# Patient Record
Sex: Female | Born: 1945 | Race: White | Hispanic: No | Smoking: Never smoker
Health system: Southern US, Community
[De-identification: ages and names within clinical notes are randomized; demographics above are authoritative.]

## PROBLEM LIST (undated history)

## (undated) DIAGNOSIS — K219 Gastro-esophageal reflux disease without esophagitis: Secondary | ICD-10-CM

## (undated) DIAGNOSIS — N824 Other female intestinal-genital tract fistulae: Secondary | ICD-10-CM

## (undated) DIAGNOSIS — G43909 Migraine, unspecified, not intractable, without status migrainosus: Secondary | ICD-10-CM

## (undated) DIAGNOSIS — K5790 Diverticulosis of intestine, part unspecified, without perforation or abscess without bleeding: Secondary | ICD-10-CM

## (undated) DIAGNOSIS — G8929 Other chronic pain: Secondary | ICD-10-CM

## (undated) DIAGNOSIS — Z8719 Personal history of other diseases of the digestive system: Secondary | ICD-10-CM

## (undated) DIAGNOSIS — Z9289 Personal history of other medical treatment: Secondary | ICD-10-CM

## (undated) DIAGNOSIS — R112 Nausea with vomiting, unspecified: Secondary | ICD-10-CM

## (undated) DIAGNOSIS — Z8489 Family history of other specified conditions: Secondary | ICD-10-CM

## (undated) DIAGNOSIS — Z8711 Personal history of peptic ulcer disease: Secondary | ICD-10-CM

## (undated) DIAGNOSIS — B9562 Methicillin resistant Staphylococcus aureus infection as the cause of diseases classified elsewhere: Secondary | ICD-10-CM

## (undated) DIAGNOSIS — L0390 Cellulitis, unspecified: Secondary | ICD-10-CM

## (undated) DIAGNOSIS — Z8679 Personal history of other diseases of the circulatory system: Secondary | ICD-10-CM

## (undated) DIAGNOSIS — H25019 Cortical age-related cataract, unspecified eye: Secondary | ICD-10-CM

## (undated) DIAGNOSIS — Z9889 Other specified postprocedural states: Secondary | ICD-10-CM

## (undated) DIAGNOSIS — I773 Arterial fibromuscular dysplasia: Secondary | ICD-10-CM

## (undated) DIAGNOSIS — F419 Anxiety disorder, unspecified: Secondary | ICD-10-CM

## (undated) DIAGNOSIS — R011 Cardiac murmur, unspecified: Secondary | ICD-10-CM

## (undated) DIAGNOSIS — Z8614 Personal history of Methicillin resistant Staphylococcus aureus infection: Secondary | ICD-10-CM

## (undated) DIAGNOSIS — G3184 Mild cognitive impairment, so stated: Secondary | ICD-10-CM

## (undated) DIAGNOSIS — N6019 Diffuse cystic mastopathy of unspecified breast: Secondary | ICD-10-CM

## (undated) DIAGNOSIS — E041 Nontoxic single thyroid nodule: Secondary | ICD-10-CM

## (undated) DIAGNOSIS — M1990 Unspecified osteoarthritis, unspecified site: Secondary | ICD-10-CM

## (undated) DIAGNOSIS — N904 Leukoplakia of vulva: Secondary | ICD-10-CM

## (undated) DIAGNOSIS — I10 Essential (primary) hypertension: Secondary | ICD-10-CM

## (undated) HISTORY — PX: OTHER SURGICAL HISTORY: SHX169

## (undated) HISTORY — PX: EYE SURGERY: SHX253

## (undated) HISTORY — PX: COLON SURGERY: SHX602

## (undated) HISTORY — PX: BREAST EXCISIONAL BIOPSY: SUR124

## (undated) HISTORY — PX: WRIST GANGLION EXCISION: SUR520

---

## 1965-11-01 HISTORY — PX: OTHER SURGICAL HISTORY: SHX169

## 1996-11-01 DIAGNOSIS — Z9289 Personal history of other medical treatment: Secondary | ICD-10-CM

## 1996-11-01 HISTORY — DX: Personal history of other medical treatment: Z92.89

## 1996-11-01 HISTORY — PX: ABDOMINAL HYSTERECTOMY: SHX81

## 2001-11-29 ENCOUNTER — Other Ambulatory Visit: Admission: RE | Admit: 2001-11-29 | Discharge: 2001-11-29 | Payer: Self-pay | Admitting: Family Medicine

## 2005-03-30 ENCOUNTER — Ambulatory Visit: Payer: Self-pay | Admitting: Internal Medicine

## 2005-03-30 IMAGING — MG MM CAD SCREENING MAMMO
1 series · 4 of 4 positions shown · non-contrast
Comparison: none

REASON FOR EXAM: Screening
COMMENTS:

[R CC · right · 4 of 4 slices shown]
[im 1/4]
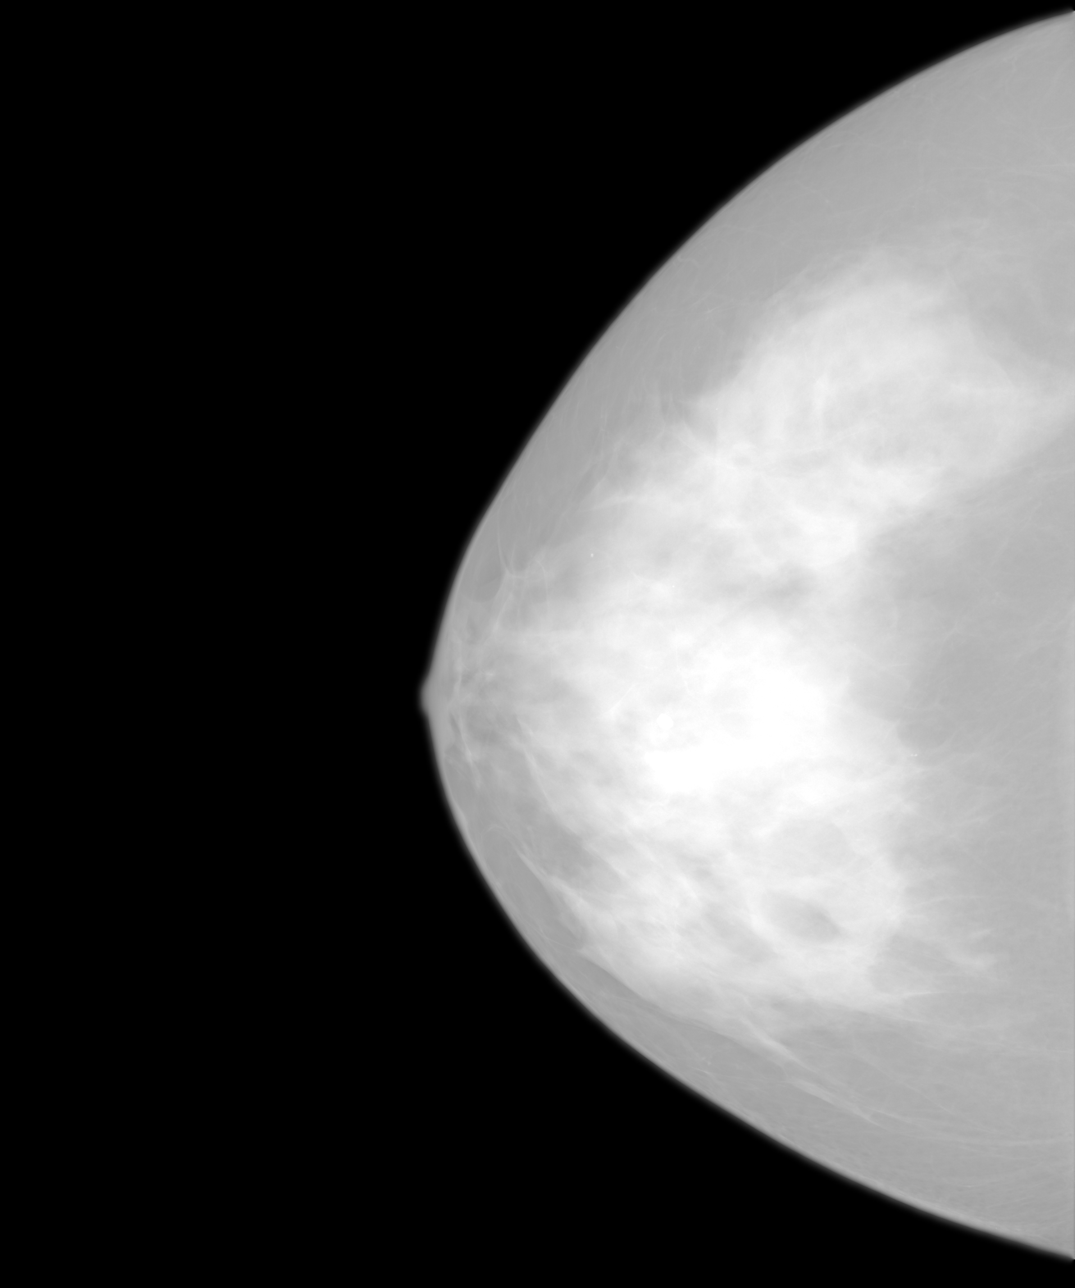
[im 2/4]
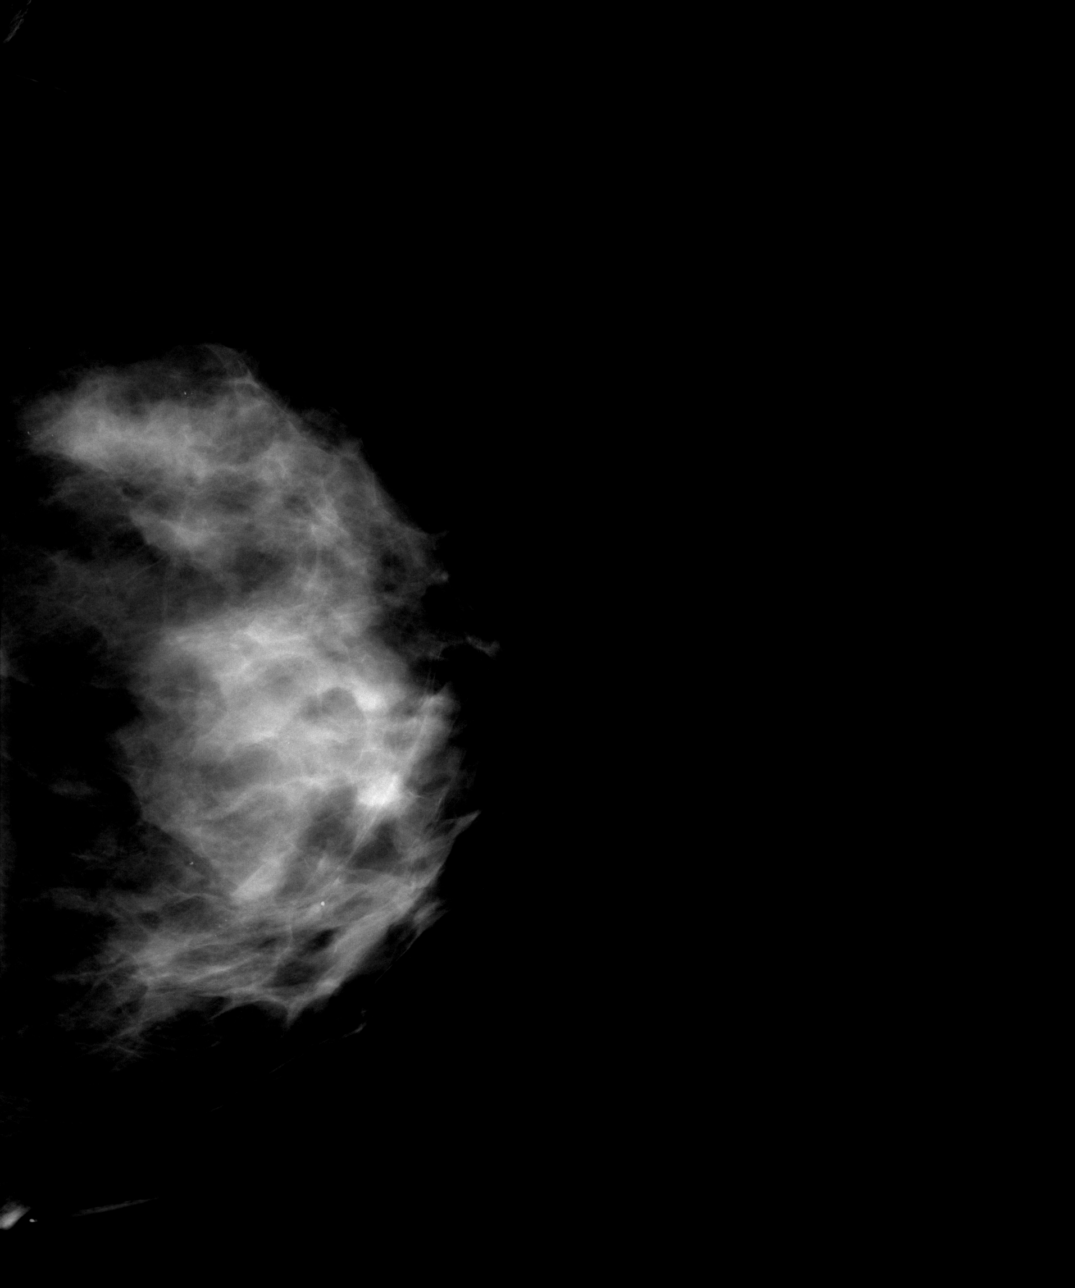
[im 3/4]
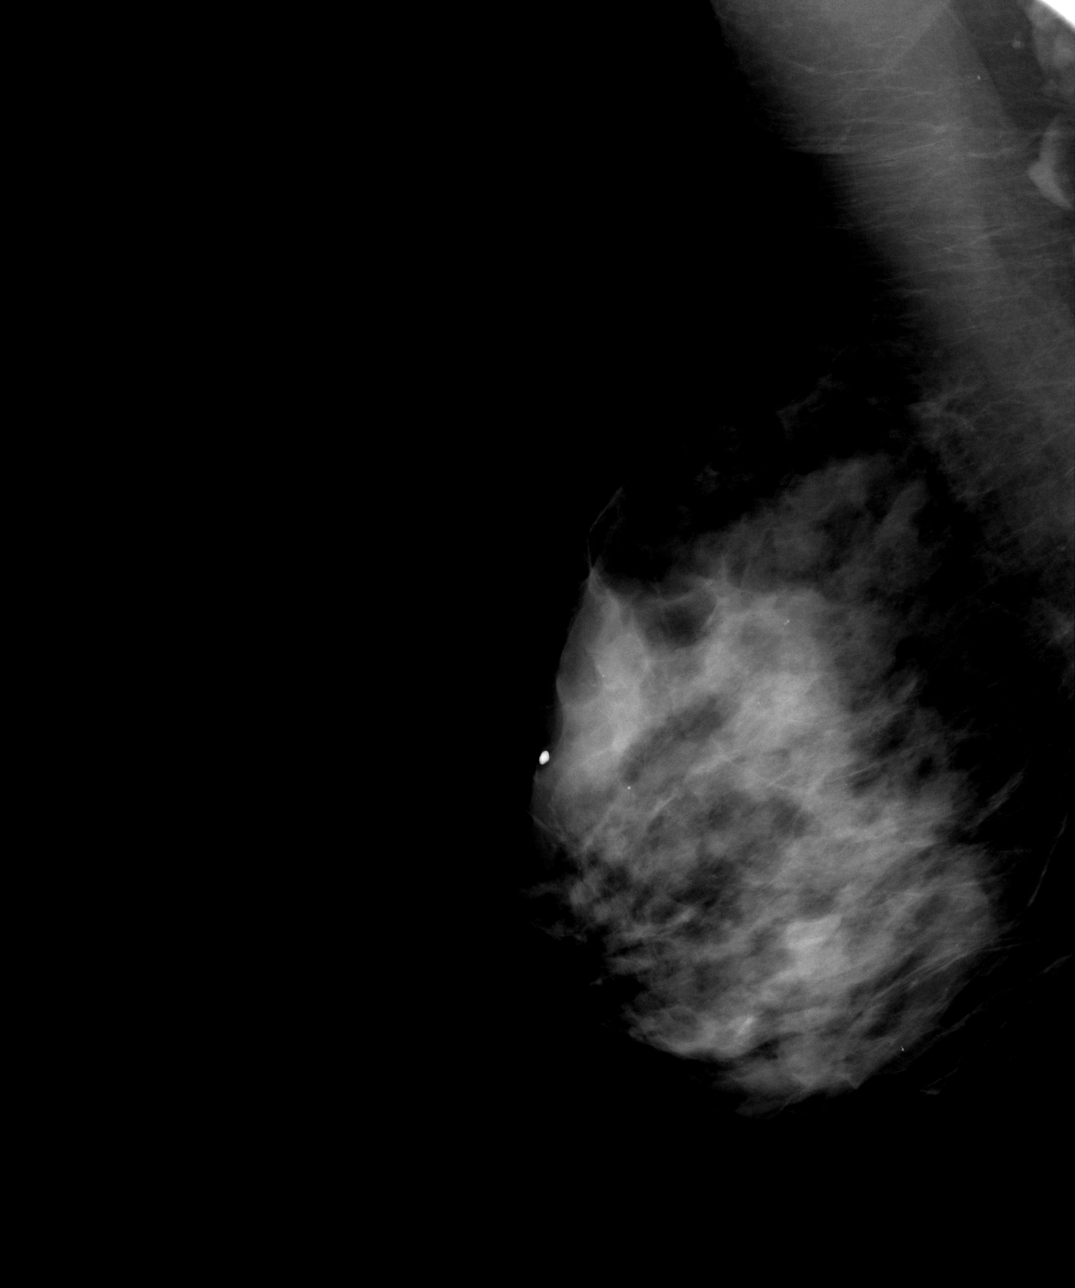
[im 4/4]
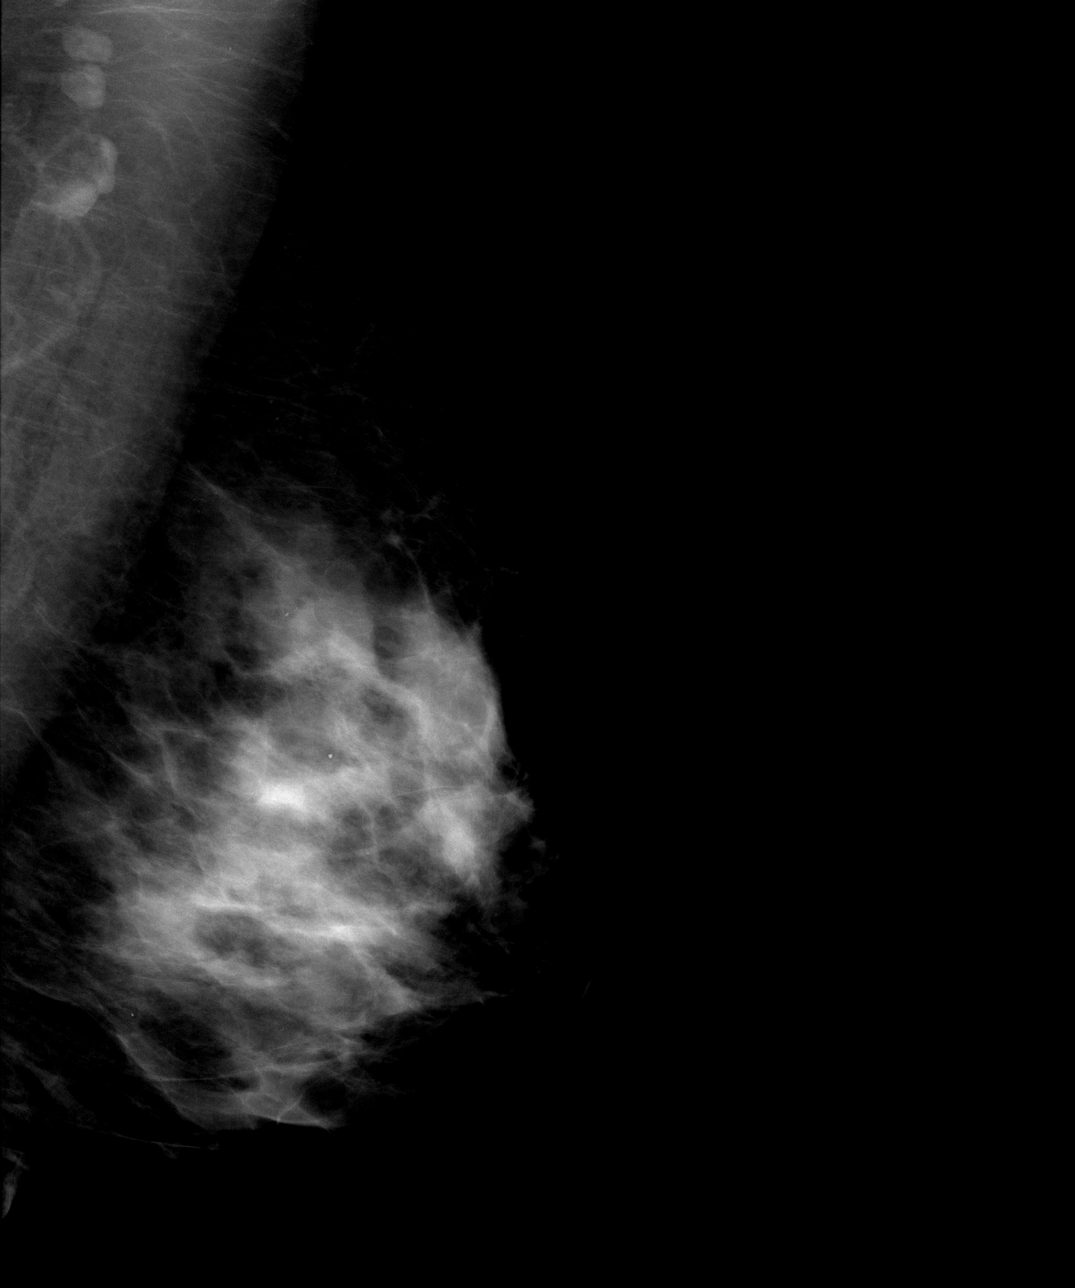

[4 of 4 positions shown; findings below may reference images not displayed]

PROCEDURE:     MAM - MAM DGTL SCREENING MAMMO W/CAD  - [DATE]  [DATE]

RESULT:     The patient is 59 years old. She has a weak family but no
personal history of breast cancer. She does not take hormones or have
complaints today.

The mammogram was performed and compared with prior studies from [DATE]
and [DATE].

The breasts show a mixture of dense fibroglandular tissue and fatty
replacement. Scattered, benign calcifications are again identified in both
breasts. They are unchanged from previous studies. There is no evidence of a
dominant stellate mass, architectural distortion or microcalcification to
suspect malignancy.
IMPRESSION: Stable mammogram. No change since prior studies.

BI-RADS:  Category 2 - Benign Imaging Findings

RECOMMENDATION:  The patient should return for bilateral screening mammogram
in 1 year.

A NEGATIVE MAMMOGRAM REPORT DOES NOT PRECLUDE BIOPSY OR OTHER EVALUATION OF
CLINICALLY PALPABLE OR OTHERWISE SUSPICIOUS MASS OR LESION.  BREAST CANCER
MAY NOT BE DETECTED BY MAMMOGRAPHY IN UP TO 10% OF CASES.

## 2005-04-01 ENCOUNTER — Ambulatory Visit: Payer: Self-pay | Admitting: Gastroenterology

## 2006-07-07 ENCOUNTER — Ambulatory Visit: Payer: Self-pay | Admitting: Internal Medicine

## 2006-07-07 IMAGING — MG MM CAD SCREENING MAMMO
1 series · 4 of 4 positions shown · non-contrast
Comparison: none

REASON FOR EXAM: screening mammo
COMMENTS:

[Series 7854: R CC · right · 4 of 4 slices shown]
[im 1/4]
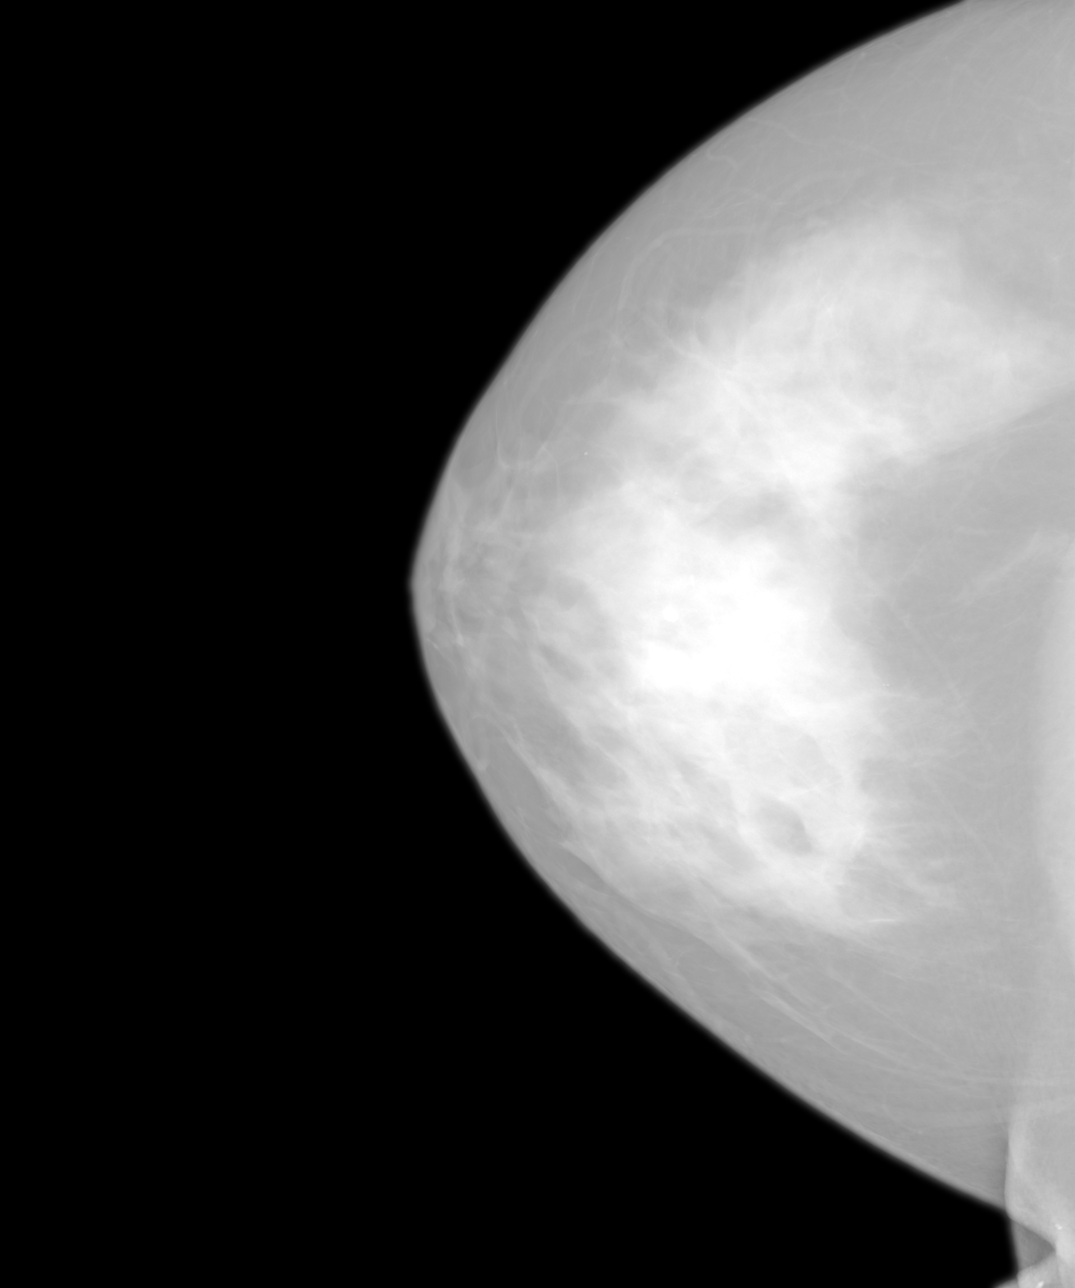
[im 2/4]
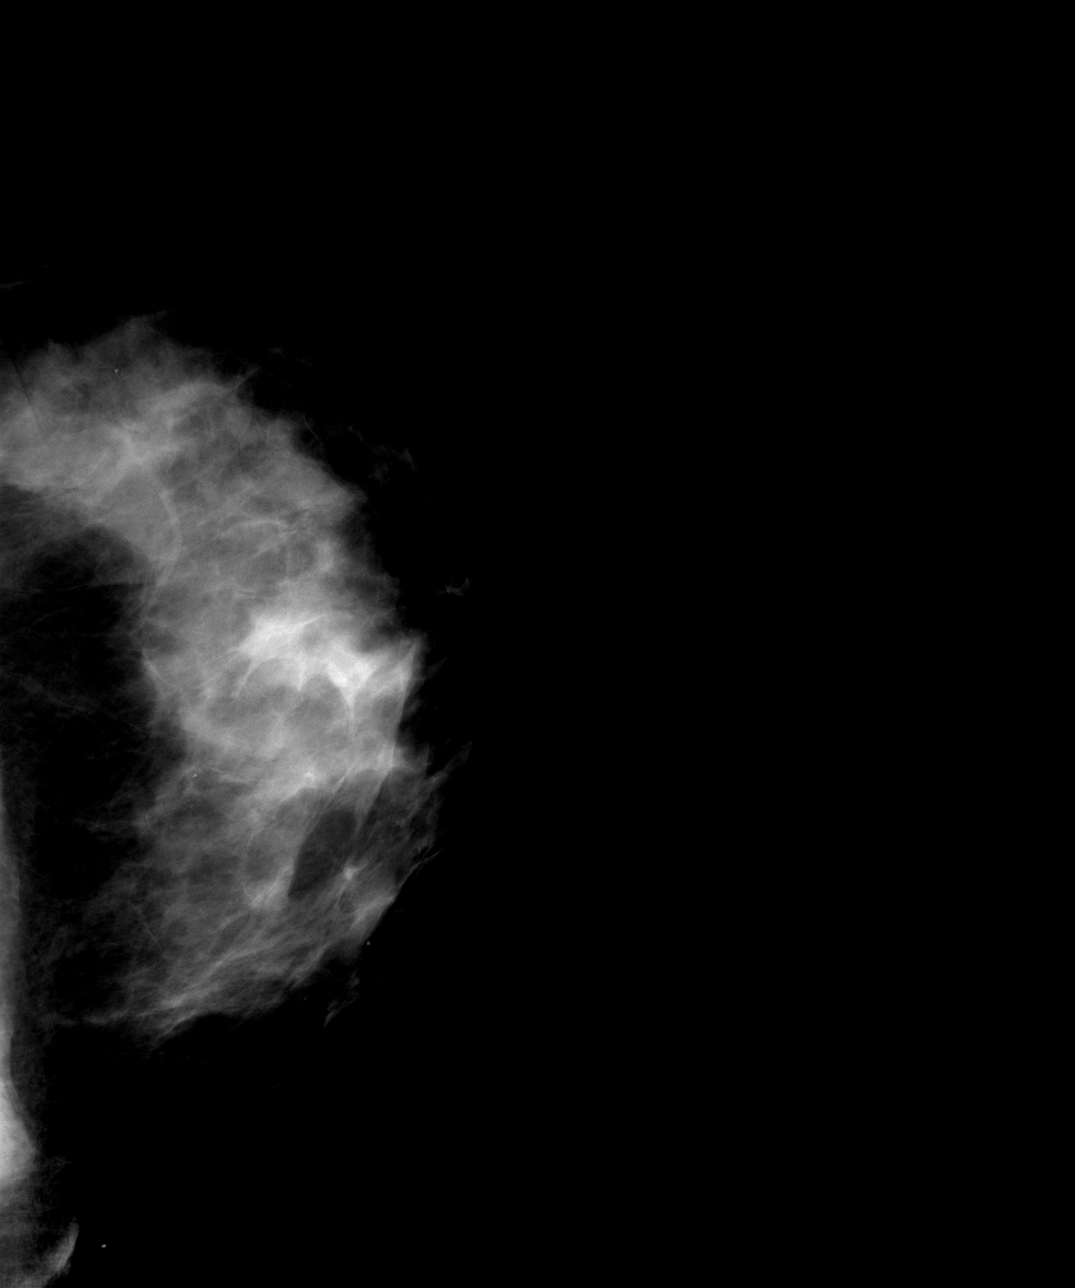
[im 3/4]
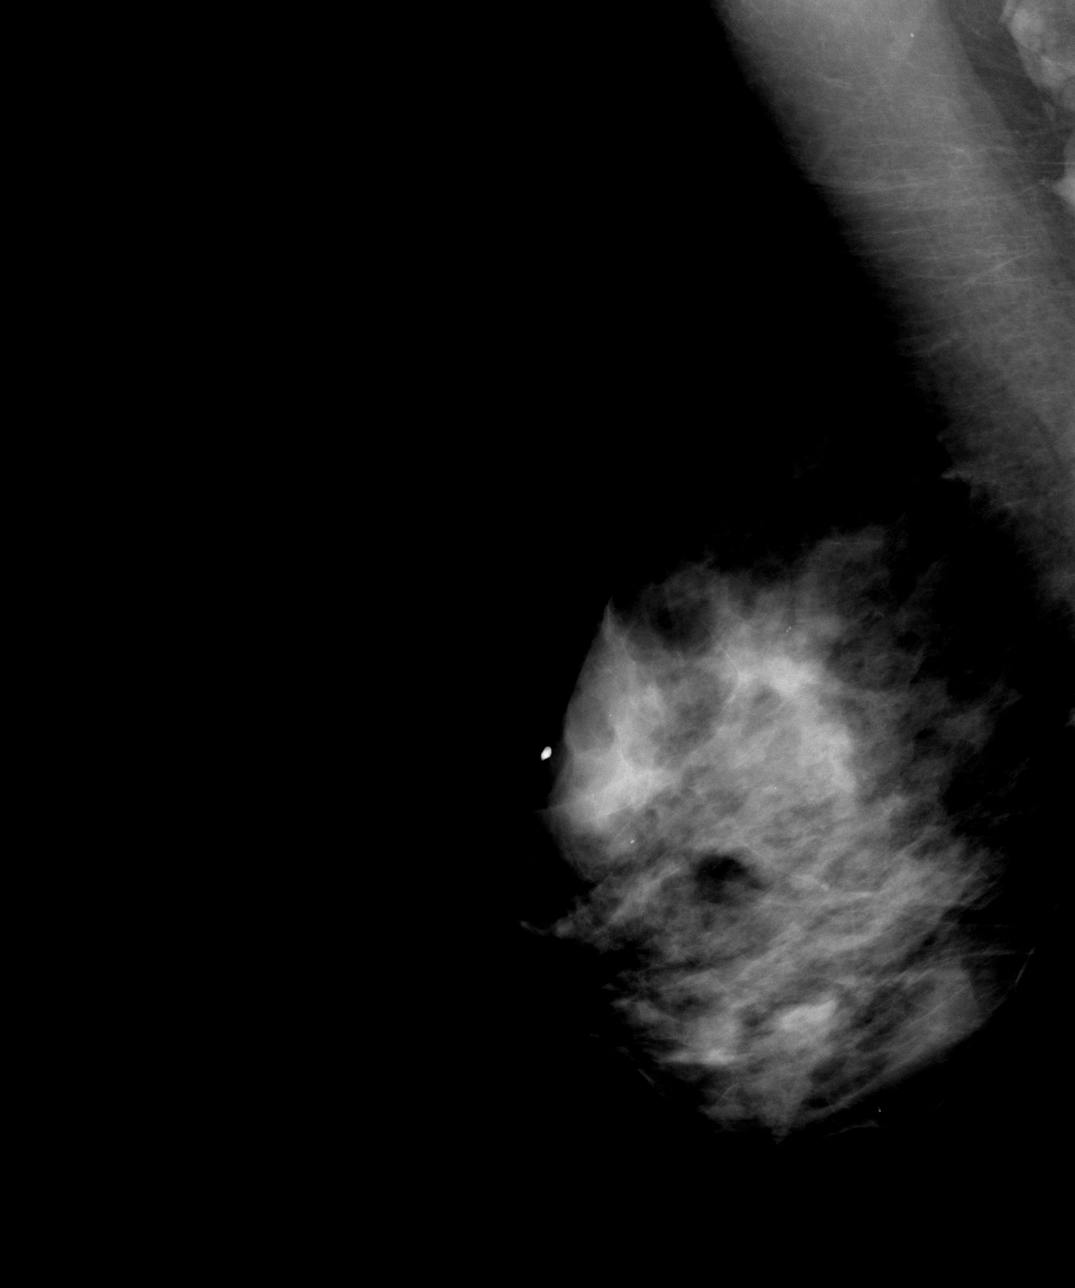
[im 4/4]
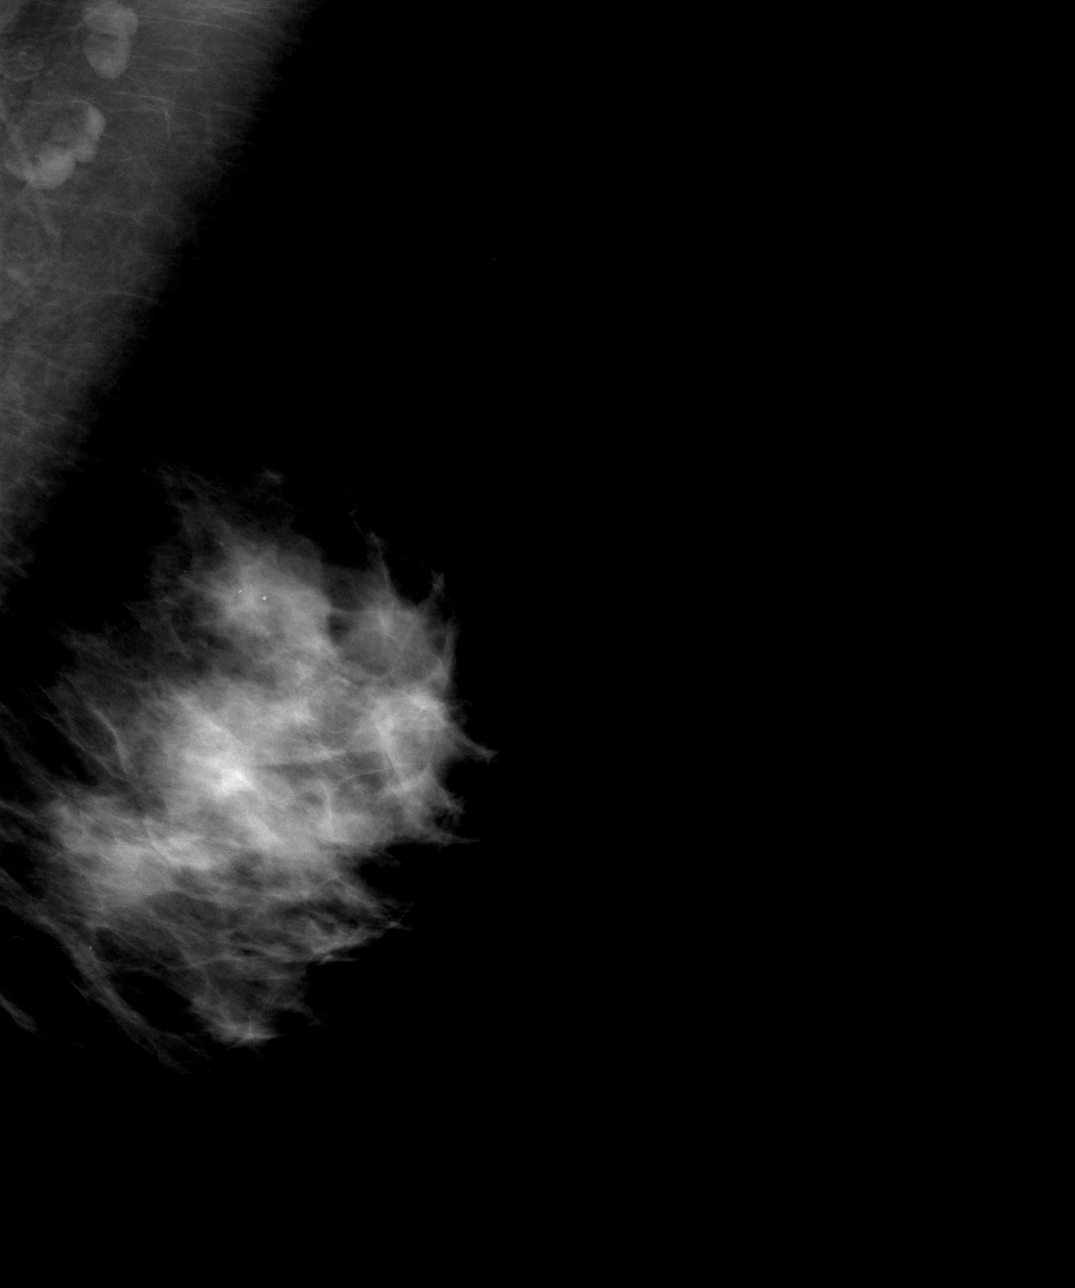

[4 of 4 positions shown; findings below may reference images not displayed]

PROCEDURE:     MAM - MAM DGTL SCREENING MAMMO W/CAD  - [DATE]  [DATE]

RESULT:     Comparison is made to the study of film screen study of [DATE]
and to digital images done previously on [DATE] and [DATE].  The breasts
exhibit a dense parenchymal pattern.  There is no evidence of an area of
developing density, dominant mass or malignant-appearing calcification.  The
appearance is stable.
IMPRESSION: Stable, benign-appearing bilateral mammogram.

BI-RADS Category 2  Benign Findings.  Please continue to encourage annual
mammographic followup..

A NEGATIVE MAMMOGRAM REPORT DOES NOT PRECLUDE BIOPSY OR OTHER EVALUATION OF
A CLNICALLY PALPABLE OR OTHERWISE SUSPICIOUS MASS OR LESION.  BREAST CANCER
MAY NOT BE DETECTED BY MAMMOGRAPHY IN UP TO 10% OF CASES.

## 2007-09-13 ENCOUNTER — Ambulatory Visit: Payer: Self-pay | Admitting: Internal Medicine

## 2007-09-13 IMAGING — MG MM CAD SCREENING MAMMO
1 series · 4 of 4 positions shown · non-contrast
Comparison: none

REASON FOR EXAM: scr mammo
COMMENTS:

[Series 2327: R CC · right · 4 of 4 slices shown]
[im 1/4]
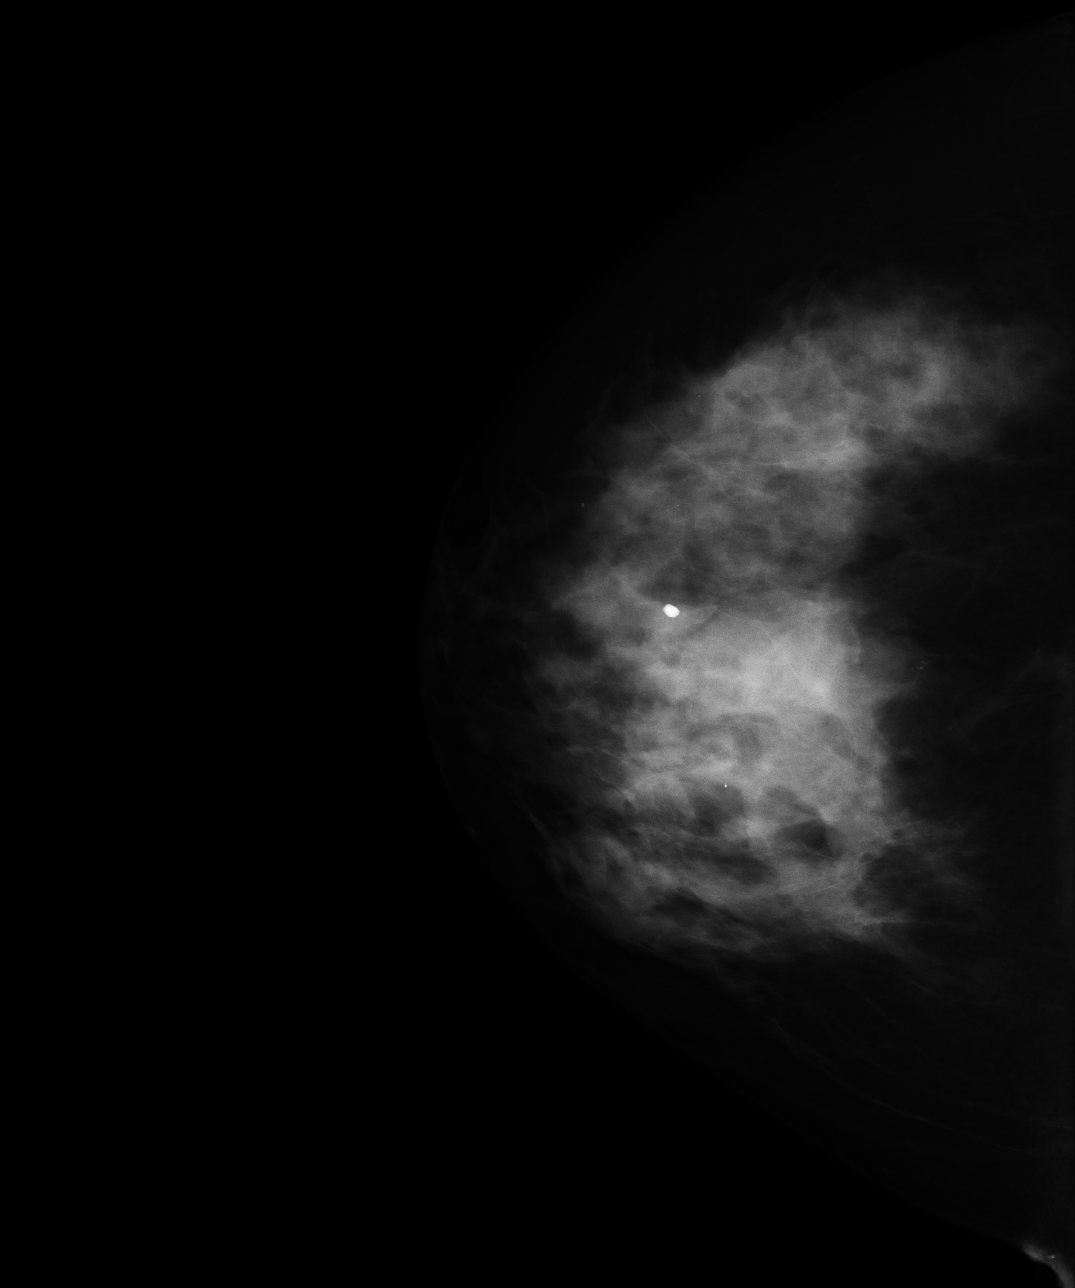
[im 2/4]
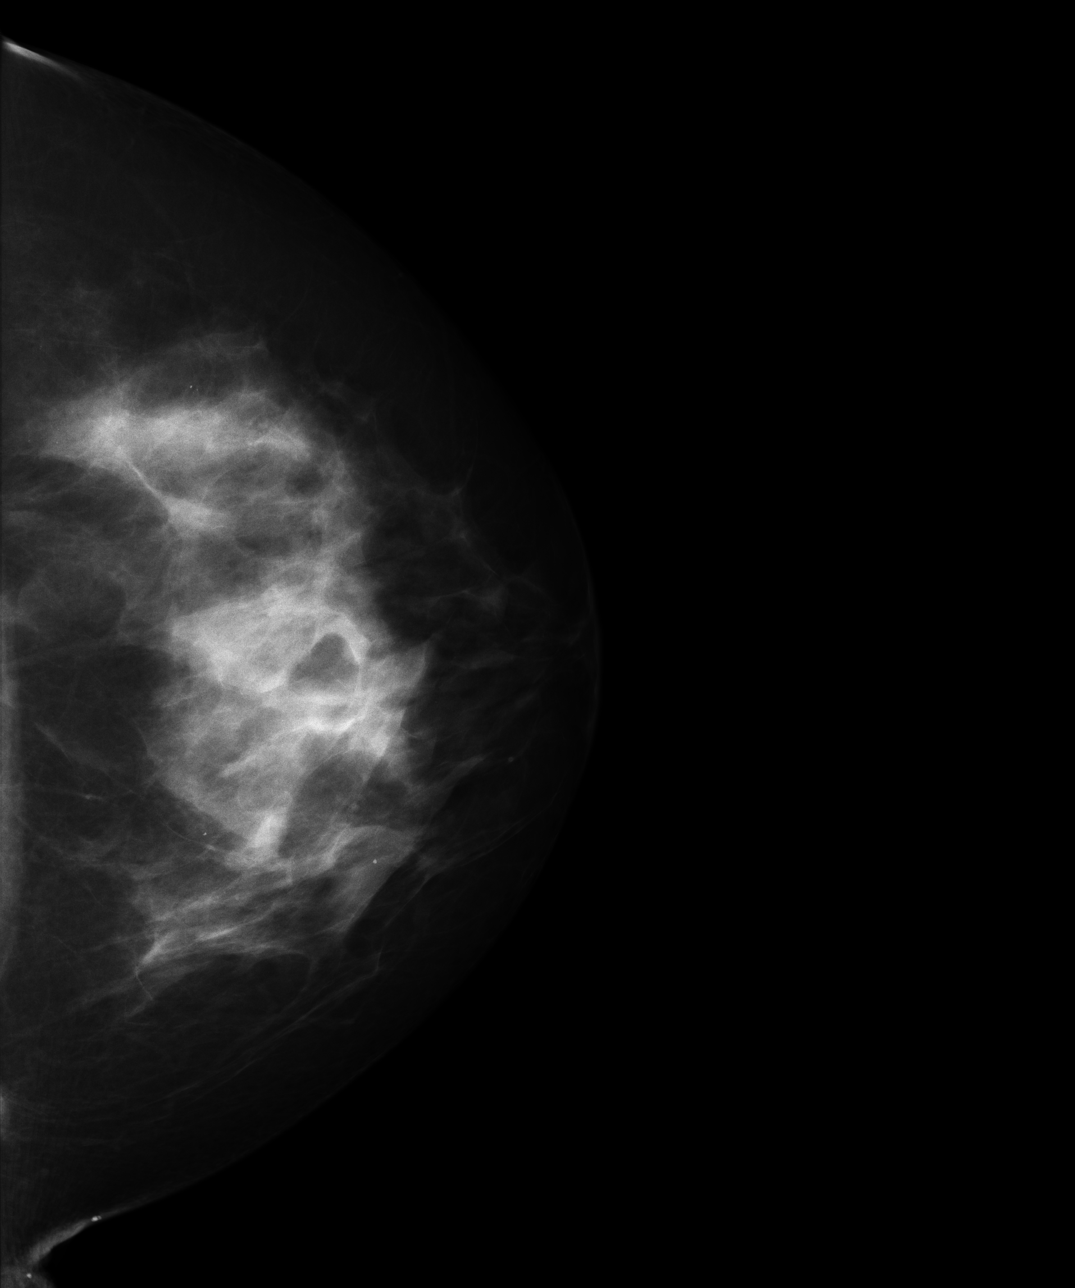
[im 3/4]
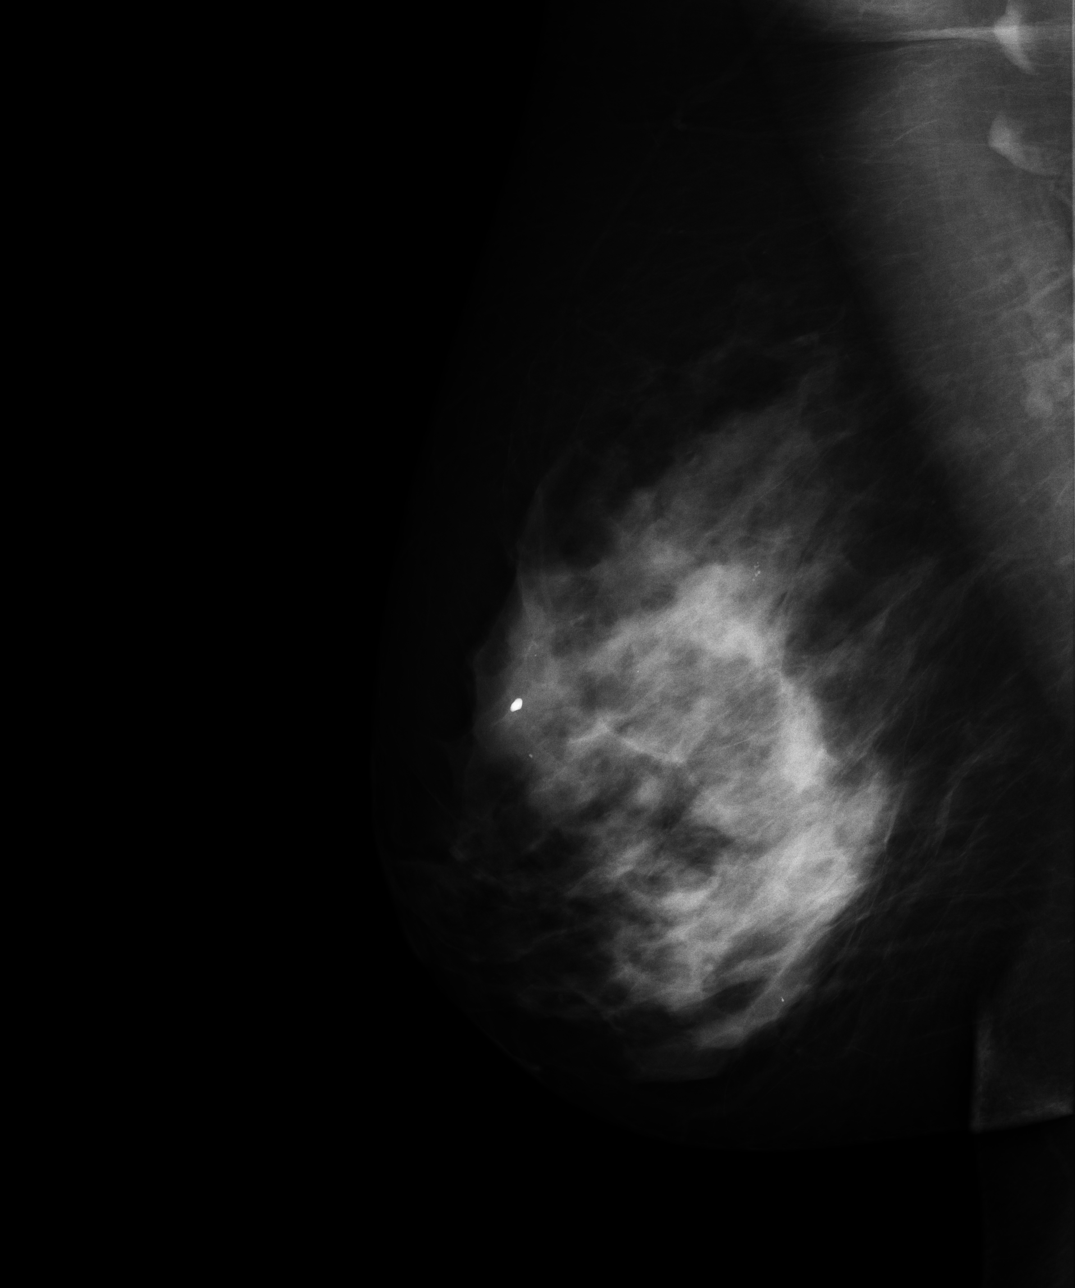
[im 4/4]
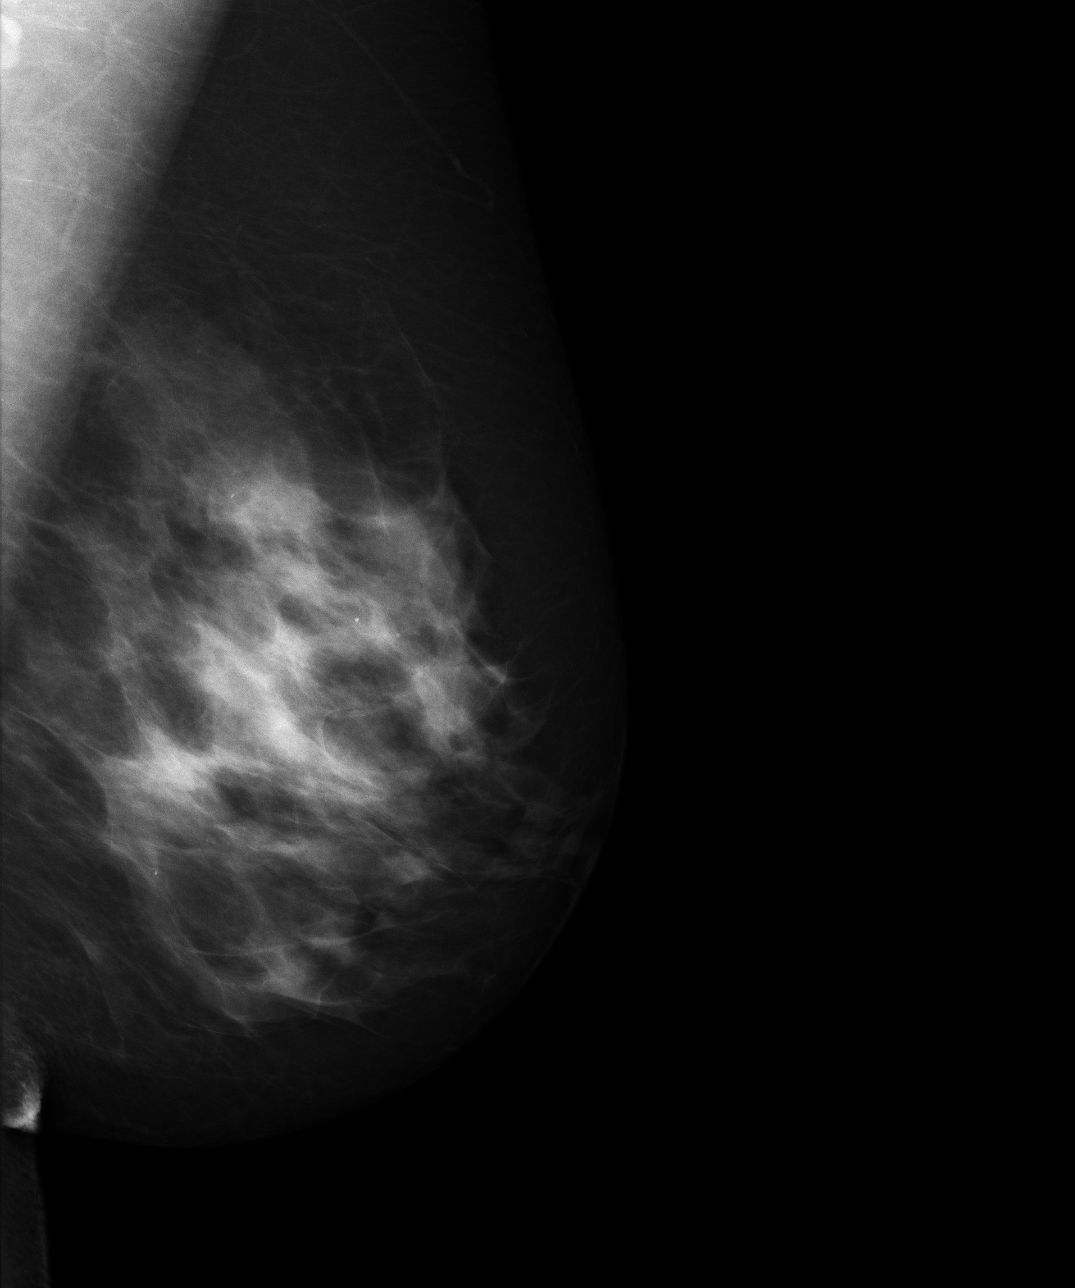

[4 of 4 positions shown; findings below may reference images not displayed]

PROCEDURE:     MAM - MAM DGTL SCREENING MAMMO W/CAD  - [DATE]  [DATE]

RESULT:     Comparison is made to film screening images of [DATE] and to
digital images of [DATE], as well as [DATE] and [DATE].  The breasts
exhibit a dense parenchymal pattern with no evidence of an area of
developing density, dominant mass or malignant appearing calcification. The
appearance is stable.
IMPRESSION: 1)Stable, benign appearing bilateral mammogram.

BI-RADS: Category 2-Benign Finding (s)

RECOMMENDATIONS

1)Please continue to encourage annual mammographic follow-up.

A NEGATIVE MAMMOGRAM REPORT DOES NOT PRECLUDE BIOPSY OR OTHER EVALUATION OF
A CLINICALLY PALPABLE OR OTHERWISE SUSPICIOUS MASS OR LESION. BREAST CANCER
MAY NOT BE DETECTED BY MAMMOGRAPHY IN UP TO 10% OF CASES.

## 2007-11-02 HISTORY — PX: OTHER SURGICAL HISTORY: SHX169

## 2008-09-17 ENCOUNTER — Ambulatory Visit: Payer: Self-pay | Admitting: Internal Medicine

## 2008-09-17 IMAGING — MG MM CAD SCREENING MAMMO
1 series · 4 of 4 positions shown · non-contrast
Comparison: none

REASON FOR EXAM: Screening mammogram
COMMENTS:

[R CC · right · 4 of 4 slices shown]
[im 1/4]
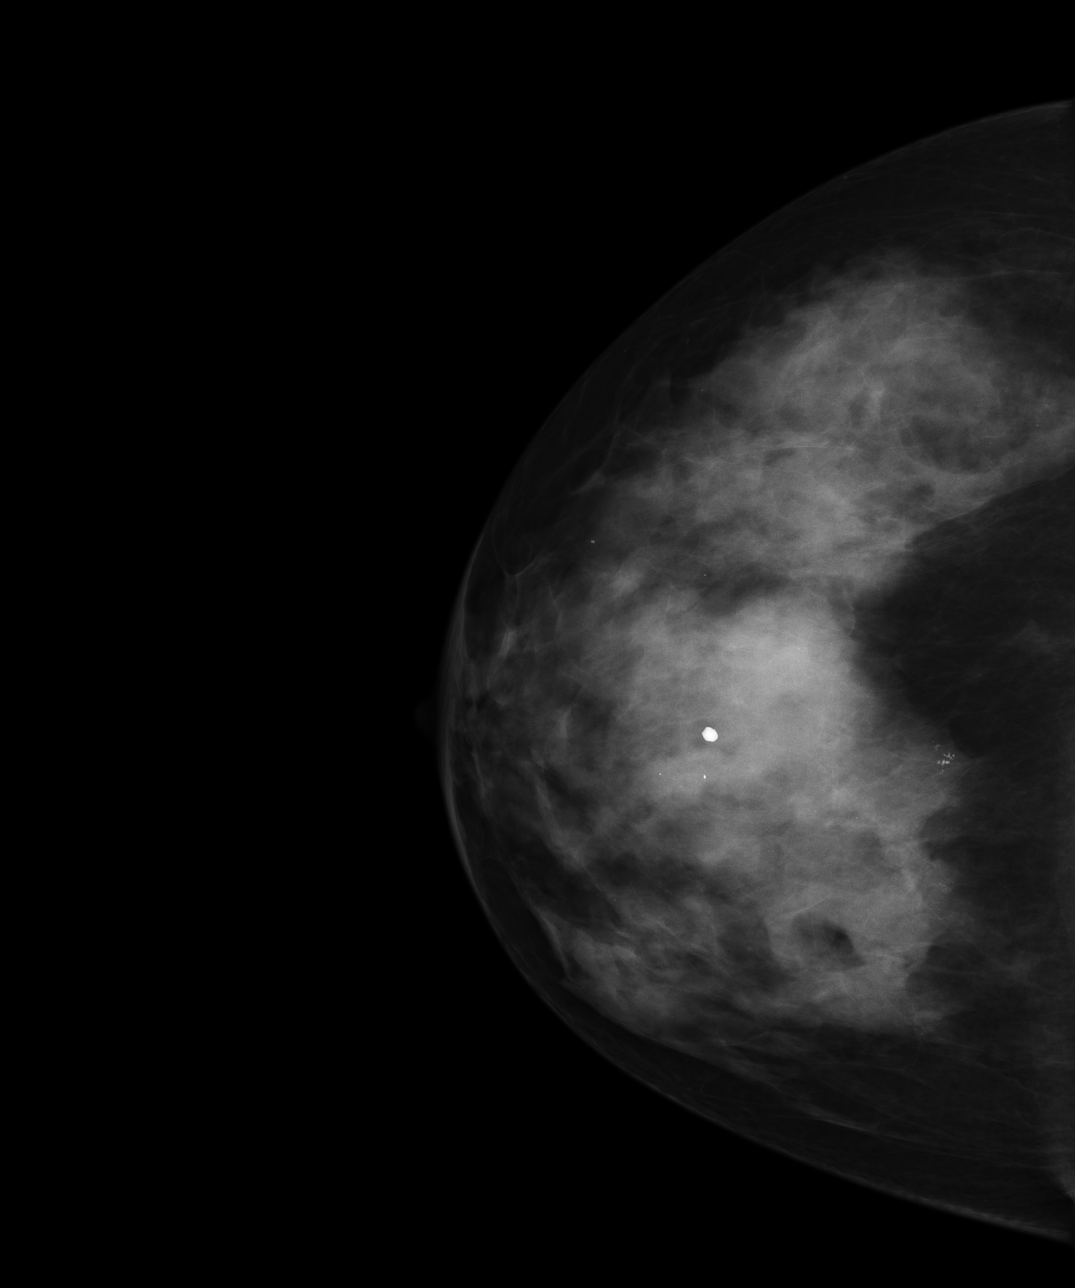
[im 2/4]
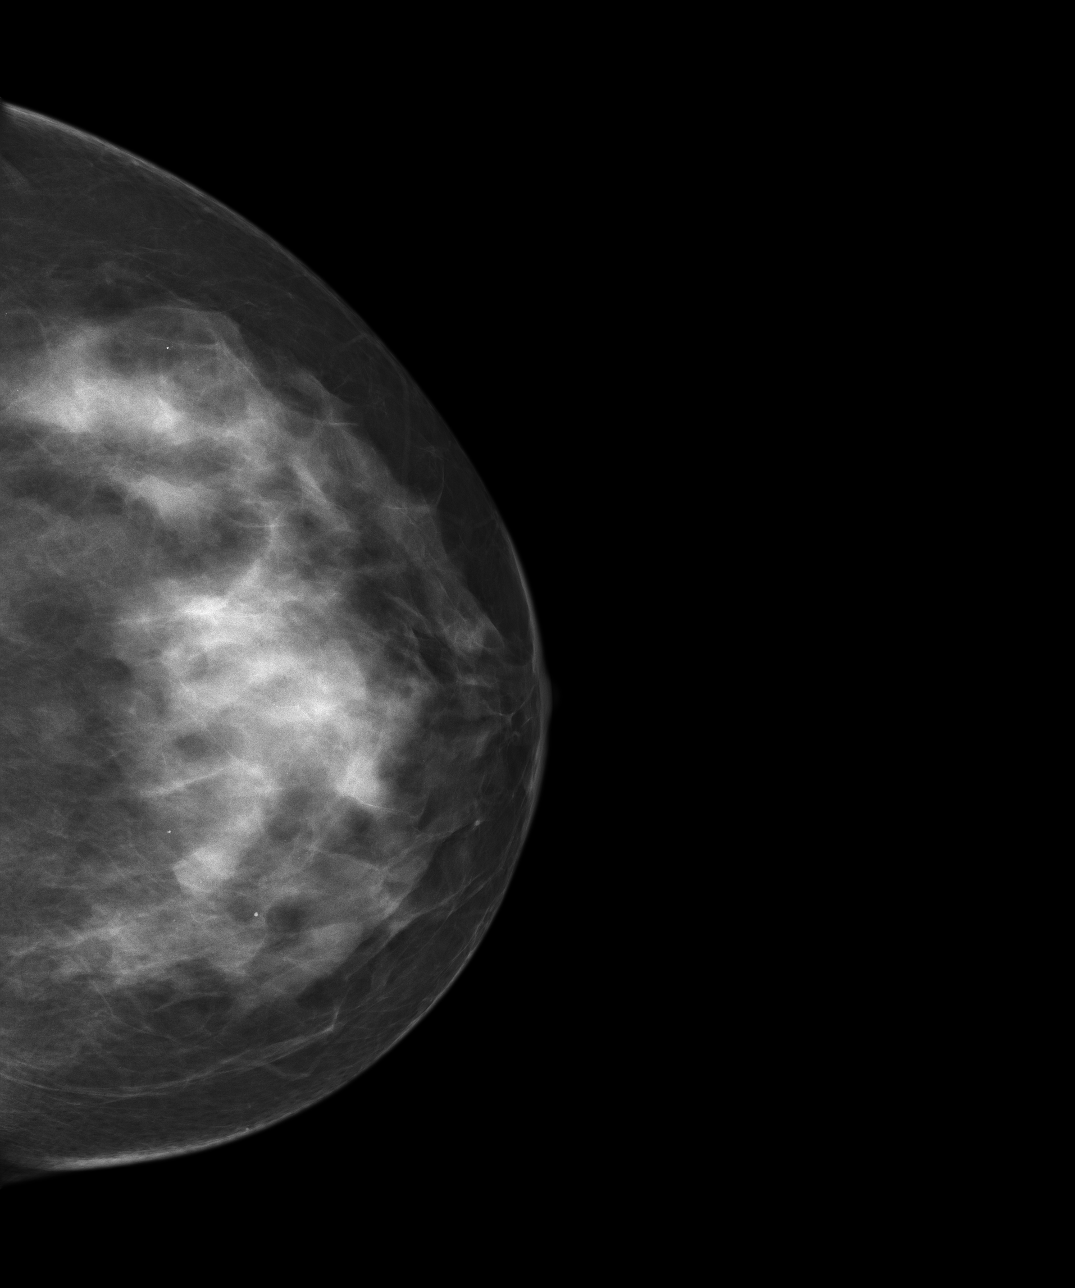
[im 3/4]
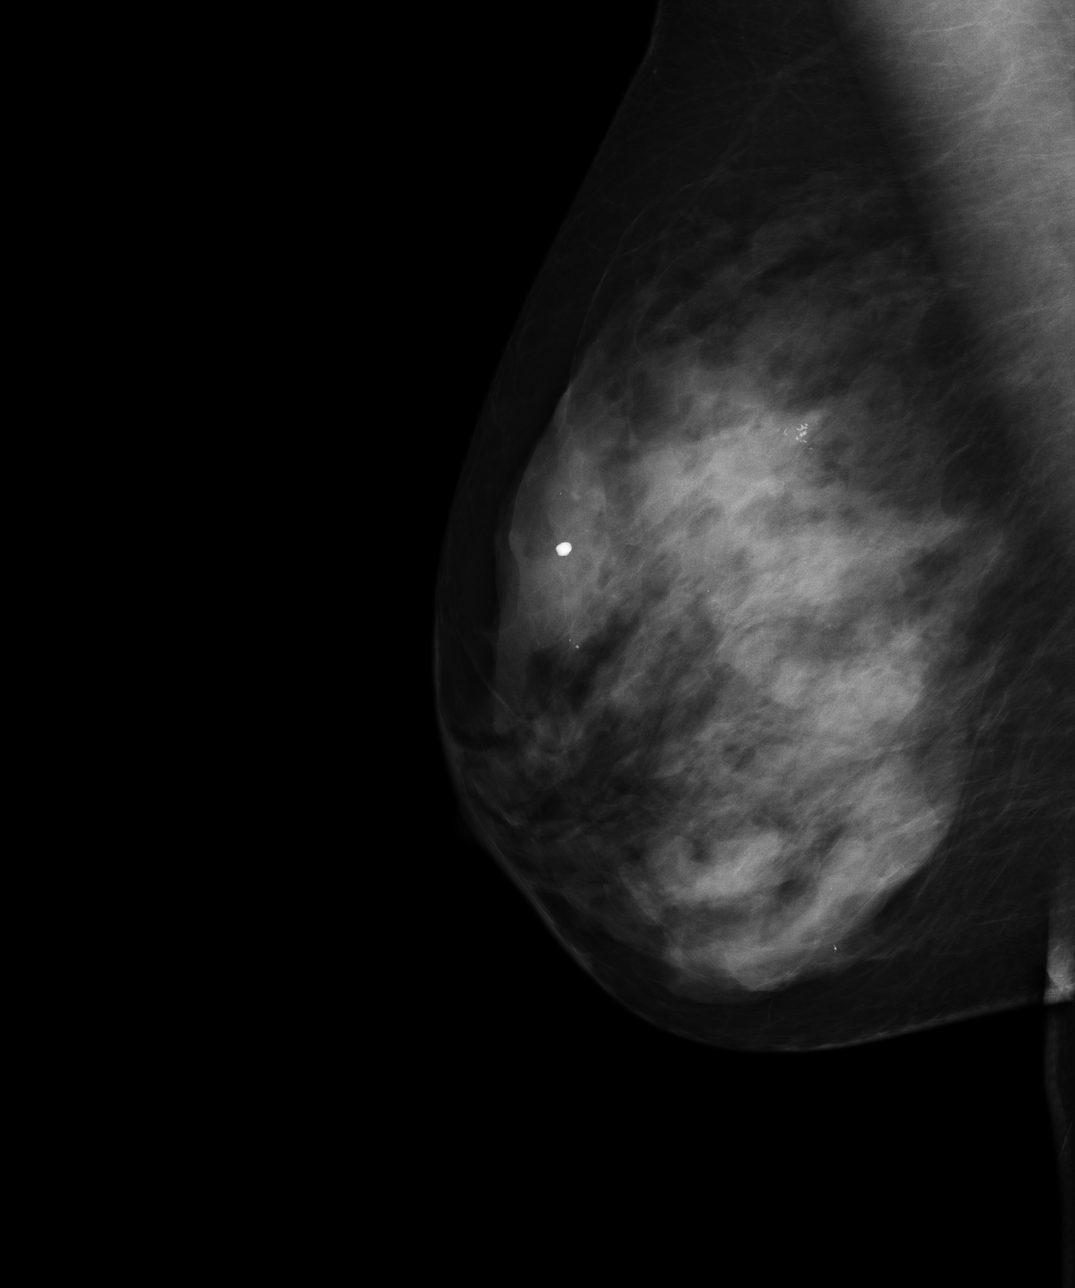
[im 4/4]
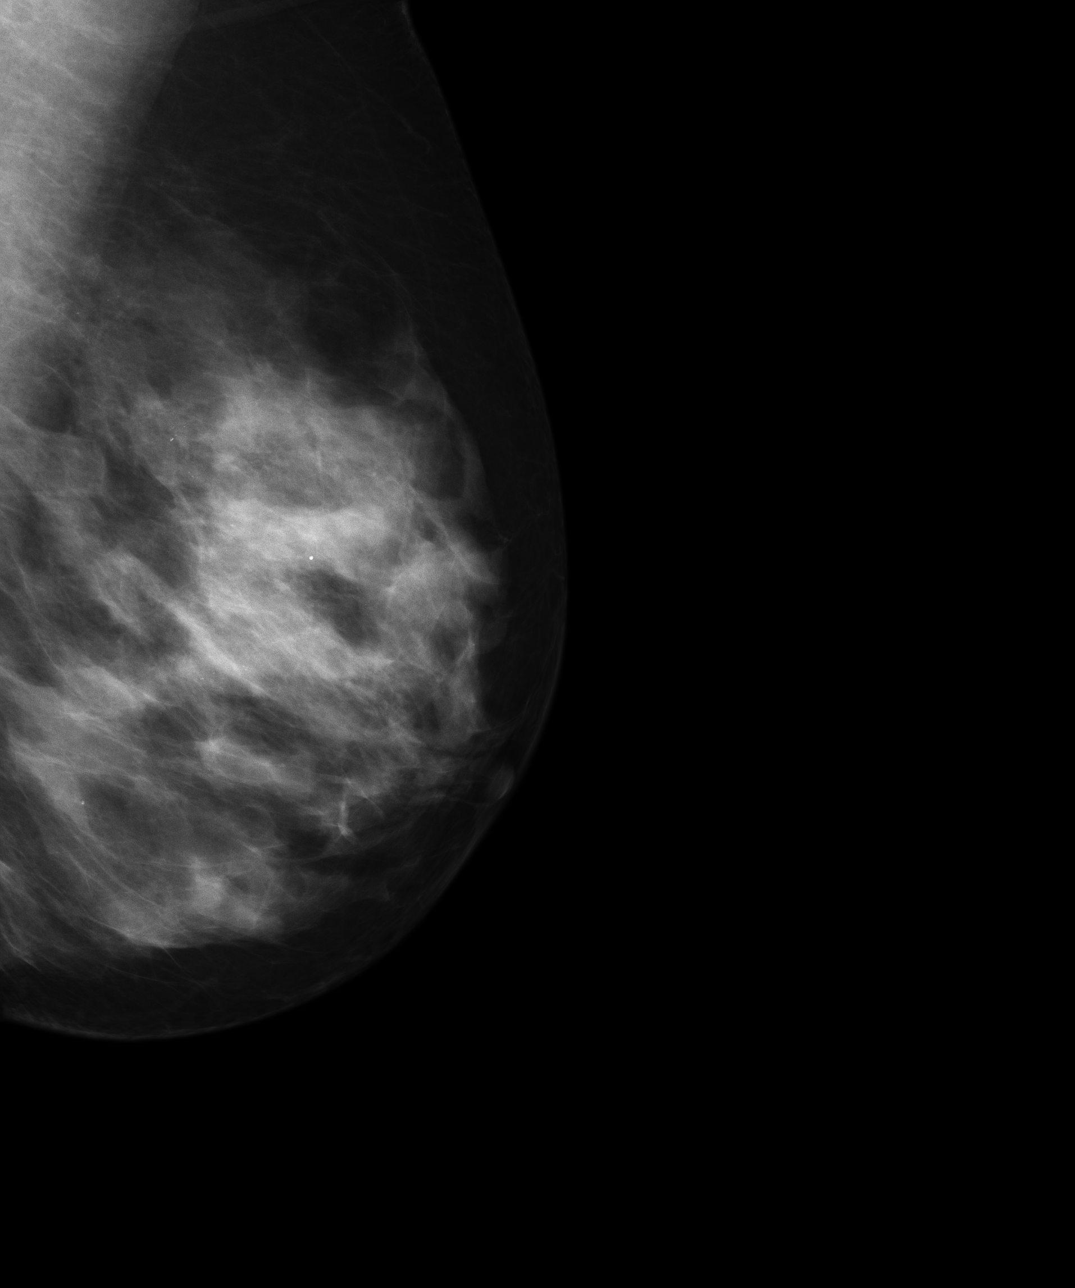

[4 of 4 positions shown; findings below may reference images not displayed]

PROCEDURE:     MAM - MAM DGTL SCREENING MAMMO W/CAD  - [DATE]  [DATE]

RESULT:     Comparison is made to the previous film screen images of
[DATE] and to previous digital images dated [DATE] as well as
[DATE] and [DATE].

There is a stable, benign appearing calcification in the RIGHT breast in the
anterior upper central region. There are prominent calcifications in the
posterior superior medial aspect of the RIGHT breast for which surgical
consultation is recommended. These are indeterminate. Consideration for
localization and biopsy or Stereotactic biopsy could be given. There is a
stable, subtle calcification along the medial aspect of the RIGHT breast in
the CC projection. This appears to be located superiorly but there is also
some calcification inferiorly at the same level on the MLO projection.

The LEFT breast has some scattered, benign appearing calcifications. Both
breasts exhibit a dense parenchymal pattern.
IMPRESSION: Nonspecific, calcifications in the upper medial aspect of
the RIGHT breast. Surgical consultation with consideration for localization
and lumpectomy or Stereotactic biopsy for further investigation is
suggested. These are indeterminate mammographically. Stable calcifications
otherwise.

BI-RADS: Category 4 - Suspicious Abnormality

RECOMMENDATION:  Please continue to encourage annual mammographic follow-up.
The dense breast parenchyma limits sensitivity for early mammographic
evidence of malignancy.

## 2008-10-17 ENCOUNTER — Ambulatory Visit: Payer: Self-pay | Admitting: Surgery

## 2008-10-17 IMAGING — MG MM BREAST SURGICAL SPECIMEN
1 series · 1 of 1 positions shown · non-contrast
Comparison: none

REASON FOR EXAM: spec
COMMENTS:

PROCEDURE:     MAM - MAM BREAST SPECIMEN  - [DATE]  [DATE]
RESULT:      Specimen mammogram reveals a cluster of microcalcifications and
Kopans' hook wire.

[R CC]
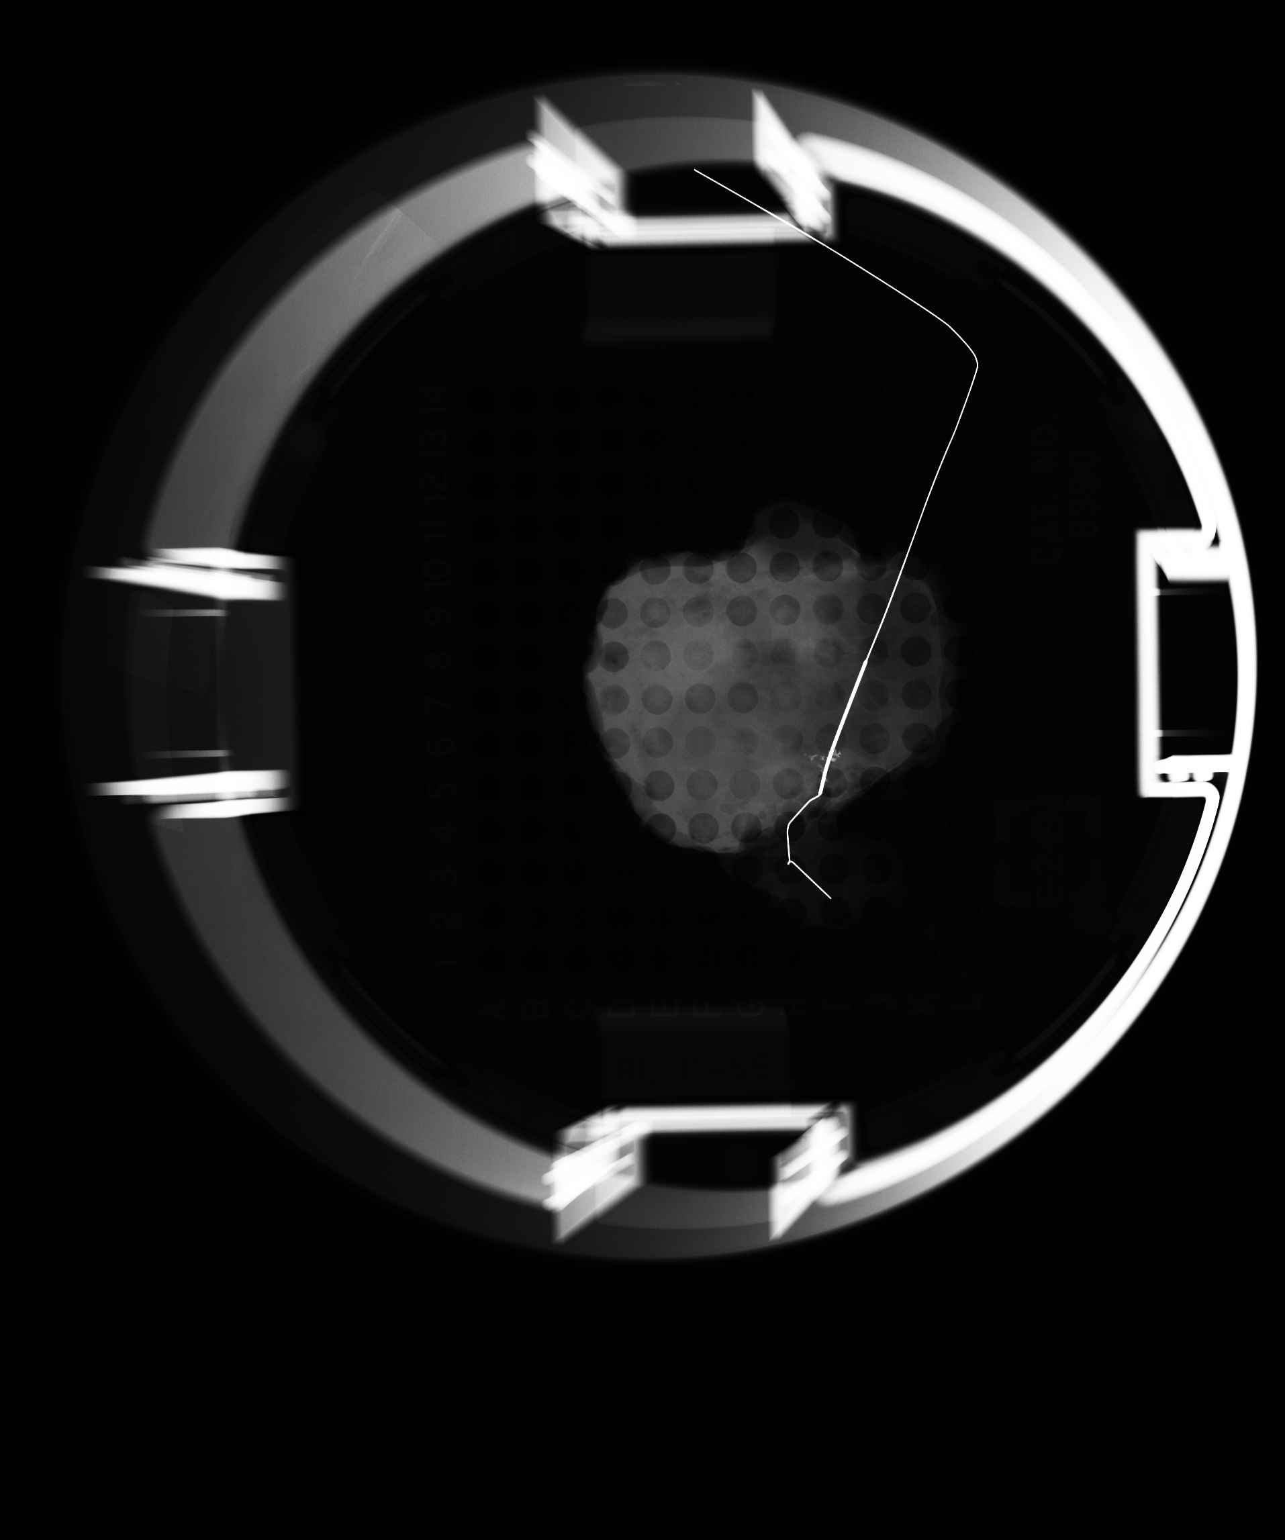

[1 of 1 positions shown; findings below may reference images not displayed]

IMPRESSION: Successful RIGHT breast biopsy.

## 2008-10-17 IMAGING — MG MM BREAST NEEDLE LOCALIZATION*R*
1 series · 6 of 6 positions shown · non-contrast
Comparison: none

REASON FOR EXAM: right breast microcalcification    SURG AT 11AM   [DATE]
COMMENTS:

PROCEDURE:     MAM - MAM BREAST NEEDLE LOCAL RT  - [DATE]  [DATE]
RESULT:       Needle localization was performed of the RIGHT breast cluster
of microcalcifications with placement of a Kopans' hook wire. Hook wire was
placed adjacent to the microcalcifications. There were no complications.

[R ML · right · 6 of 6 slices shown]
[im 1/6]
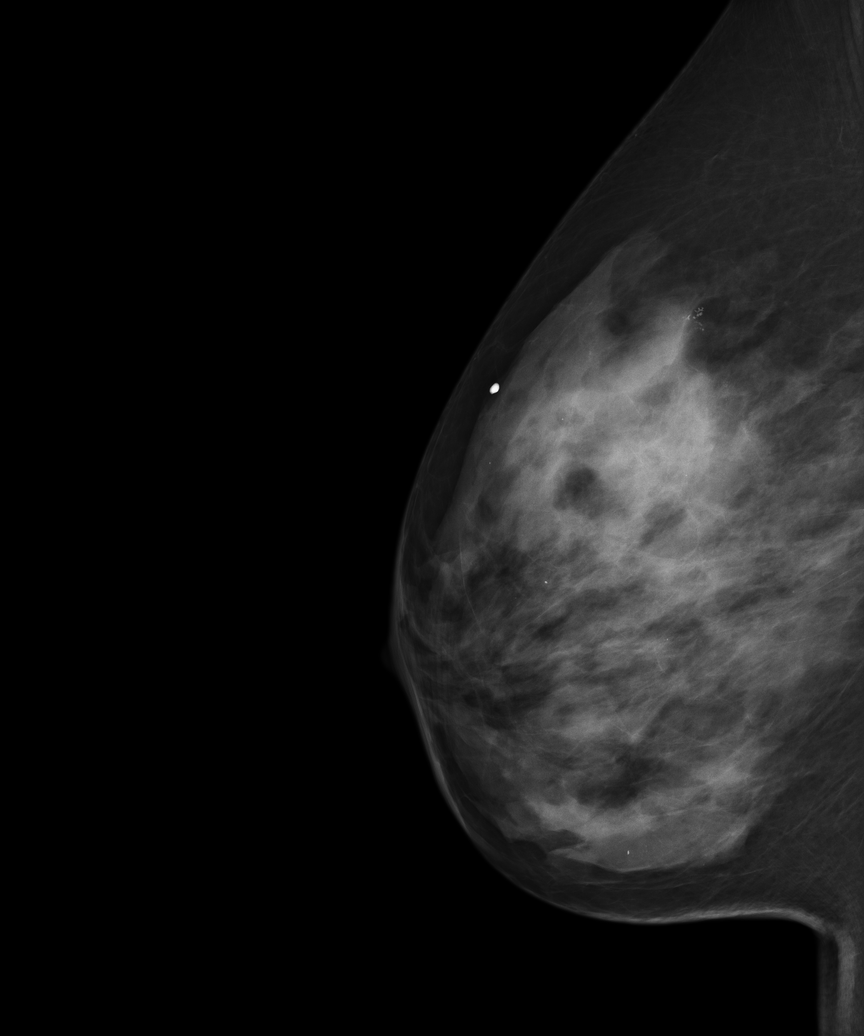
[im 2/6]
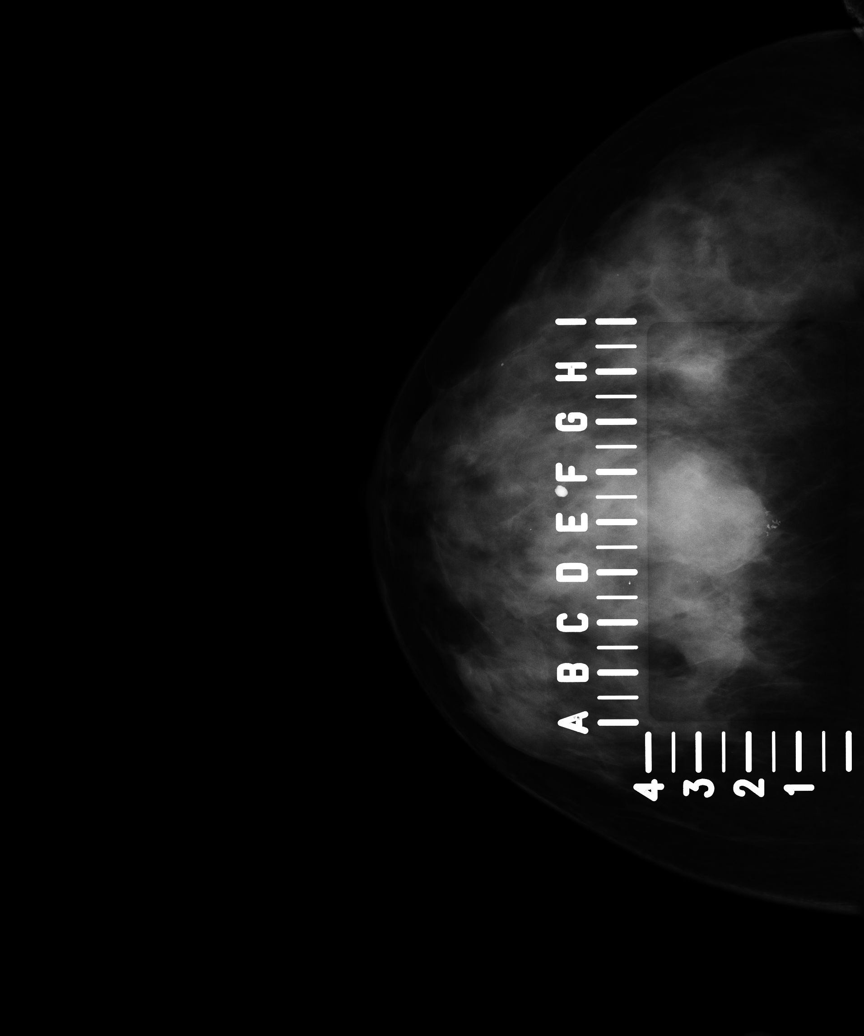
[im 3/6]
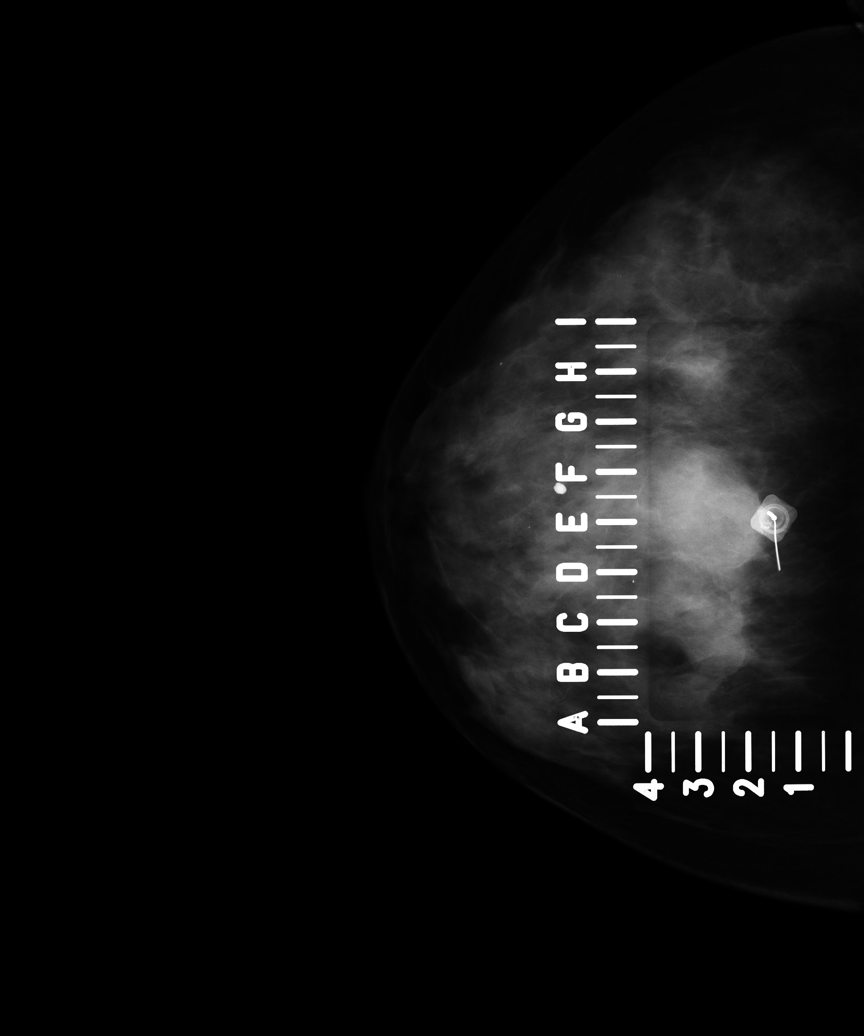
[im 4/6]
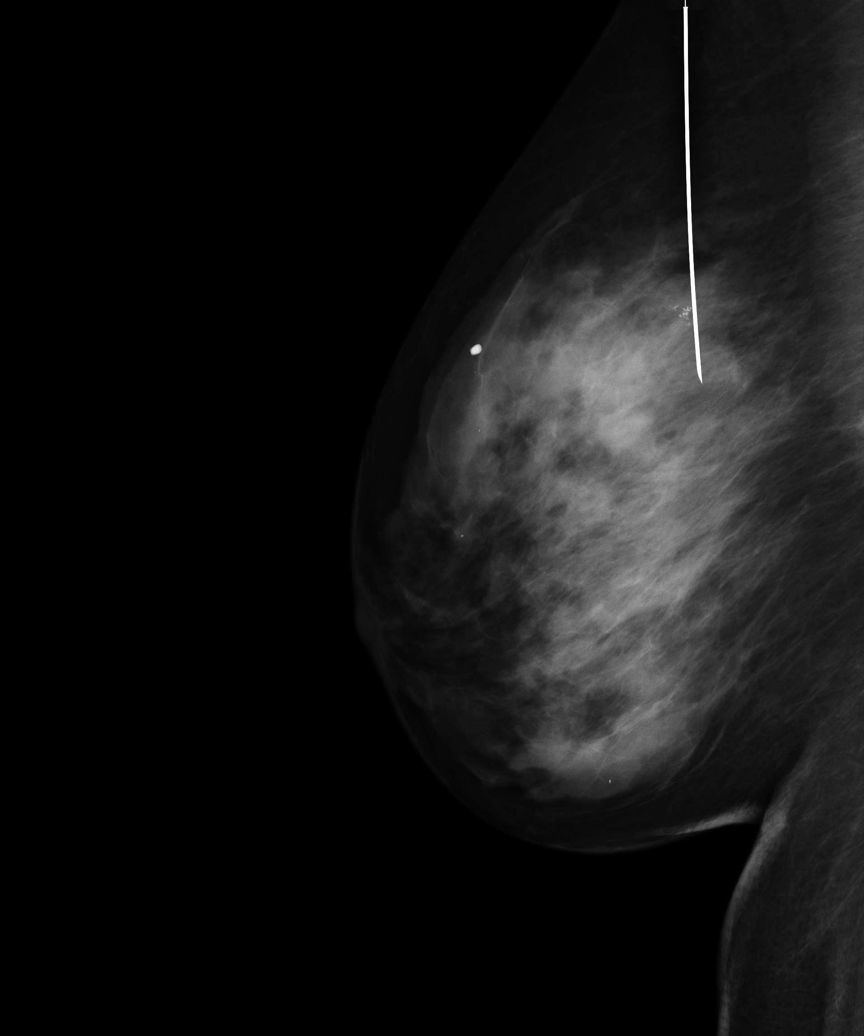
[im 5/6]
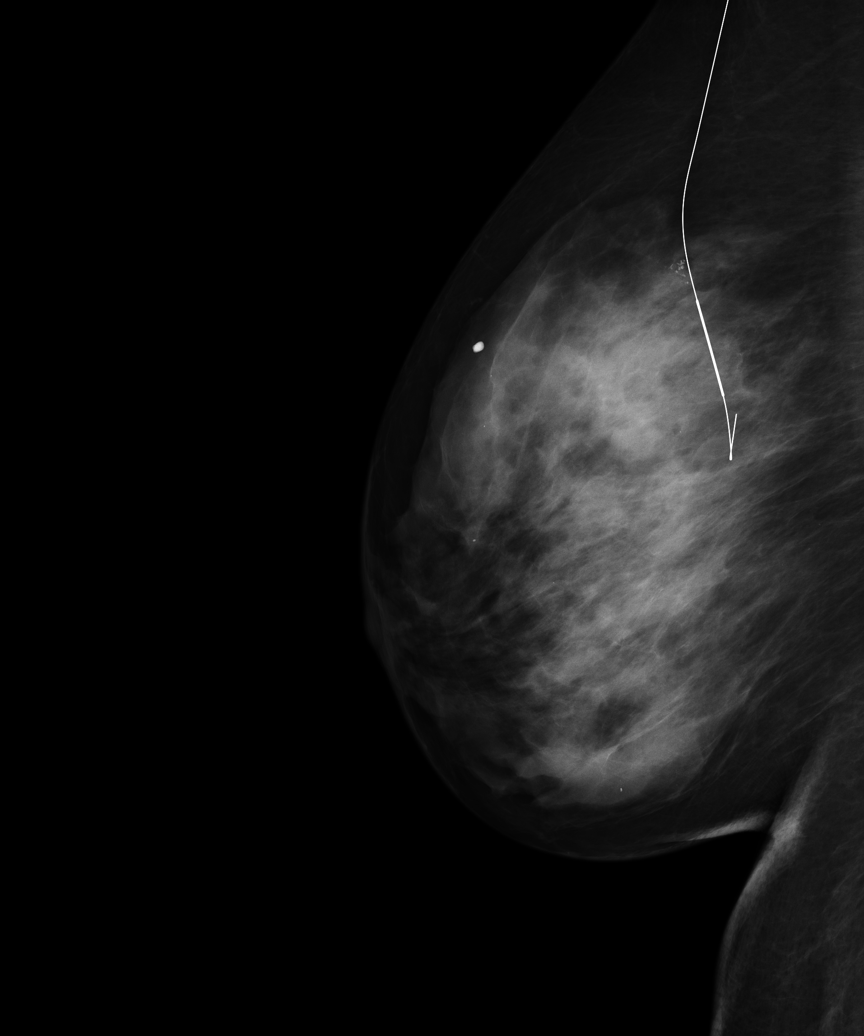
[im 6/6]
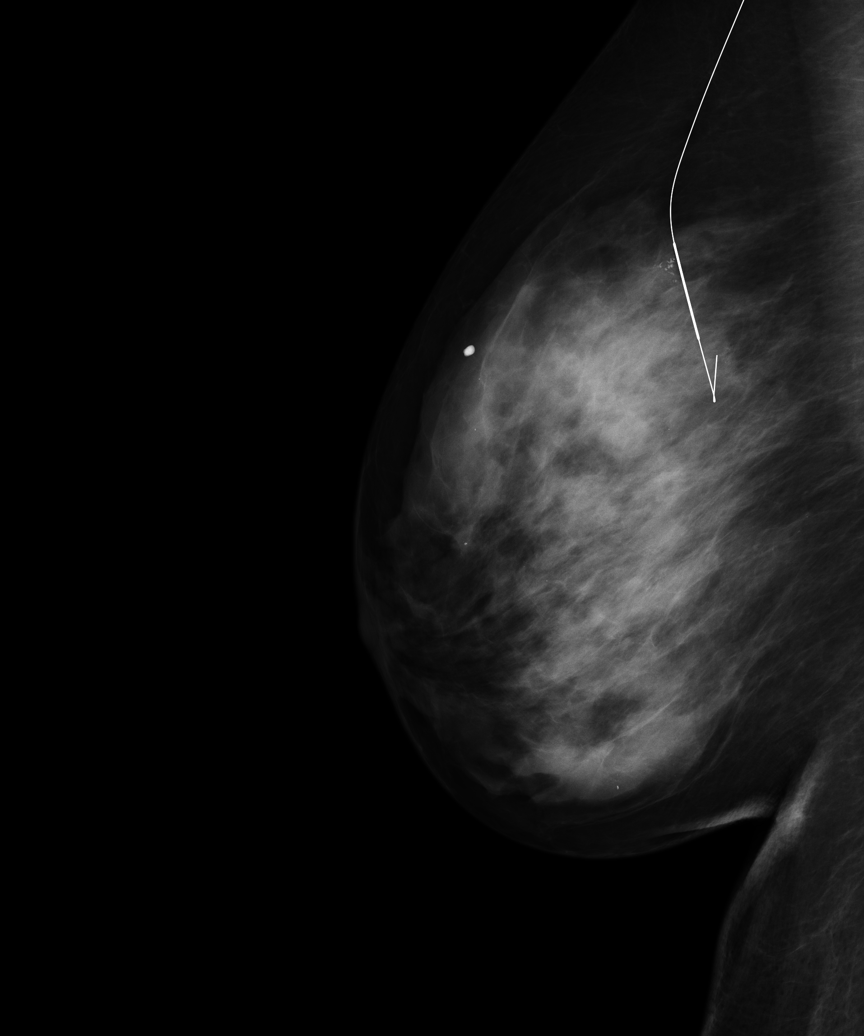

[6 of 6 positions shown; findings below may reference images not displayed]

IMPRESSION: Successful needle localization.

## 2009-11-13 ENCOUNTER — Ambulatory Visit: Payer: Self-pay | Admitting: Internal Medicine

## 2009-11-13 IMAGING — MG MM CAD SCREENING MAMMO
1 series · 5 of 5 positions shown · non-contrast
Comparison: none

REASON FOR EXAM: screening
COMMENTS:

[Series 3817: R CC · right · 5 of 5 slices shown]
[im 1/5]
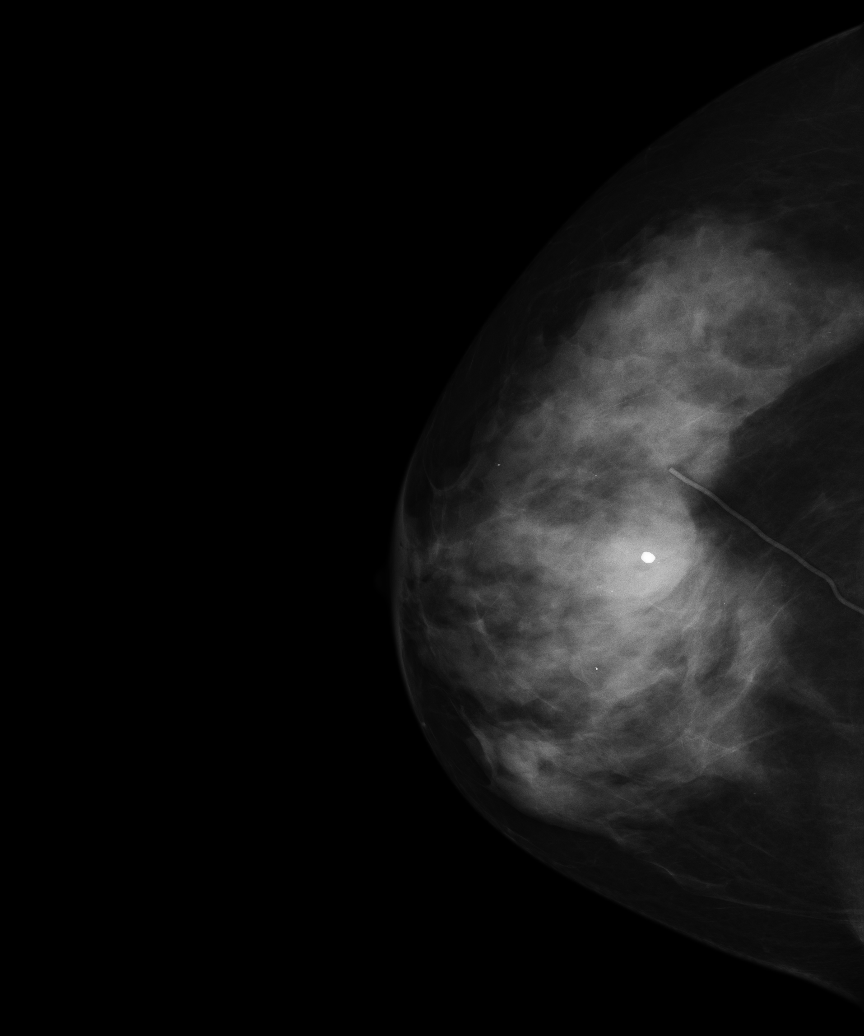
[im 2/5]
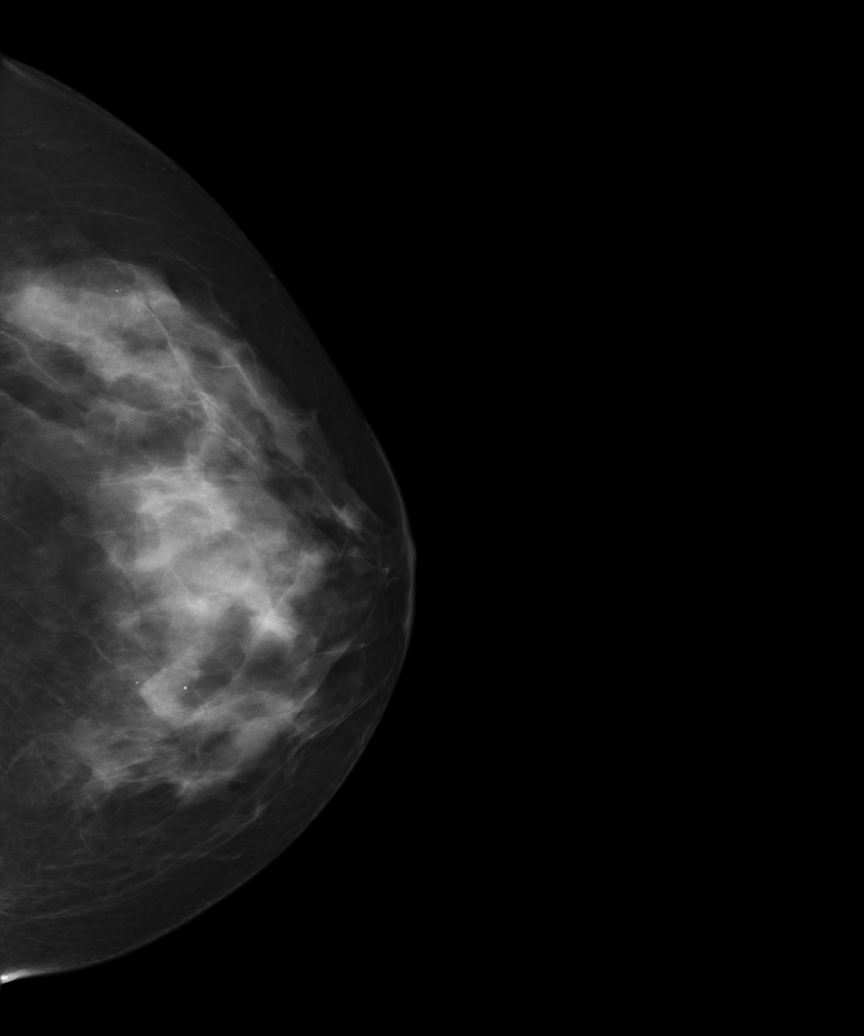
[im 3/5]
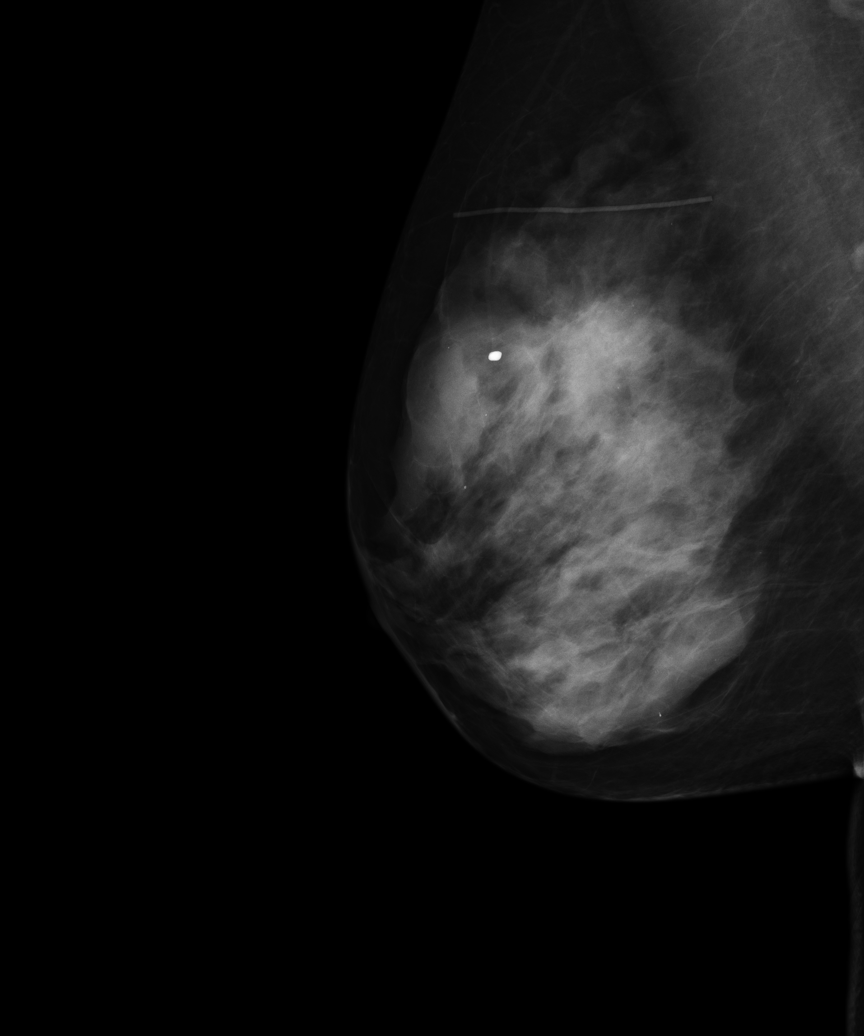
[im 4/5]
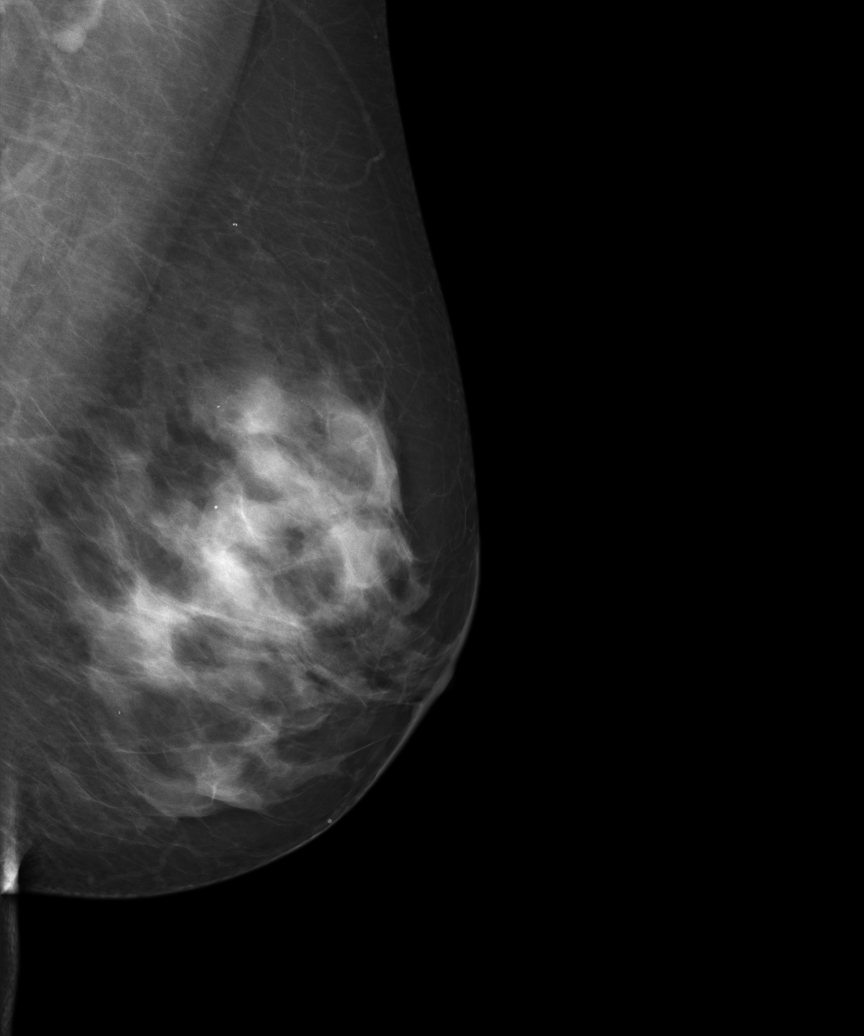
[im 5/5]
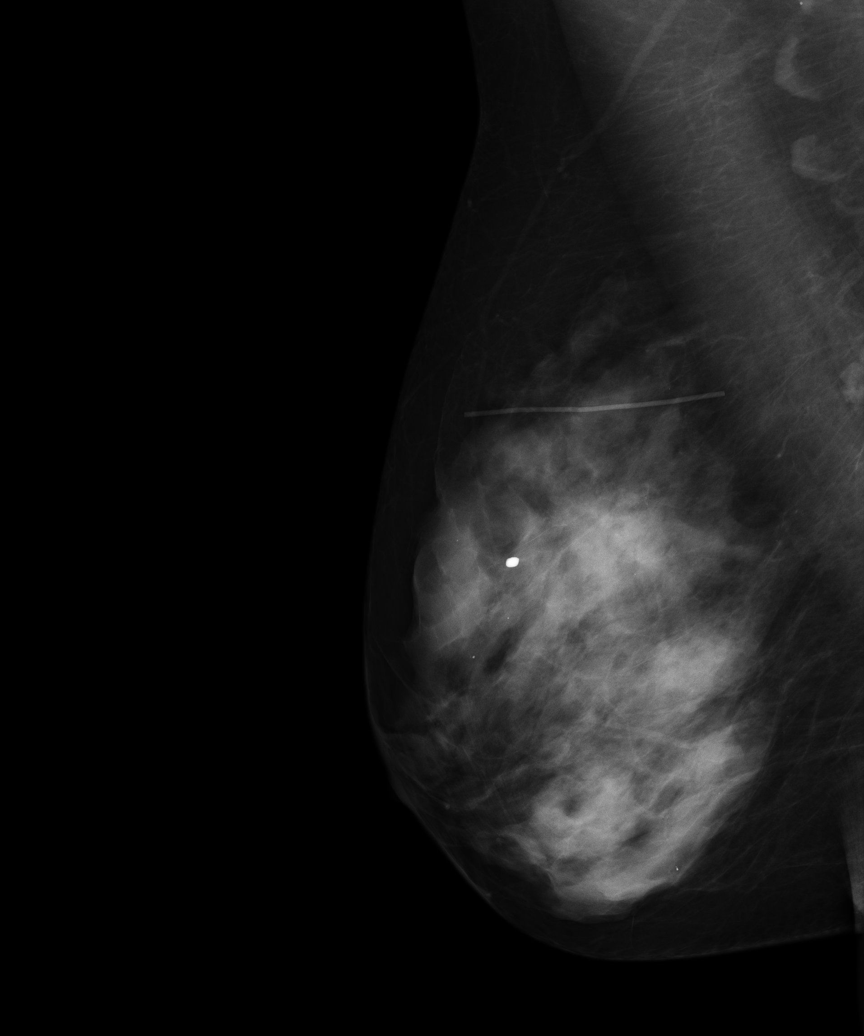

[5 of 5 positions shown; findings below may reference images not displayed]

PROCEDURE:     MAM - MAM DGTL SCREENING MAMMO W/CAD  - [DATE]  [DATE]

RESULT:     Comparison is made to prior examinations of [DATE], [DATE]
and [DATE].  The focus of microcalcification deep in the central portion of
the right breast noted on the prior exam of [DATE] is no longer seen
compatible with interval excisional biopsy.  There is mild scar deformity
observed at the biopsy site. No new masses or new calcification suspicious
for malignancy are seen. The breast parenchyma bilaterally is dense which
lowers the sensitivity of mammography.
IMPRESSION: 1.Bilaterally benign appearing screening mammography.

2.Continued annual screening mammography is recommended.

BI-RADS: Category 2 - Benign Findings

A NEGATIVE MAMMOGRAM REPORT DOES NOT PRECLUDE BIOPSY OR OTHER EVALUATION OF
A CLINICALLY PALPABLE OR OTHERWISE SUSPICIOUS MASS OR LESION. BREAST CANCER
MAY NOT BE DETECTED BY MAMMOGRAPHY IN UP TO 10% OF CASES.

## 2010-07-07 ENCOUNTER — Ambulatory Visit: Payer: Self-pay | Admitting: Orthopedic Surgery

## 2010-07-10 LAB — PATHOLOGY REPORT

## 2011-07-09 ENCOUNTER — Ambulatory Visit: Payer: Self-pay | Admitting: Internal Medicine

## 2011-07-09 IMAGING — MG MM CAD SCREENING MAMMO
1 series · 4 of 4 positions shown · non-contrast
Comparison: none

REASON FOR EXAM: scr
COMMENTS:

[Series 5584: R CC · right · 4 of 4 slices shown]
[im 1/4]
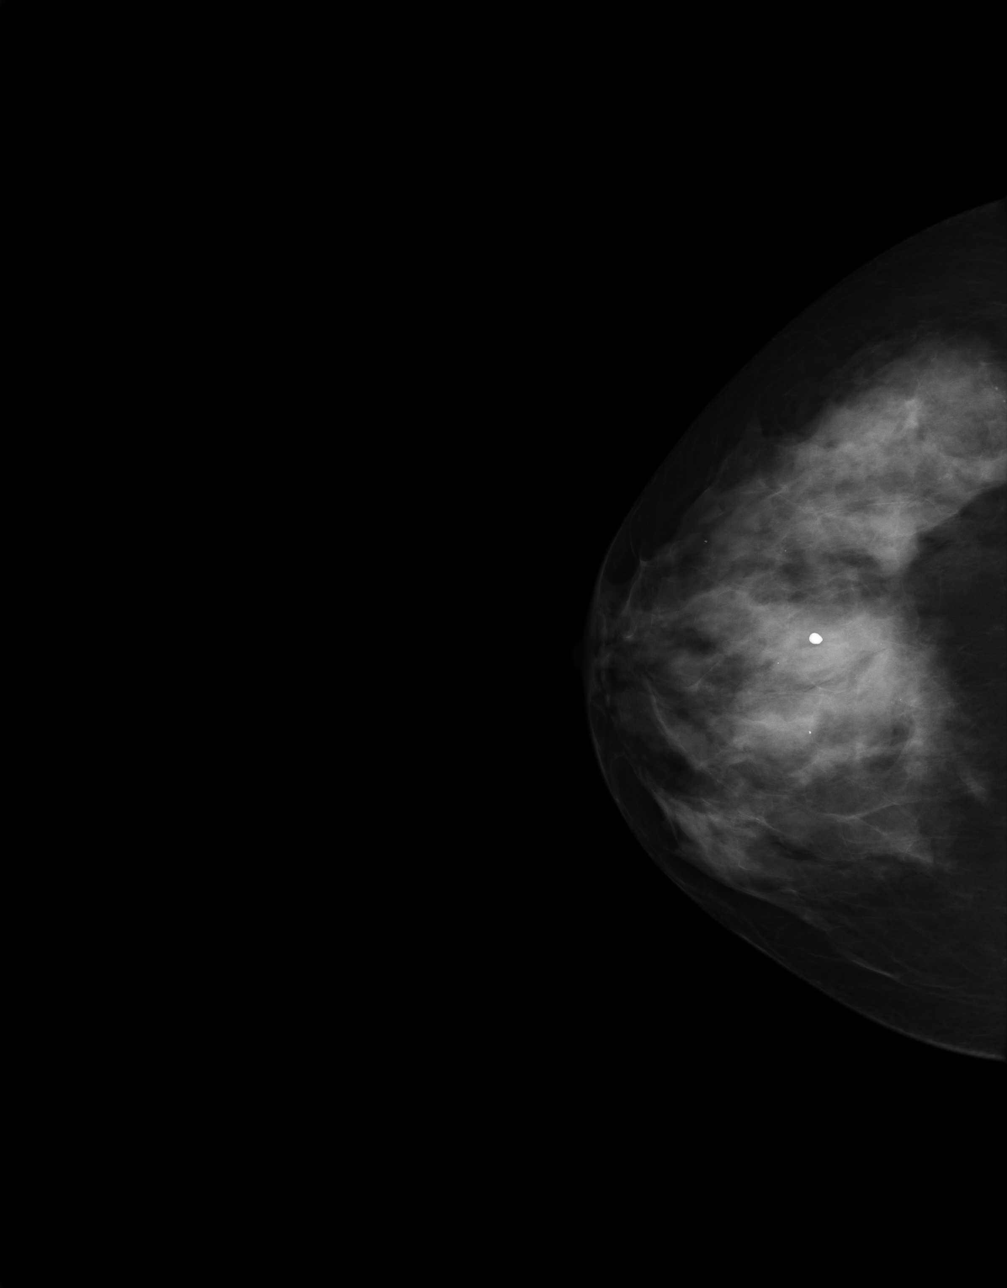
[im 2/4]
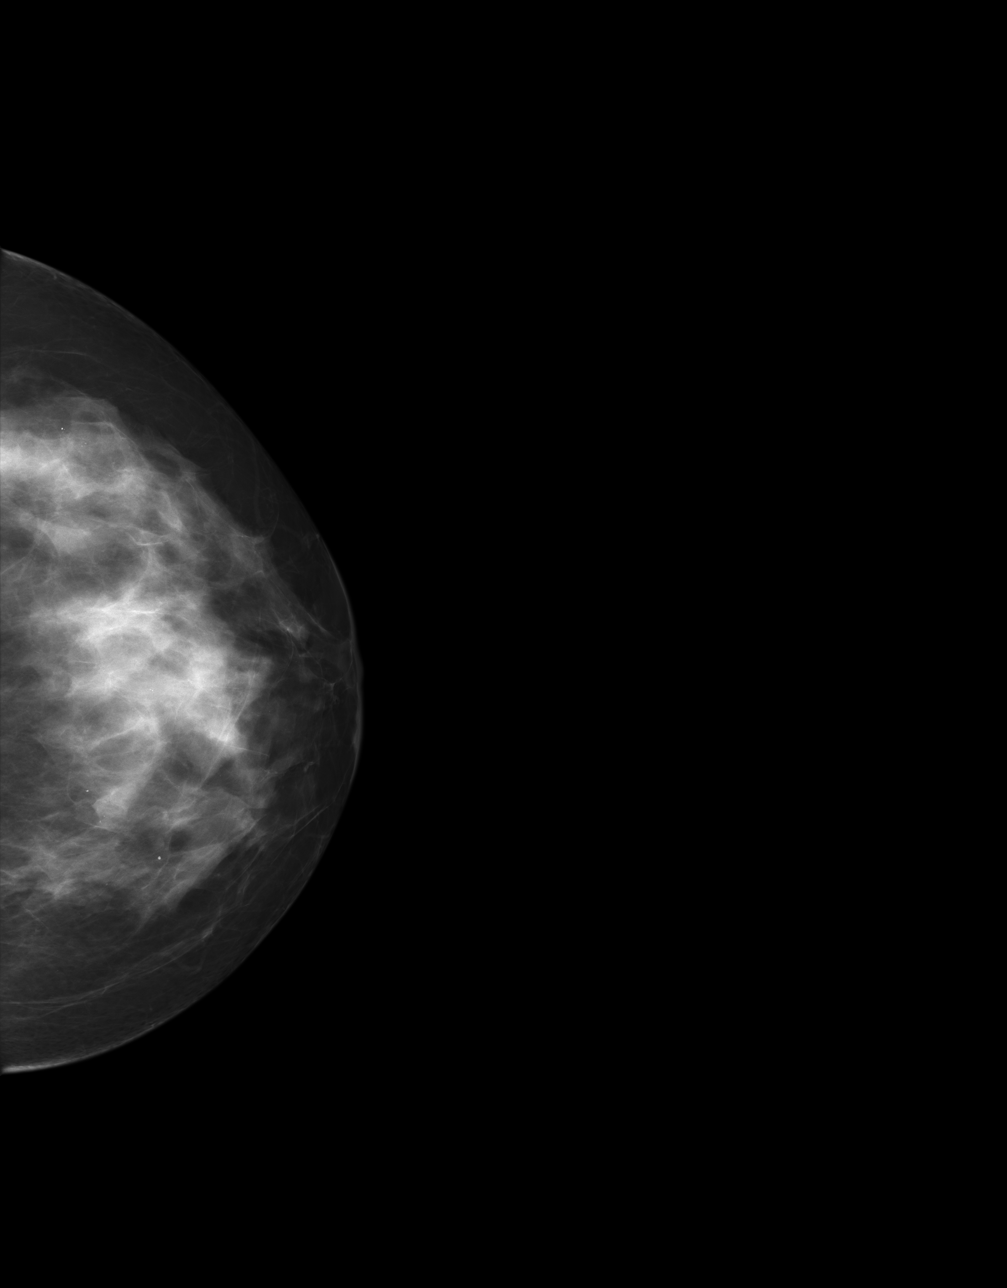
[im 3/4]
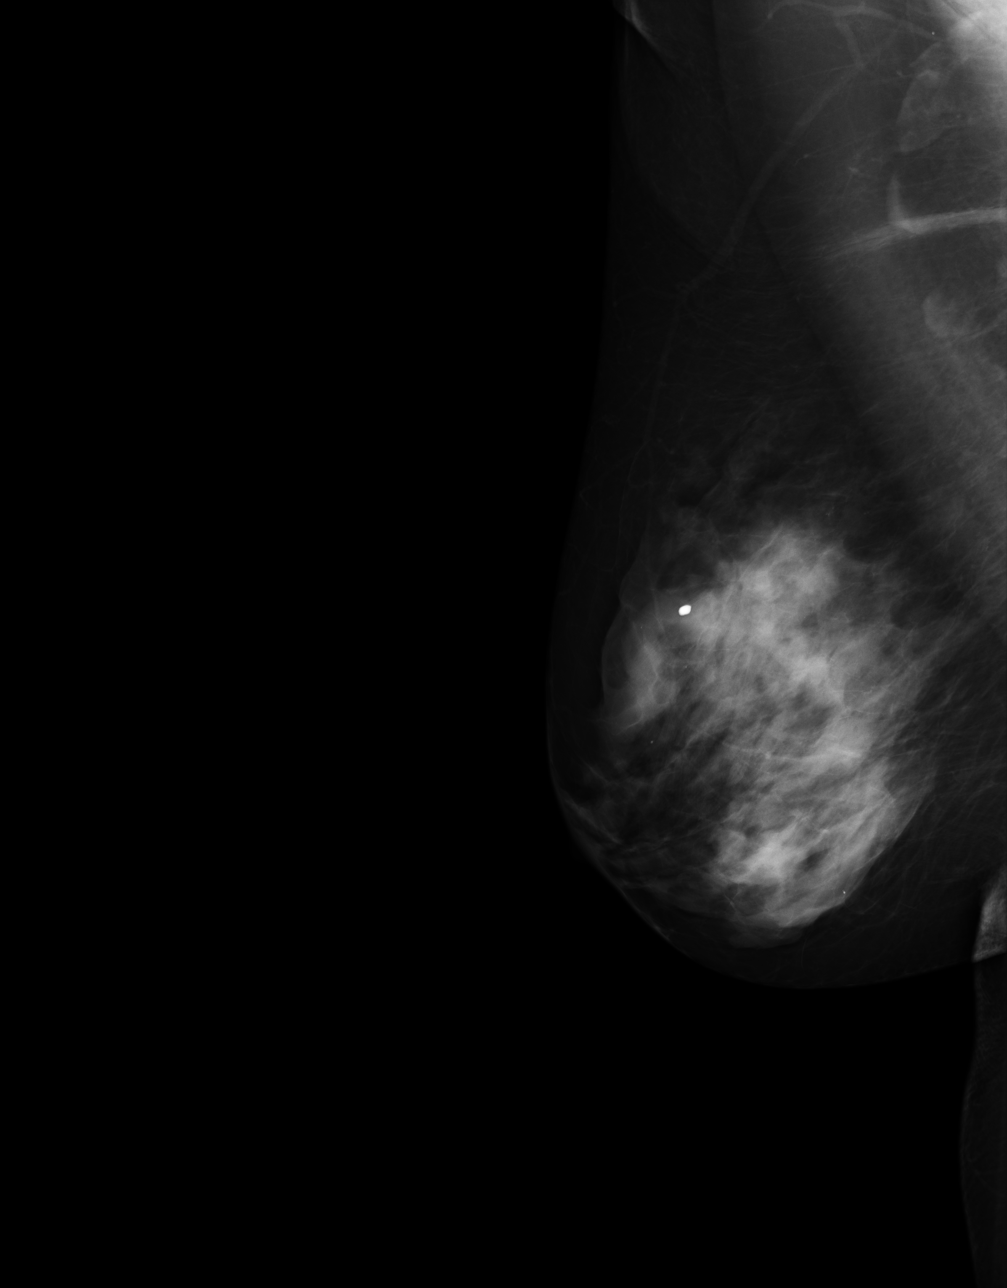
[im 4/4]
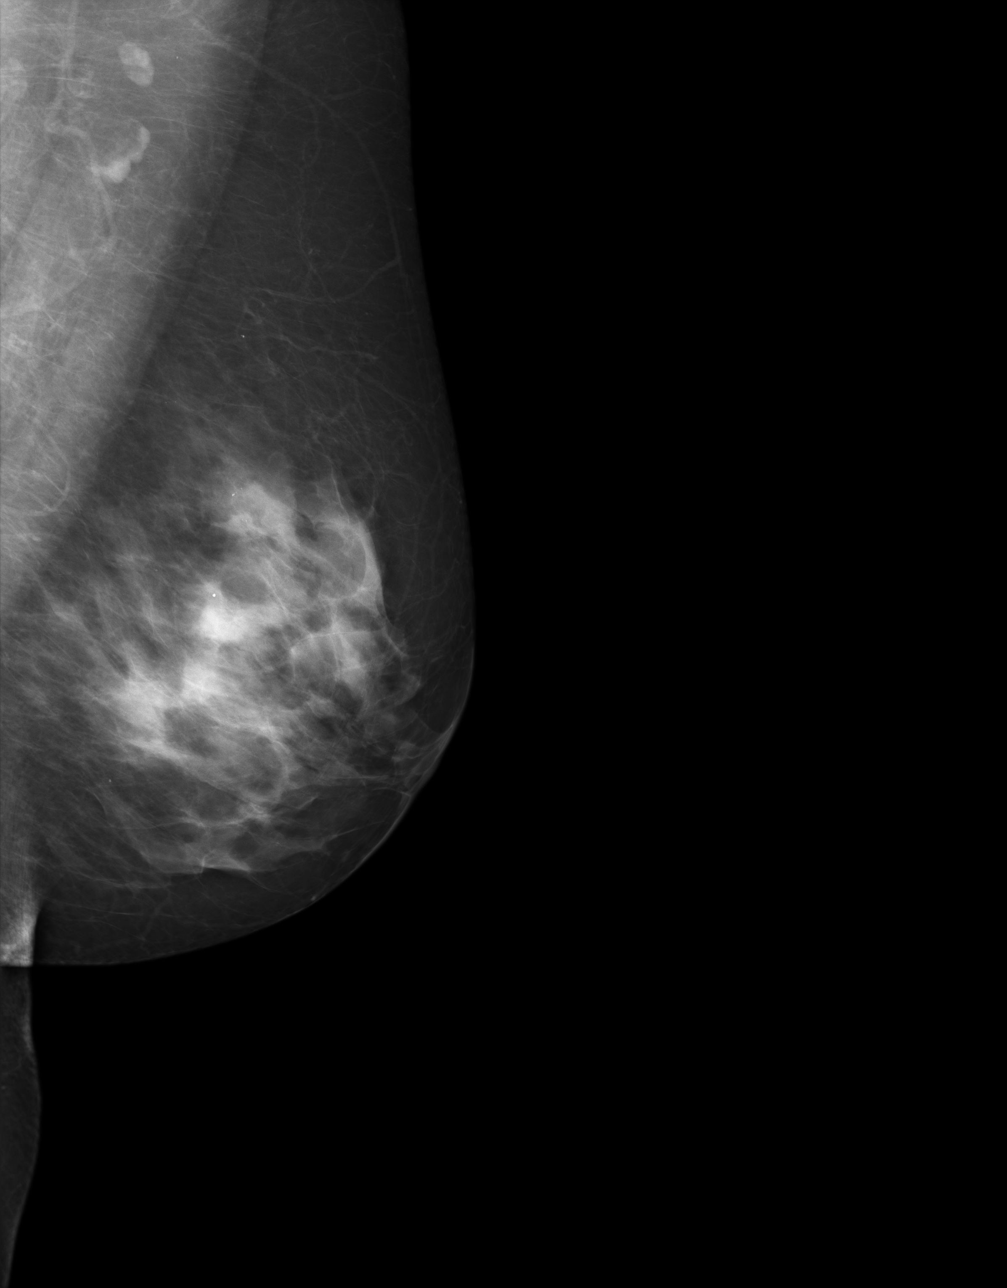

[4 of 4 positions shown; findings below may reference images not displayed]

PROCEDURE:     MAM - MAM DGTL SCREENING MAMMO W/CAD  - [DATE] [DATE]

RESULT:     Comparison is made to prior studies dated [DATE], [DATE]
and [DATE].

The breasts demonstrate a heterogeneous, moderately dense, fibroglandular
parenchymal pattern. An area of asymmetric density projects within the
superior central portion of the left breast approximately 6 cm from the
nipple. Further evaluation with magnification compression imaging is
recommended. A stable coarse, benign appearing calcification is identified
within the right breast. There is no further radiographic evidence to
suggest malignancy.
IMPRESSION: BI-RADS: Category 0 - Needs Addtional Imaging Evaluation

A NEGATIVE MAMMOGRAM REPORT DOES NOT PRECLUDE BIOPSY OR OTHER EVALUATION OF
A CLINICALLY PALPABLE OR OTHERWISE SUSPICIOUS MASS OR LESION. BREAST CANCER
MAY NOT BE DETECTED BY MAMMOGRAPHY IN UP TO 10% OF CASES.

## 2011-07-20 ENCOUNTER — Ambulatory Visit: Payer: Self-pay | Admitting: Internal Medicine

## 2011-07-20 IMAGING — MG MM ADDITIONAL VIEWS AT NO CHARGE
1 series · 3 of 3 positions shown · non-contrast
Comparison: none

REASON FOR EXAM: AV ASYMMETRIC DENSITY
COMMENTS:

PROCEDURE:     MAM - MAM DGTL ADD VW LT  SCR  - [DATE]  [DATE]
RESULT:     Previously described area of asymmetric density within the left
breast effaces with compression.

[Series 4286: L ML · left · 3 of 3 slices shown]
[im 1/3]
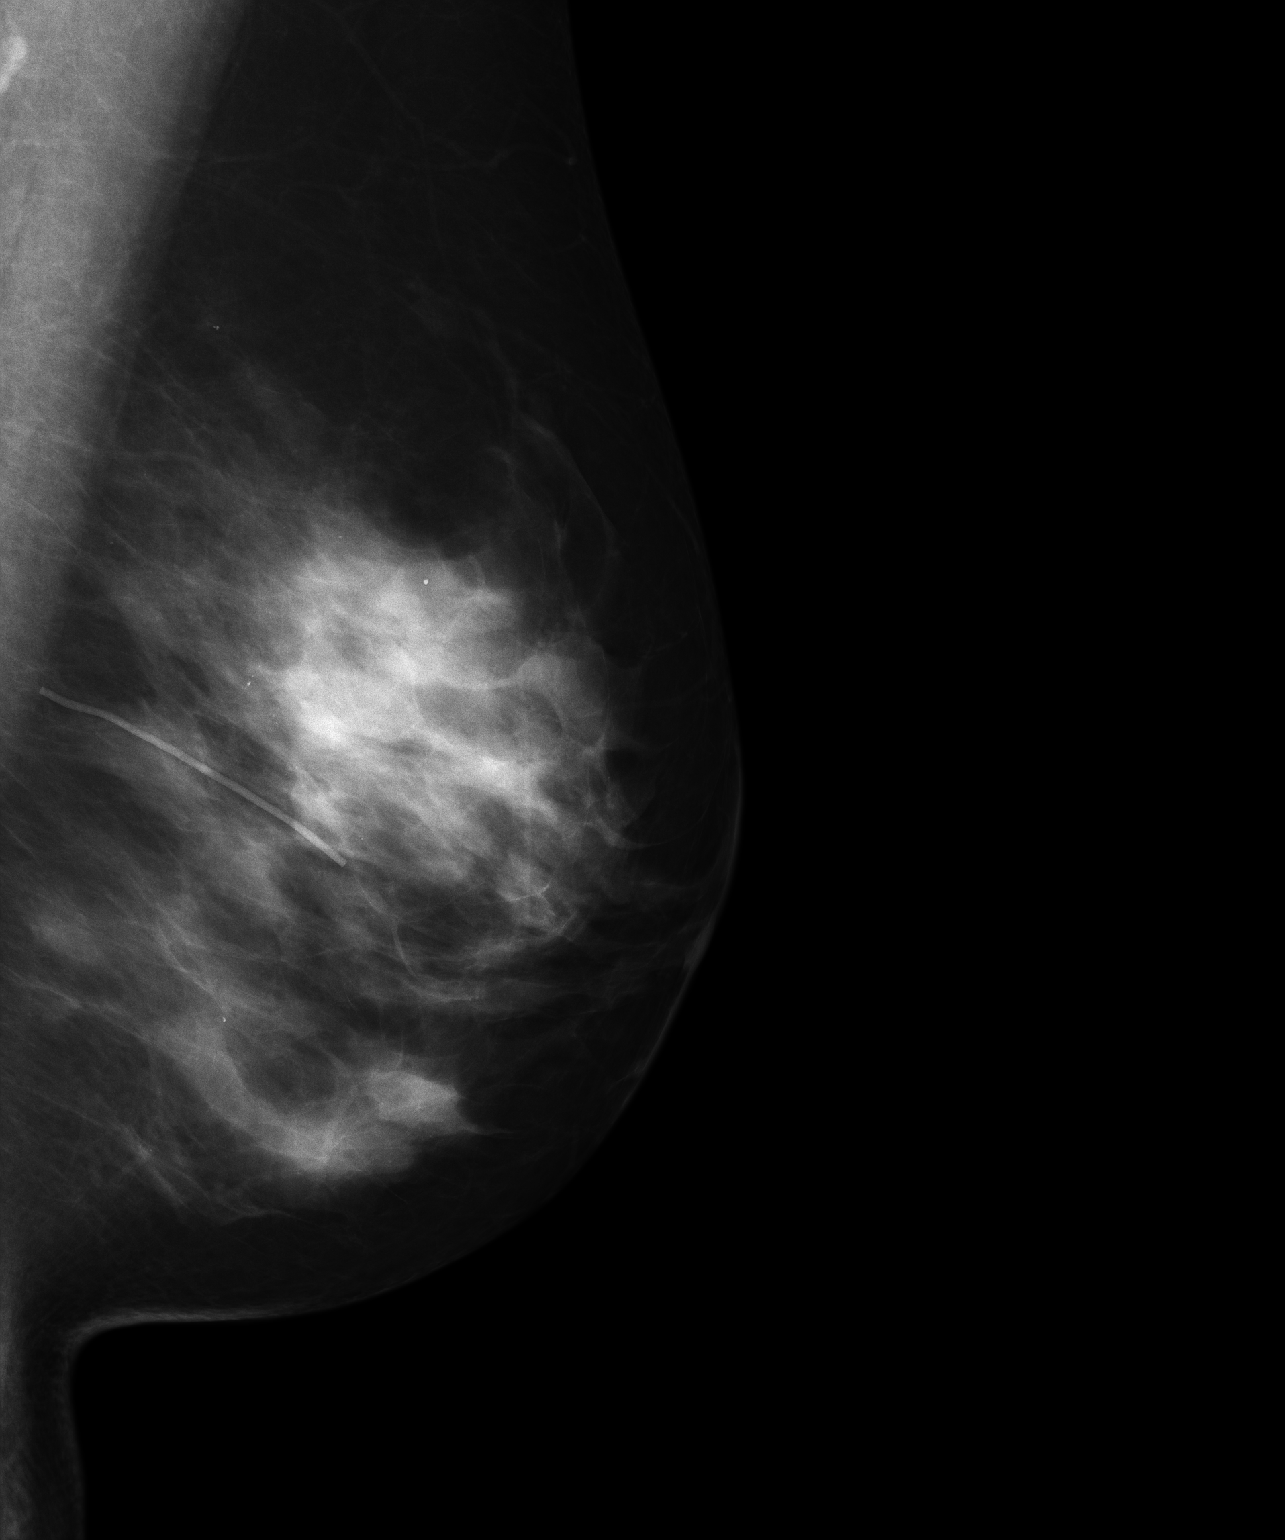
[im 2/3]
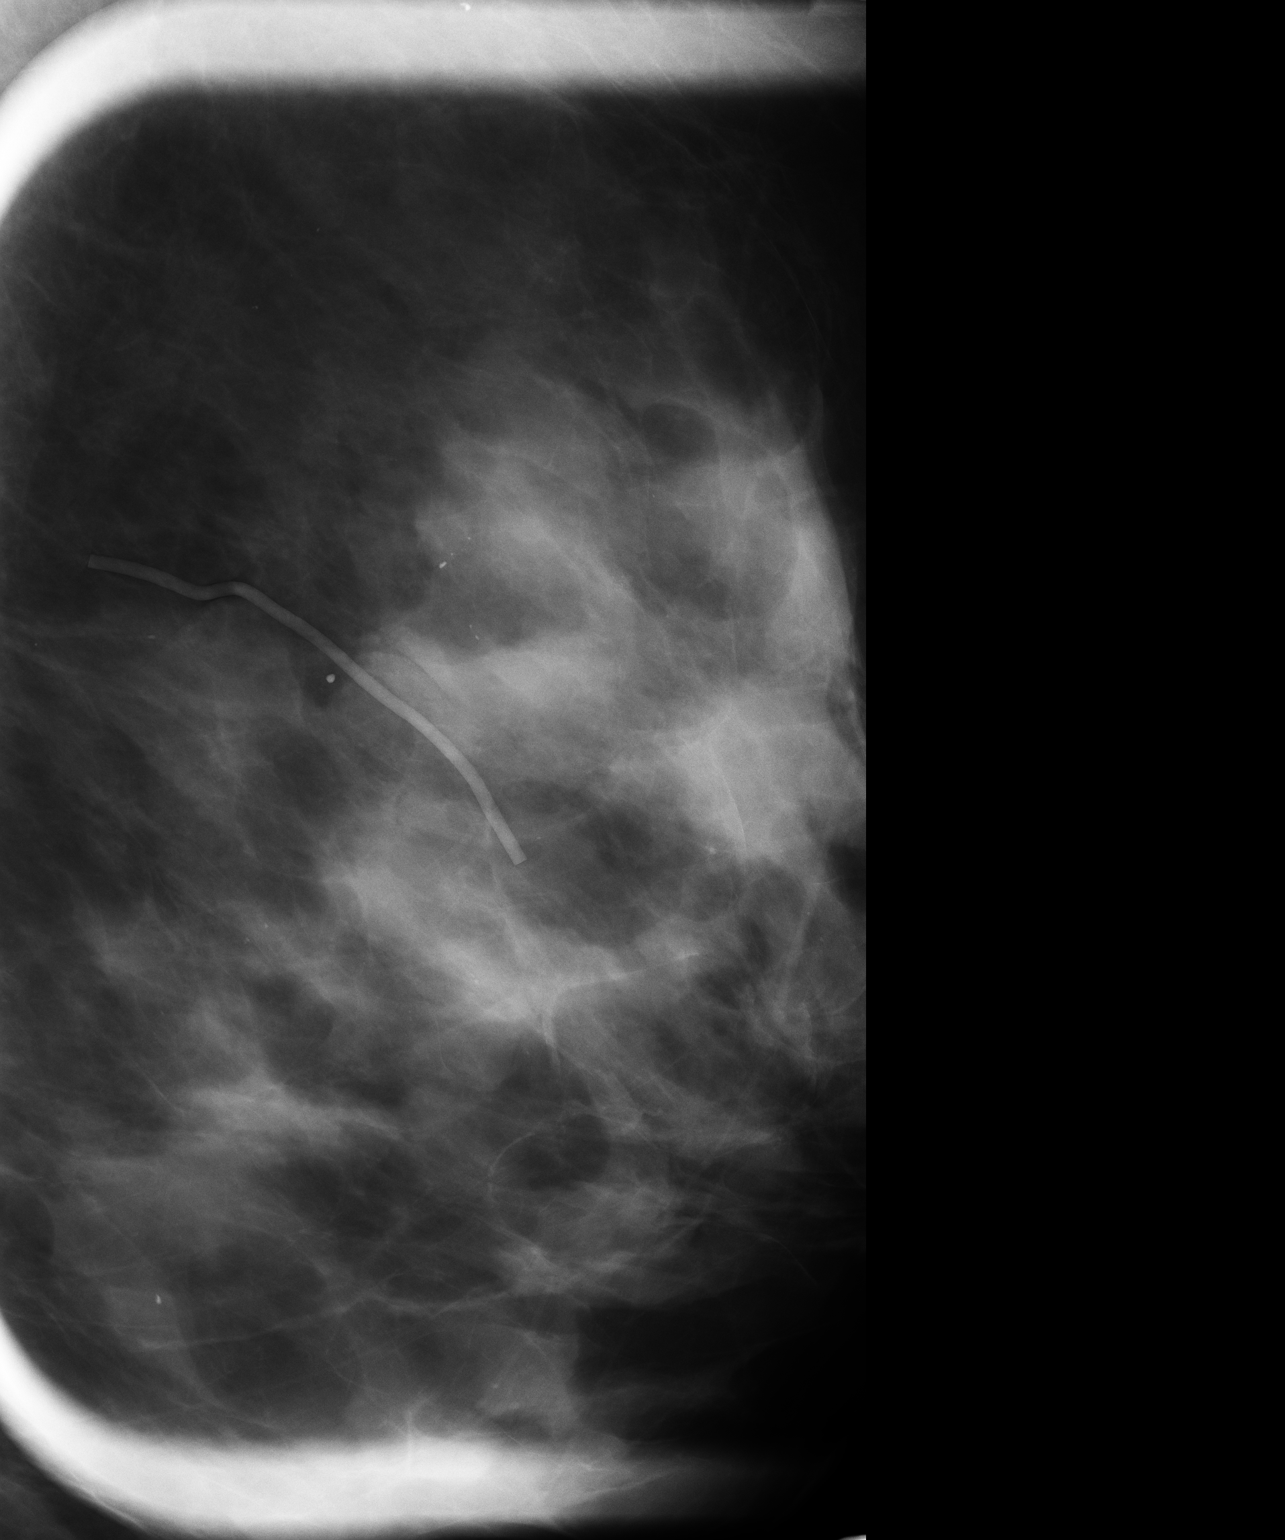
[im 3/3]
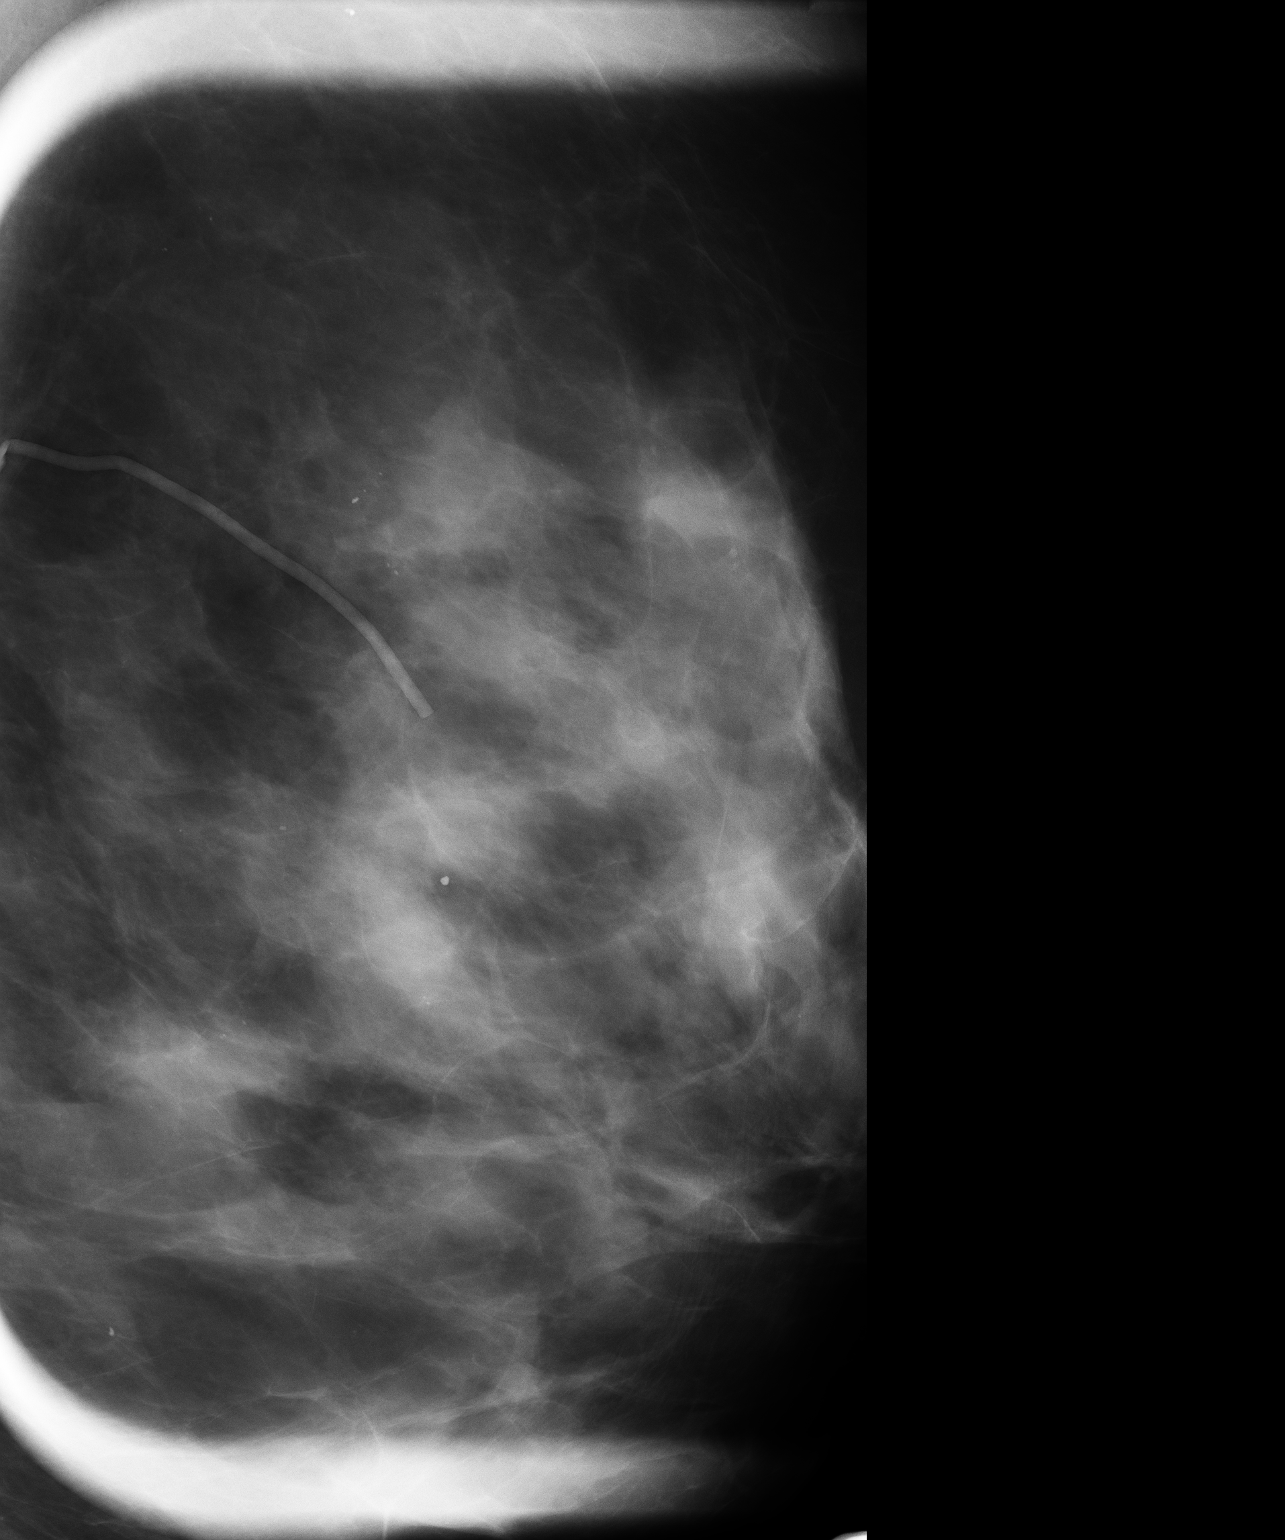

[3 of 3 positions shown; findings below may reference images not displayed]

IMPRESSION: BI-RADS:  Category 2- Benign Finding.

A negative mammogram report does not preclude biopsy or other evaluation of
a clinically palpable or otherwise suspicious mass or lesion. Breast cancer
may not be detected by mammography in up to 10% of cases.

## 2012-07-18 ENCOUNTER — Ambulatory Visit: Payer: Self-pay | Admitting: Internal Medicine

## 2012-07-18 IMAGING — MG MM CAD SCREENING MAMMO
1 series · 4 of 4 positions shown · non-contrast
Comparison: [DATE], [DATE].

REASON FOR EXAM: SCR MAMMO NO ORDER
COMMENTS:

PROCEDURE:     MAM - MAM DGTL SCRN MAM NO ORDER W/CAD  - [DATE]  [DATE]
RESULT:

[R CC · right · 4 of 4 slices shown]
[im 1/4]
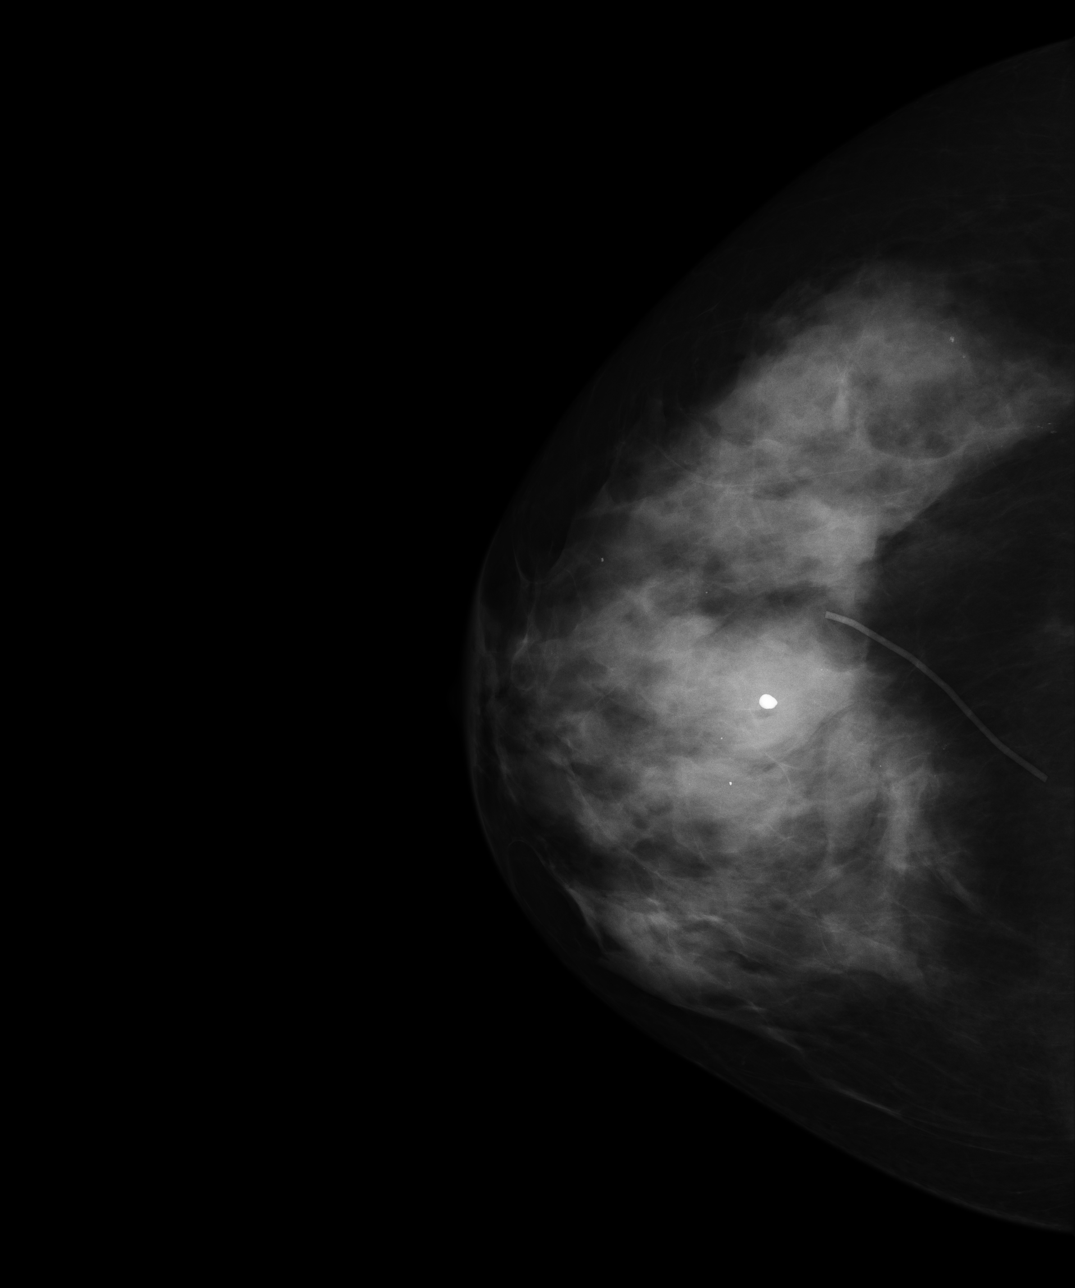
[im 2/4]
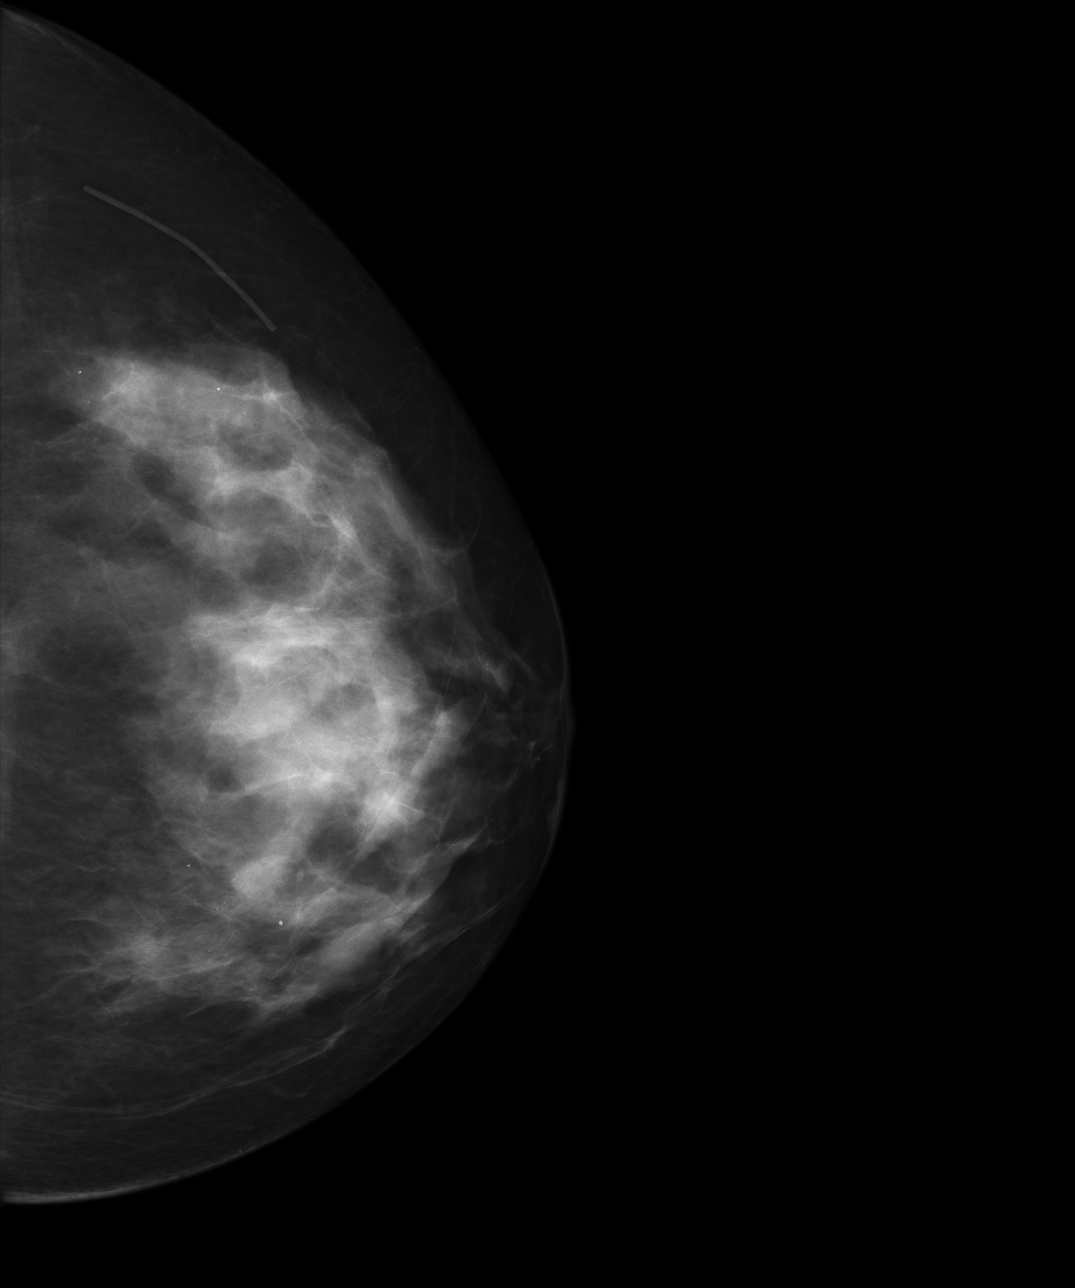
[im 3/4]
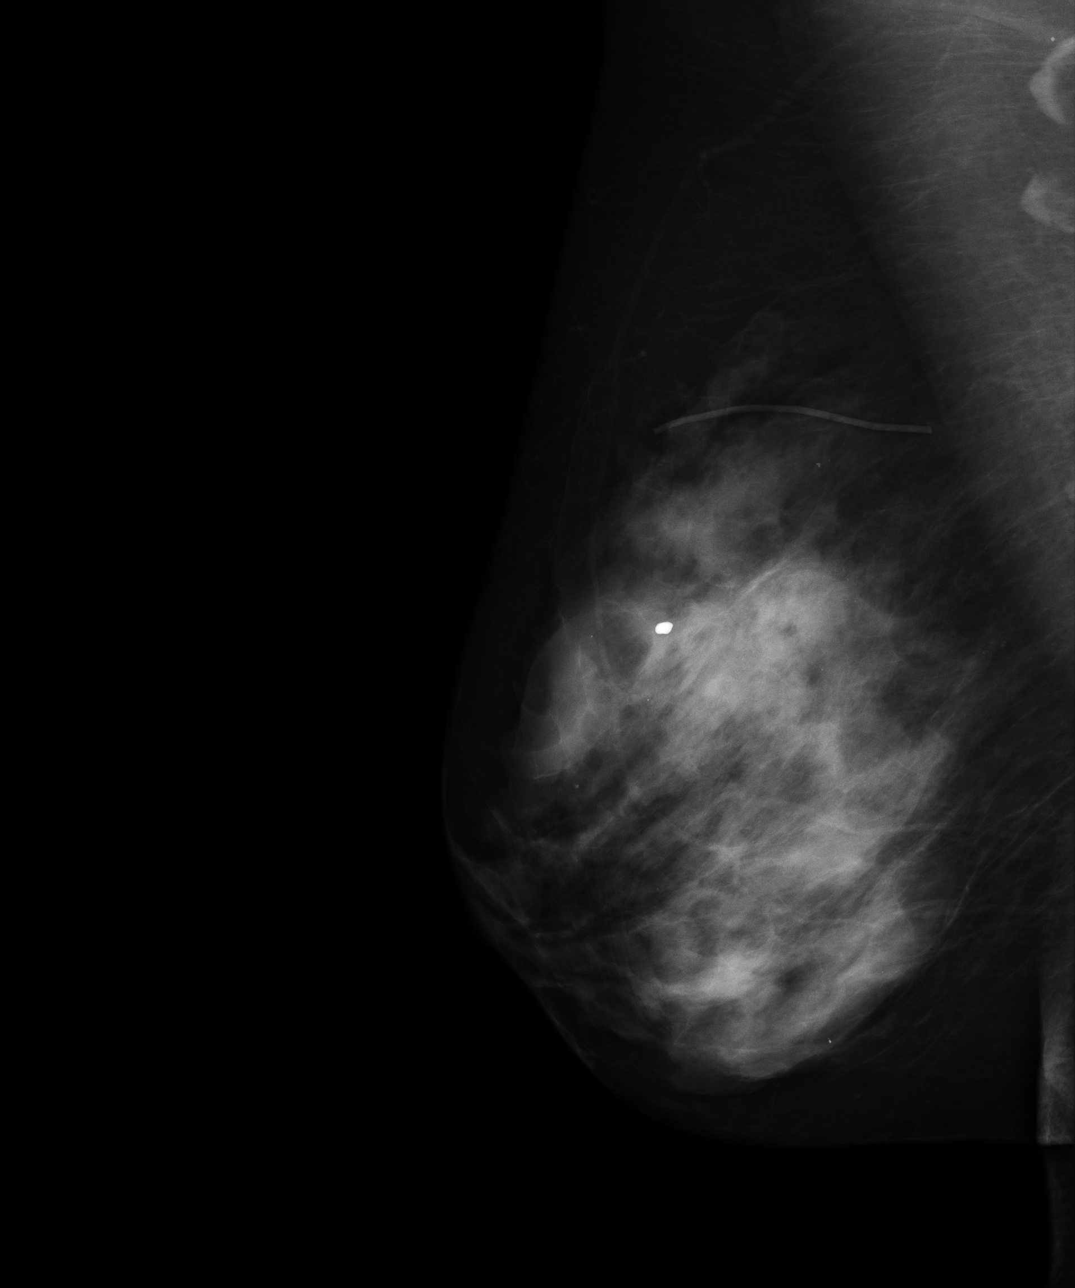
[im 4/4]
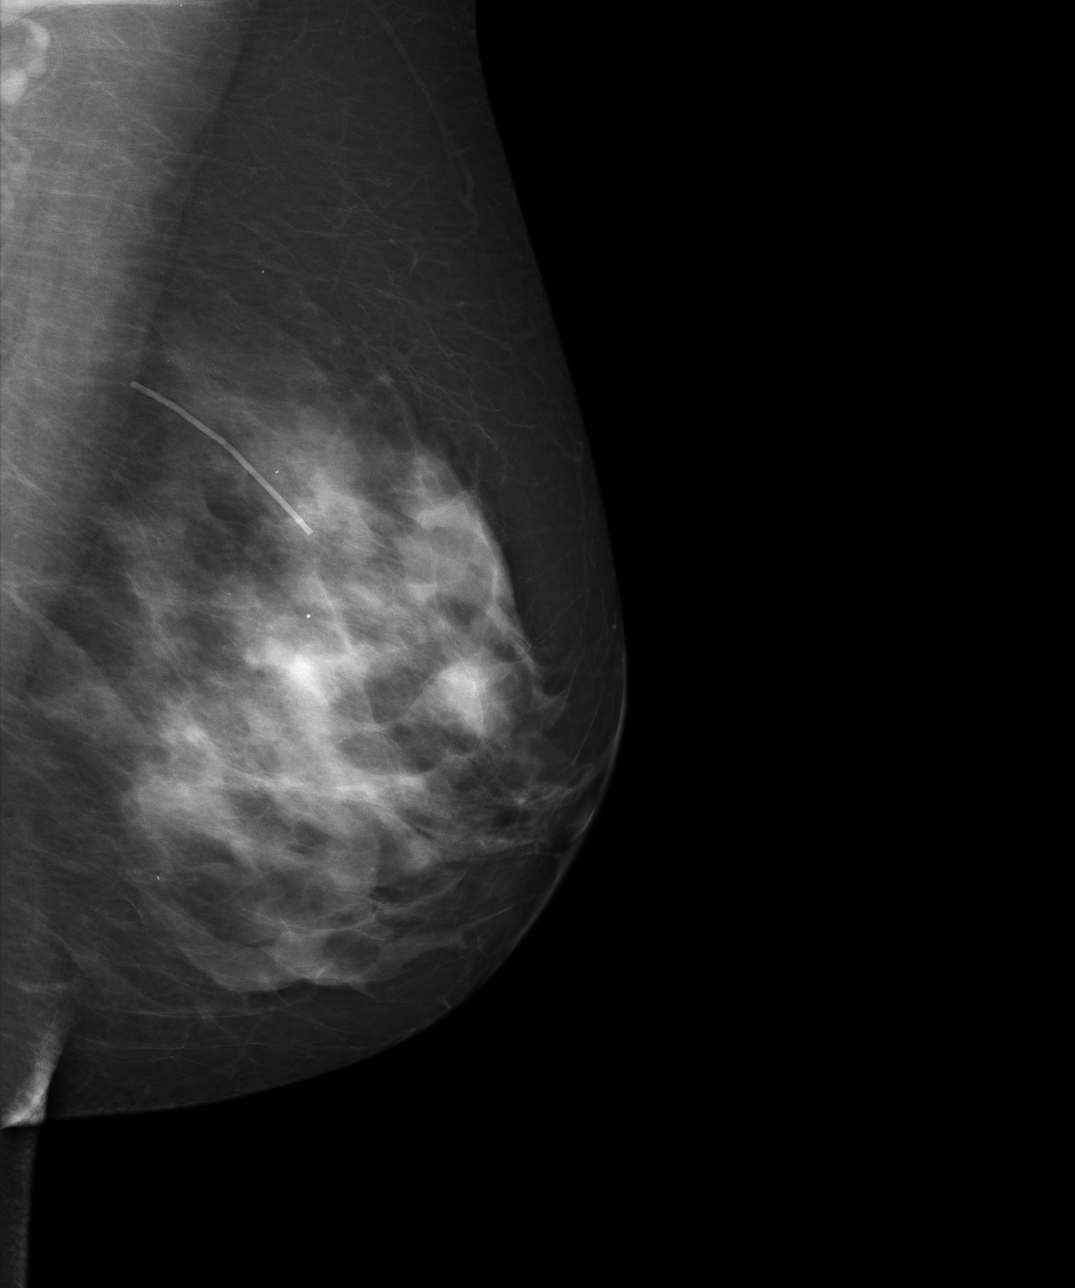

[4 of 4 positions shown; findings below may reference images not displayed]

FINDINGS: The breast tissue is heterogeneously dense, which may lower the sensitivity
of mammography. No suspicious masses or calcifications are identified. No
areas of architectural distortion.
IMPRESSION: 1.     BI-RADS: Category 1 Negative.
2.     Recommend continued annual screening mammography.

[REDACTED]

Thank you for this opportunity to contribute to the care of your patient.

A NEGATIVE MAMMOGRAM REPORT DOES NOT PRECLUDE BIOPSY OR OTHER EVALUATION OF
A CLINICALLY PALPABLE OR OTHERWISE SUSPICIOUS MASS OR LESION. BREAST CANCER
MAY NOT BE DETECTED BY MAMMOGRAPHY IN UP TO 10% OF CASES.

## 2012-12-15 ENCOUNTER — Emergency Department: Payer: Self-pay | Admitting: Emergency Medicine

## 2012-12-15 LAB — CBC
MCHC: 34.6 g/dL (ref 32.0–36.0)
MCV: 92 fL (ref 80–100)
Platelet: 204 10*3/uL (ref 150–440)
RDW: 13.2 % (ref 11.5–14.5)

## 2012-12-15 LAB — URINALYSIS, COMPLETE
Bilirubin,UR: NEGATIVE
Ph: 7 (ref 4.5–8.0)
Squamous Epithelial: 1

## 2012-12-15 LAB — COMPREHENSIVE METABOLIC PANEL
Alkaline Phosphatase: 106 U/L (ref 50–136)
Anion Gap: 11 (ref 7–16)
Calcium, Total: 9.4 mg/dL (ref 8.5–10.1)
Chloride: 108 mmol/L — ABNORMAL HIGH (ref 98–107)
EGFR (African American): >60
Glucose: 115 mg/dL — ABNORMAL HIGH (ref 65–99)
Potassium: 3.8 mmol/L (ref 3.5–5.1)
Sodium: 139 mmol/L (ref 136–145)
Total Protein: 7.7 g/dL (ref 6.4–8.2)

## 2012-12-15 IMAGING — US ABDOMEN ULTRASOUND LIMITED
1 series · 14 of 25 positions shown · non-contrast
Comparison: 

REASON FOR EXAM: rt upper quad pain
COMMENTS:   Body Site: GB and Fossa, CBD, Head of Pancreas

PROCEDURE:     US  - US ABDOMEN LIMITED SURVEY  - [DATE] [DATE]
RESULT:     Right or quadrant abdominal ultrasound dated [DATE]
TECHNIQUE: Real time sonographic imaging of the right quadrant was obtained.
Static representative images were provided for interpretation.

[Series 1: abdomen ultrasound limited · 0.20mm/px · 14 of 42 slices shown]
[im 1/42]
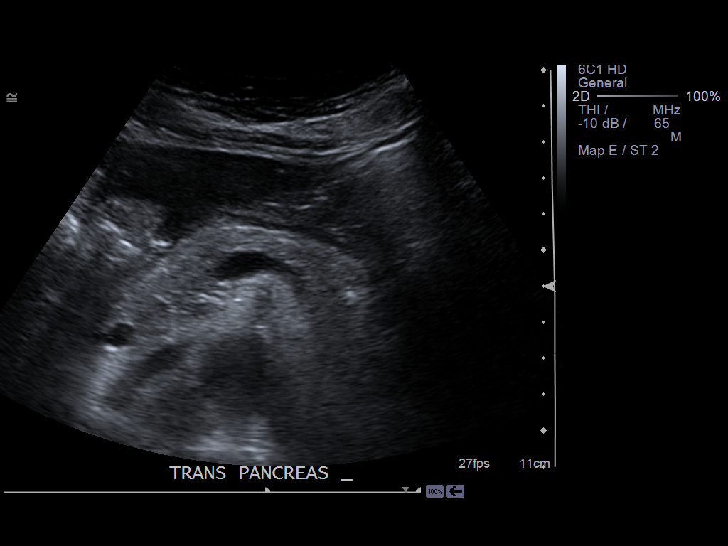
[im 4/42]
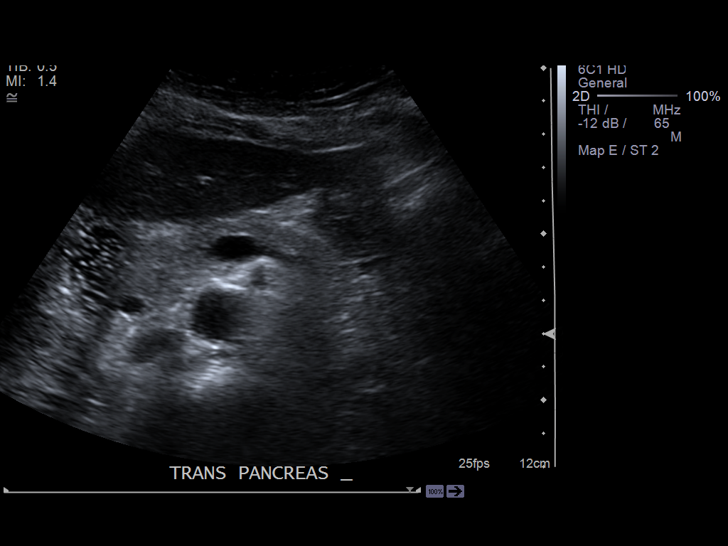
[im 7/42]
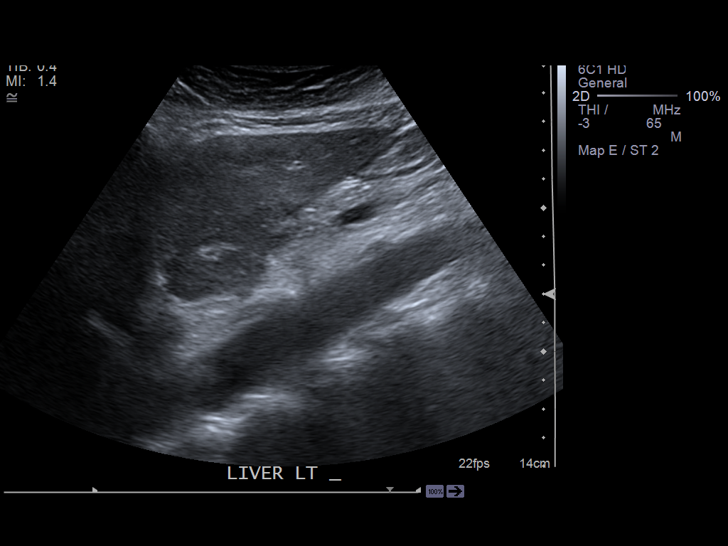
[im 11/42]
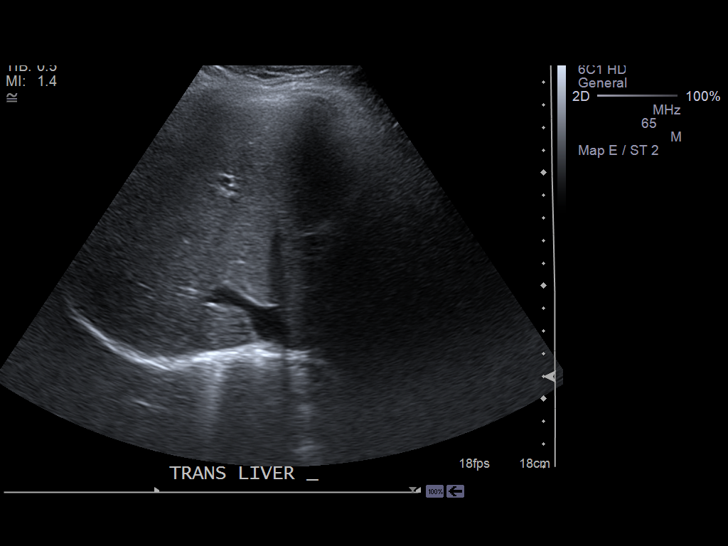
[im 14/42]
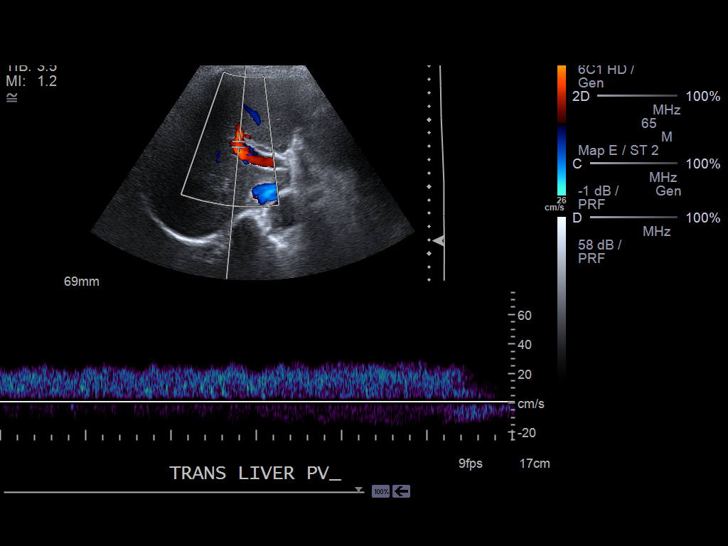
[im 16/42]
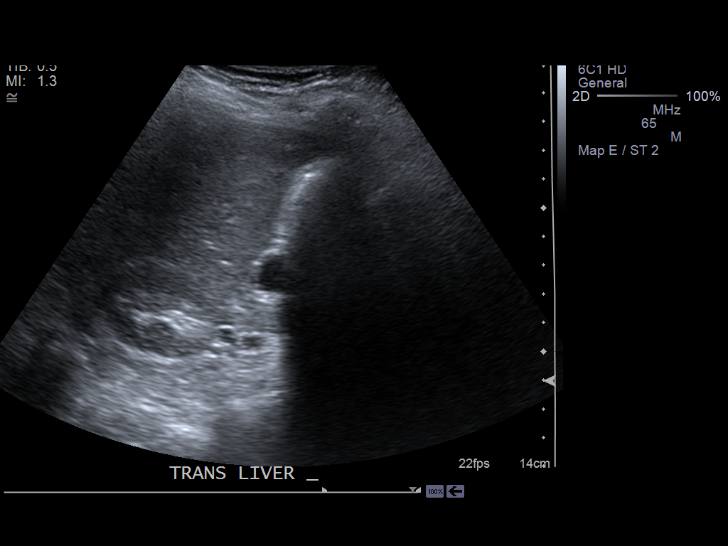
[im 19/42]
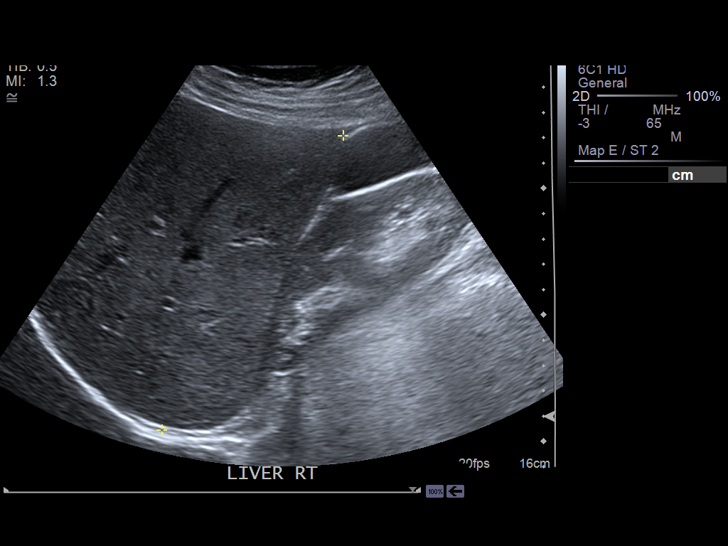
[im 23/42]
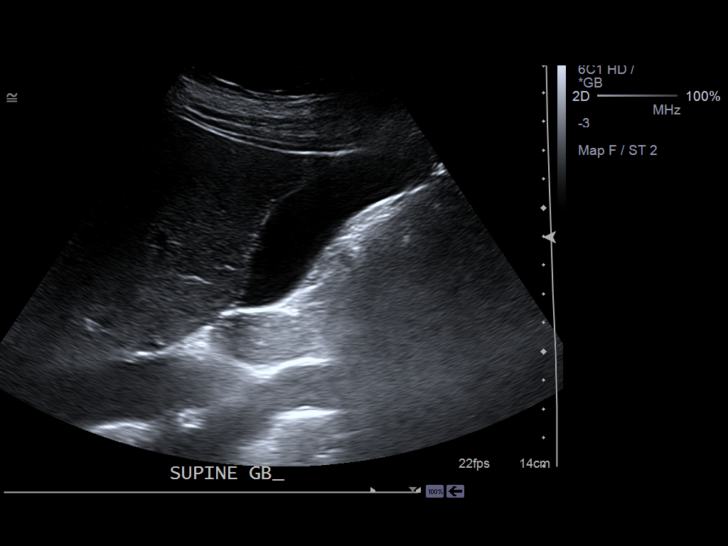
[im 26/42]
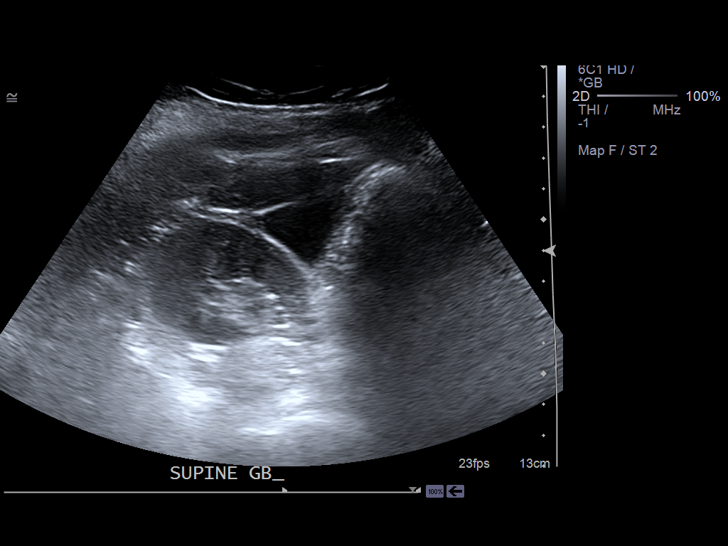
[im 28/42]
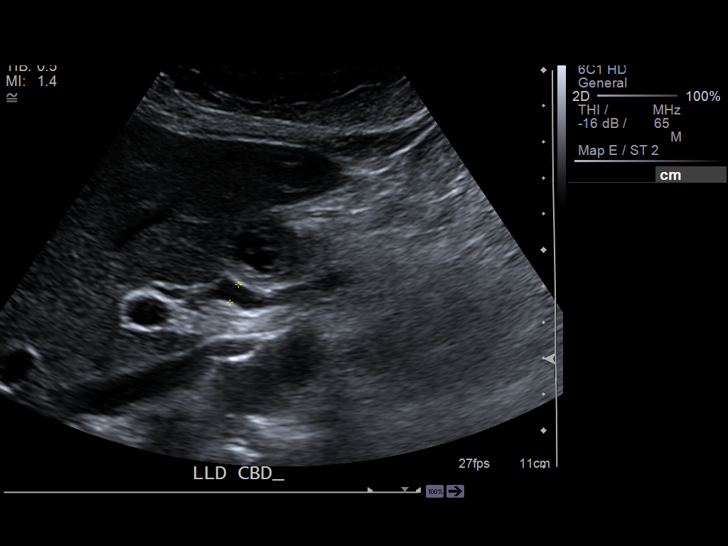
[im 31/42]
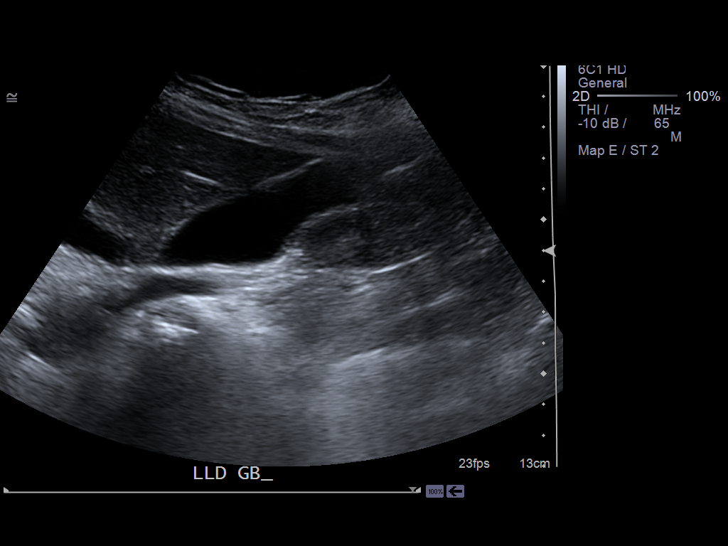
[im 35/42]
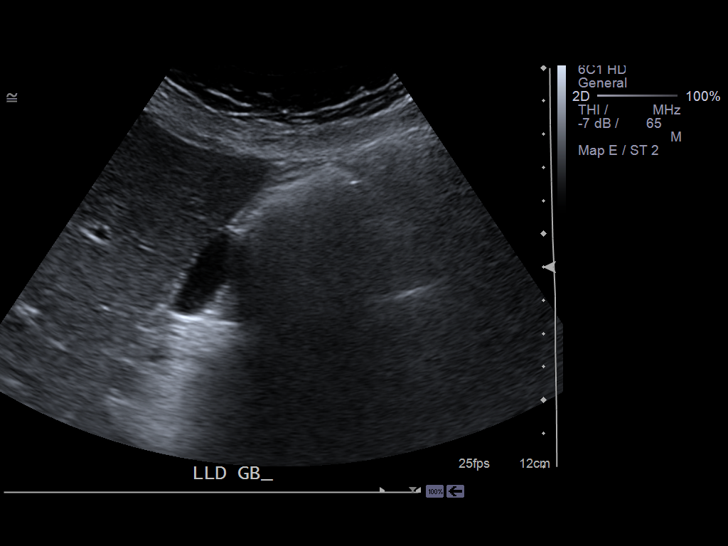
[im 38/42]
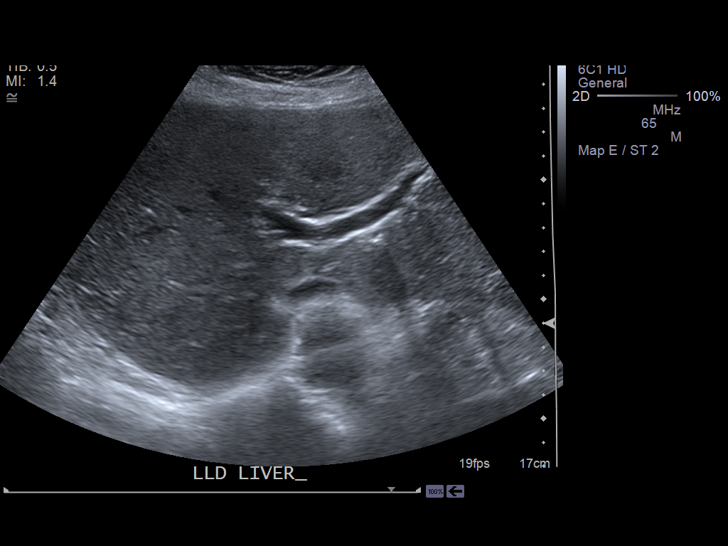
[im 42/42]
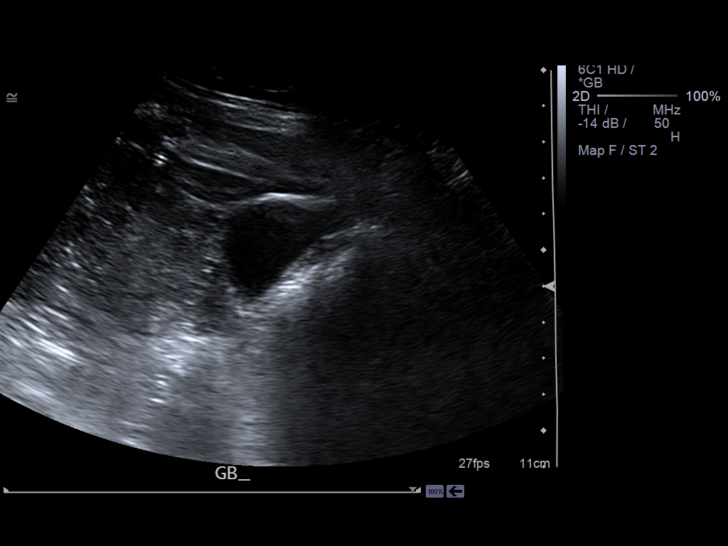

[14 of 25 positions shown; findings below may reference images not displayed]

FINDINGS: The liver is unremarkable. Hepatopedal flow is identified within
the portal vein.

There is no evidence of pericholecystic fluid, gallstones, sludging,
gallbladder wall thickening, nor common bile duct dilation. The gallbladder
wall thickness is 1.9 mm and the common bile duct measures 5.9  mm in
diameter. The technologist was unable to elicit a sonographic Murphy's sign.
The pancreas is unremarkable.
IMPRESSION: No sonographic evidence of cholecystitis nor cholelithiasis.

## 2012-12-16 ENCOUNTER — Emergency Department: Payer: Self-pay | Admitting: Emergency Medicine

## 2012-12-16 LAB — CBC
HGB: 12.8 g/dL (ref 12.0–16.0)
MCH: 30.8 pg (ref 26.0–34.0)
MCHC: 33.8 g/dL (ref 32.0–36.0)
Platelet: 170 10*3/uL (ref 150–440)
WBC: 11.9 10*3/uL — ABNORMAL HIGH (ref 3.6–11.0)

## 2012-12-16 LAB — BASIC METABOLIC PANEL
Anion Gap: 7 (ref 7–16)
BUN: 15 mg/dL (ref 7–18)
Calcium, Total: 8.7 mg/dL (ref 8.5–10.1)
Chloride: 106 mmol/L (ref 98–107)
Co2: 26 mmol/L (ref 21–32)
Creatinine: 0.73 mg/dL (ref 0.60–1.30)
EGFR (Non-African Amer.): >60
Potassium: 3.4 mmol/L — ABNORMAL LOW (ref 3.5–5.1)

## 2012-12-16 IMAGING — CT CT ABD-PELV W/ CM
1 of 3 series · 14 of 32 positions shown, 19 images · non-contrast
Comparison: none

REASON FOR EXAM: (1) right flank and RUQ pain eval renal or hepatic
pathology; (2) right flank an
COMMENTS:

PROCEDURE:     CT  - CT ABDOMEN / PELVIS  W  - [DATE]  [DATE]
RESULT:
TECHNIQUE: Helical 3 mm sections were obtained from the lung bases through
the pubic symphysis status post intravenous administration of 100 mL of
[KM].

[Series 2: 3mm soft tissue · axial · 0.70mm/px · z∈[-411,-51]mm · 14 of 137 slices shown, 19 images]
[im 9/137  soft-tissue]
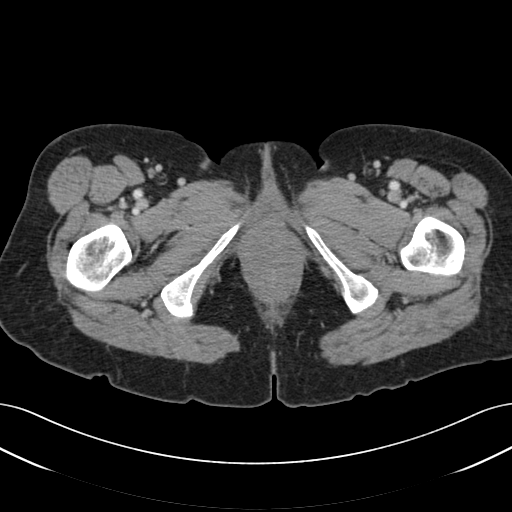
[im 9/137  bone]
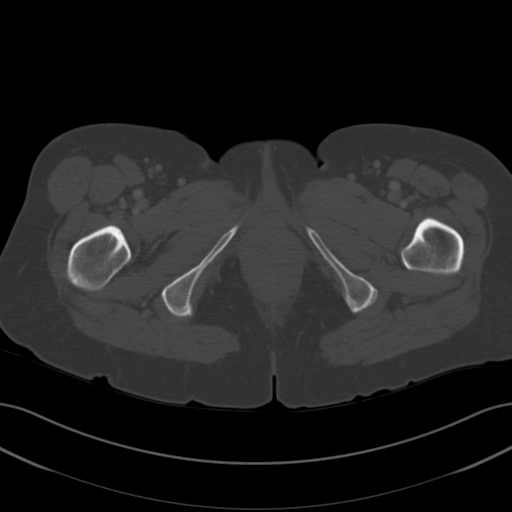
[im 17/137  soft-tissue]
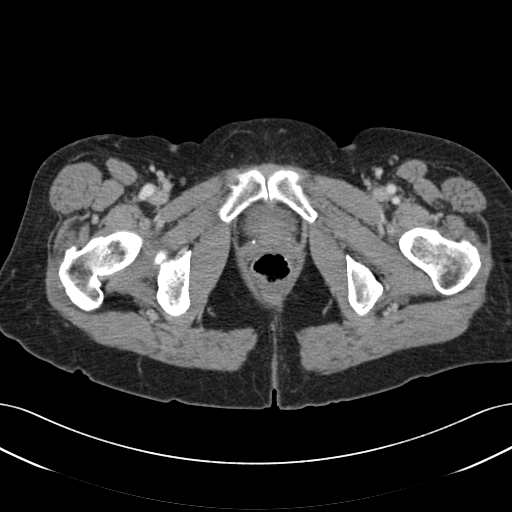
[im 33/137  soft-tissue]
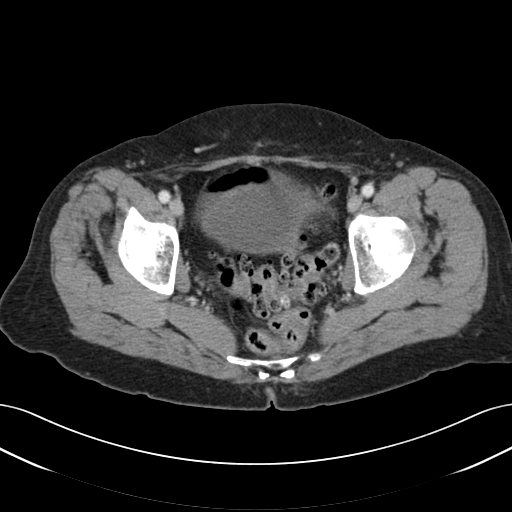
[im 41/137  soft-tissue]
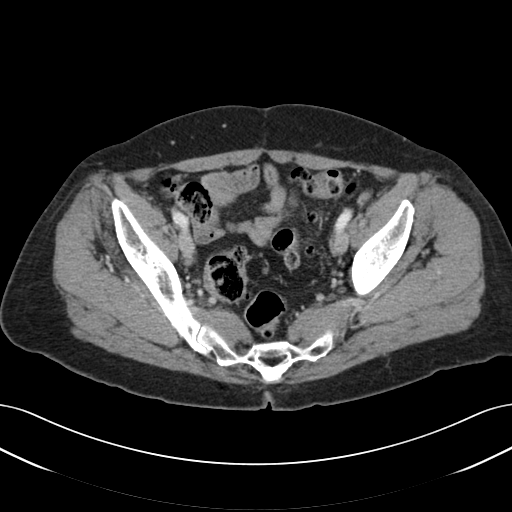
[im 49/137  soft-tissue]
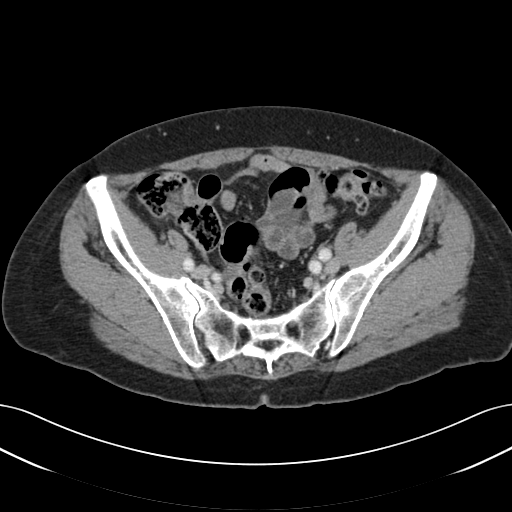
[im 57/137  soft-tissue]
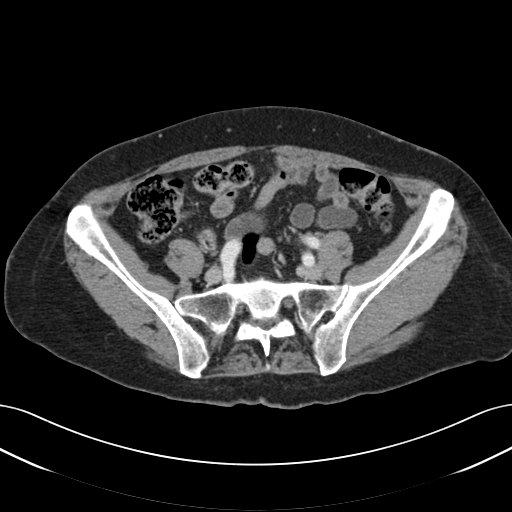
[im 73/137  soft-tissue]
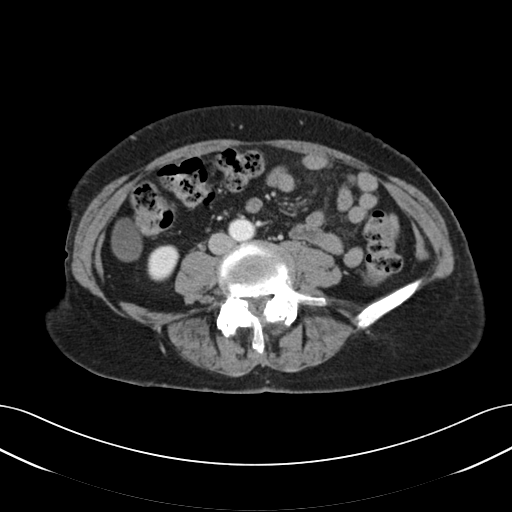
[im 81/137  soft-tissue]
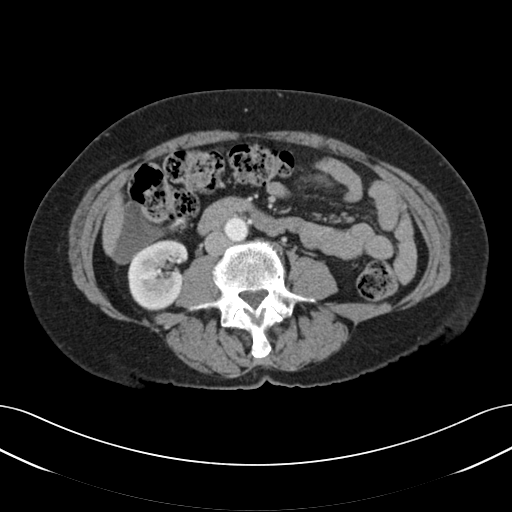
[im 89/137  soft-tissue]
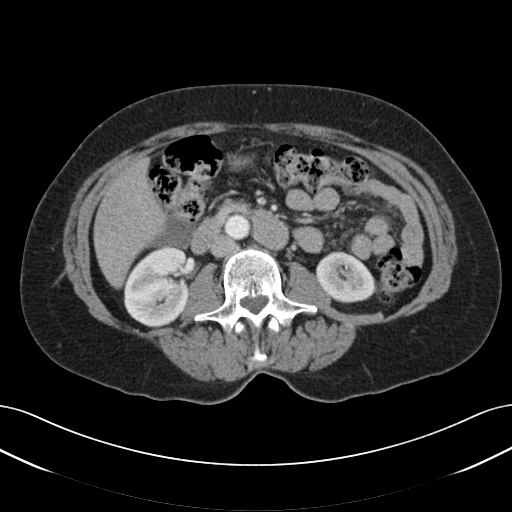
[im 89/137  bone]
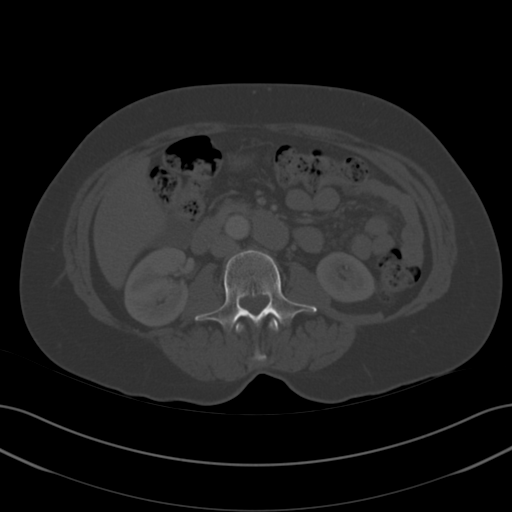
[im 97/137  soft-tissue]
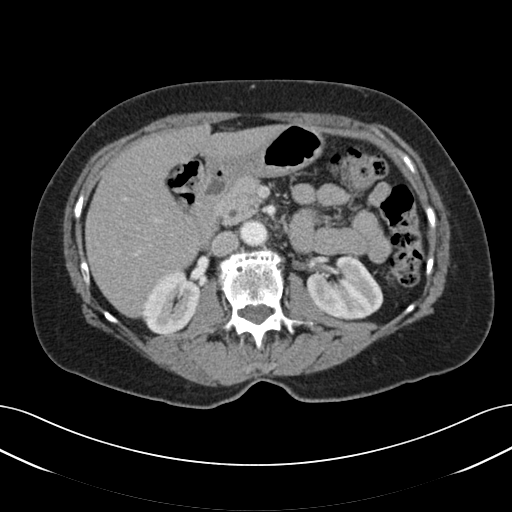
[im 105/137  soft-tissue]
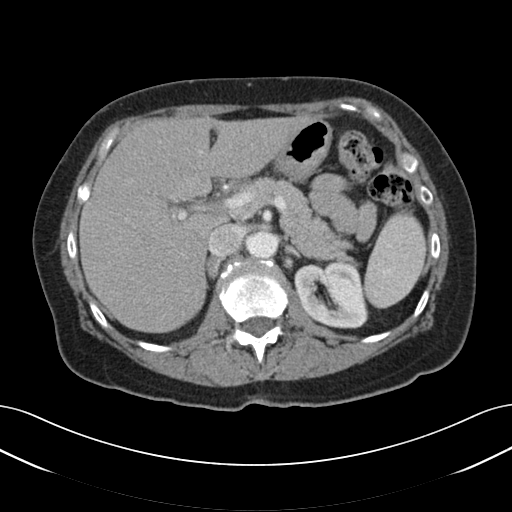
[im 105/137  lung]
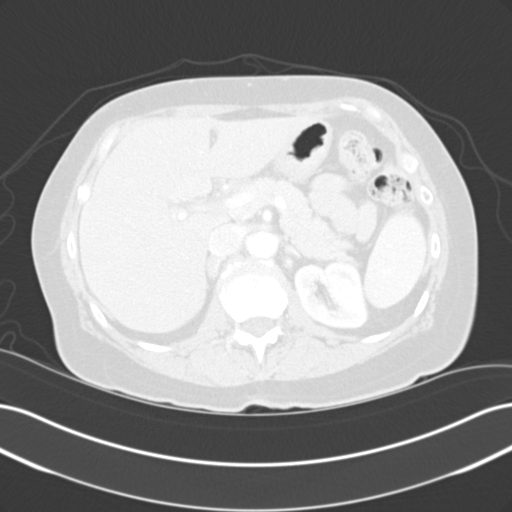
[im 113/137  lung]
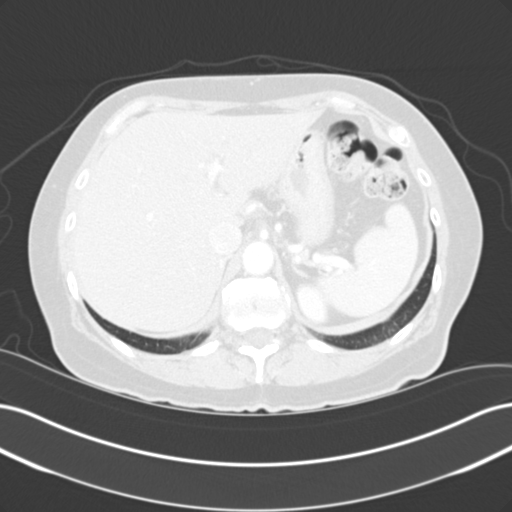
[im 121/137  soft-tissue]
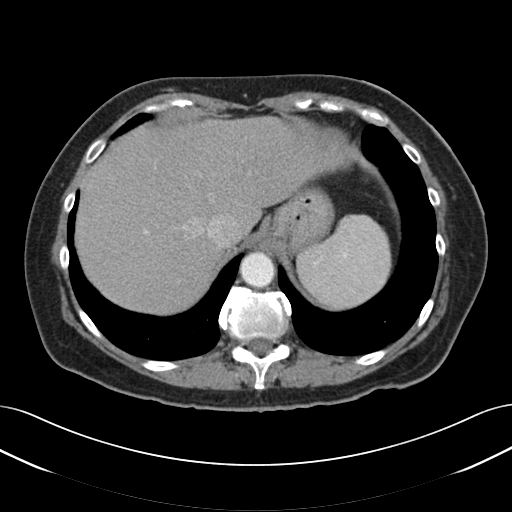
[im 121/137  lung]
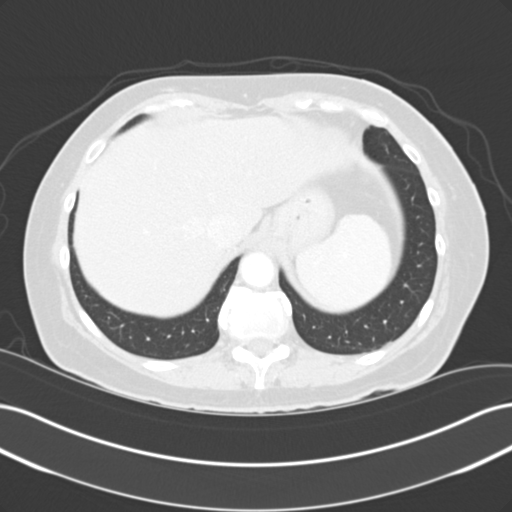
[im 129/137  soft-tissue]
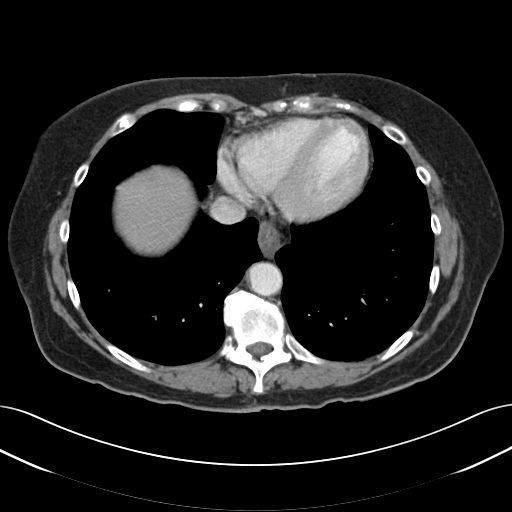
[im 129/137  lung]
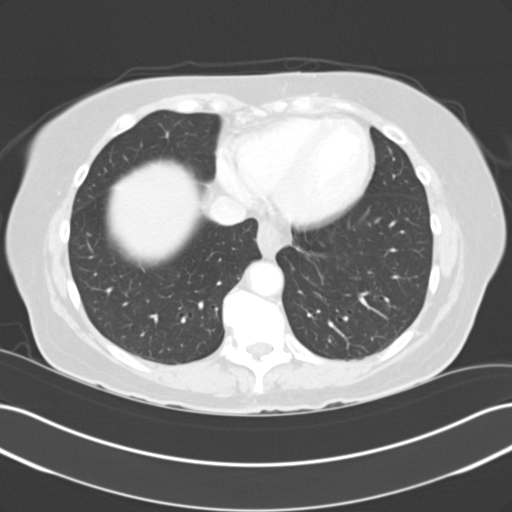

[14 of 32 positions shown; findings below may reference images not displayed]

FINDINGS: Minimal hypoventilation is identified within the medial basal
portion of the right lower lobe.

The liver, spleen and kidneys are unremarkable.

A 1.1 x 0.96 cm low attenuating density is appreciated within the right
adrenal. Hounsfield units measure 55. This is an indeterminate finding. The
left adrenal is unremarkable.
Evaluation of the pancreas demonstrates dilation of the pancreatic duct in
the region of the head of the pancreas measuring up to 5.1 mm in diameter.
No definite focal pancreatic mass is identified. There is no evidence of
peripancreatic inflammation nor fluid to suggest pancreatitis.

There is no evidence of an abdominal aortic aneurysm. The celiac, SMA, IMA,
portal vein, and SMV are opacified.

There is no evidence of bowel obstruction, enteritis, colitis,
diverticulitis, nor appendicitis. There is no evidence of abdominal pelvic
masses, free fluid, loculated fluid collections nor adenopathy. Mild
vascular calcifications are appreciated.
IMPRESSION: 1. Dilation of the pancreatic duct in the region of the head of pancreas. No
obvious obstructing mass is identified. This could represent the sequela of
prior pancreatitis. Additional imaging with MRI is recommended if clinically
warranted to possibly further evaluate the pancreas.
2. Indeterminate nodule right adrenal. Further evaluation with abdominal MRI
is also recommended.
3. Increased fecal content within the colon not mentioned above.
4. No CT evidence of obstructive or inflammatory abnormalities.
5. Dr. BABYLEN of the Emergency Department was informed of these findings
via a preliminary faxed report.

## 2013-01-24 ENCOUNTER — Telehealth: Payer: Self-pay

## 2013-01-24 ENCOUNTER — Other Ambulatory Visit: Payer: Self-pay

## 2013-01-24 ENCOUNTER — Encounter: Payer: Self-pay | Admitting: Gastroenterology

## 2013-01-24 DIAGNOSIS — R933 Abnormal findings on diagnostic imaging of other parts of digestive tract: Secondary | ICD-10-CM

## 2013-01-24 NOTE — Telephone Encounter (Signed)
Pt has been scheduled for an EUS per Dr Niel Hummer referral for 02/22/13 1230 pm pt has been notified and meds reviewed.  She will call with any further questions after reviewing the information mailed to the home

## 2013-02-01 ENCOUNTER — Encounter (HOSPITAL_COMMUNITY): Payer: Self-pay | Admitting: *Deleted

## 2013-02-14 ENCOUNTER — Encounter (HOSPITAL_COMMUNITY): Payer: Self-pay | Admitting: Pharmacy Technician

## 2013-02-22 ENCOUNTER — Encounter (HOSPITAL_COMMUNITY): Payer: Self-pay | Admitting: Anesthesiology

## 2013-02-22 ENCOUNTER — Encounter (HOSPITAL_COMMUNITY): Payer: Self-pay | Admitting: *Deleted

## 2013-02-22 ENCOUNTER — Ambulatory Visit (HOSPITAL_COMMUNITY): Payer: Medicare Other | Admitting: Anesthesiology

## 2013-02-22 ENCOUNTER — Ambulatory Visit (HOSPITAL_COMMUNITY)
Admission: RE | Admit: 2013-02-22 | Discharge: 2013-02-22 | Disposition: A | Discharge status: Home / Self Care | Payer: Medicare Other | Source: Ambulatory Visit | Attending: Gastroenterology | Admitting: Gastroenterology

## 2013-02-22 ENCOUNTER — Encounter (HOSPITAL_COMMUNITY)
Admission: RE | Disposition: A | Discharge status: Home / Self Care | Payer: Self-pay | Source: Ambulatory Visit | Attending: Gastroenterology

## 2013-02-22 DIAGNOSIS — Z800 Family history of malignant neoplasm of digestive organs: Secondary | ICD-10-CM | POA: Insufficient documentation

## 2013-02-22 DIAGNOSIS — R109 Unspecified abdominal pain: Secondary | ICD-10-CM | POA: Insufficient documentation

## 2013-02-22 DIAGNOSIS — R933 Abnormal findings on diagnostic imaging of other parts of digestive tract: Secondary | ICD-10-CM

## 2013-02-22 DIAGNOSIS — R1110 Vomiting, unspecified: Secondary | ICD-10-CM | POA: Insufficient documentation

## 2013-02-22 DIAGNOSIS — I10 Essential (primary) hypertension: Secondary | ICD-10-CM | POA: Insufficient documentation

## 2013-02-22 DIAGNOSIS — R52 Pain, unspecified: Secondary | ICD-10-CM | POA: Insufficient documentation

## 2013-02-22 DIAGNOSIS — K219 Gastro-esophageal reflux disease without esophagitis: Secondary | ICD-10-CM | POA: Insufficient documentation

## 2013-02-22 HISTORY — DX: Personal history of other medical treatment: Z92.89

## 2013-02-22 HISTORY — DX: Migraine, unspecified, not intractable, without status migrainosus: G43.909

## 2013-02-22 HISTORY — DX: Other specified postprocedural states: Z98.890

## 2013-02-22 HISTORY — DX: Personal history of other diseases of the digestive system: Z87.19

## 2013-02-22 HISTORY — PX: EUS: SHX5427

## 2013-02-22 HISTORY — DX: Essential (primary) hypertension: I10

## 2013-02-22 HISTORY — DX: Personal history of other diseases of the circulatory system: Z86.79

## 2013-02-22 HISTORY — DX: Personal history of peptic ulcer disease: Z87.11

## 2013-02-22 HISTORY — DX: Nausea with vomiting, unspecified: R11.2

## 2013-02-22 HISTORY — DX: Cardiac murmur, unspecified: R01.1

## 2013-02-22 HISTORY — DX: Gastro-esophageal reflux disease without esophagitis: K21.9

## 2013-02-22 SURGERY — UPPER ENDOSCOPIC ULTRASOUND (EUS) LINEAR
Anesthesia: Monitor Anesthesia Care

## 2013-02-22 MED ORDER — LIDOCAINE HCL (CARDIAC) 20 MG/ML IV SOLN
INTRAVENOUS | Status: DC | PRN
  Administered 2013-02-22: 100 mg via INTRAVENOUS

## 2013-02-22 MED ORDER — BUTAMBEN-TETRACAINE-BENZOCAINE 2-2-14 % EX AERO
INHALATION_SPRAY | CUTANEOUS | Status: DC | PRN
  Administered 2013-02-22: 2 via TOPICAL

## 2013-02-22 MED ORDER — LACTATED RINGERS IV SOLN
INTRAVENOUS | Status: DC
  Administered 2013-02-22: 1000 via INTRAVENOUS

## 2013-02-22 MED ORDER — FENTANYL CITRATE 0.05 MG/ML IJ SOLN
INTRAMUSCULAR | Status: DC | PRN
  Administered 2013-02-22 (×2): 50 ug via INTRAVENOUS

## 2013-02-22 MED ORDER — MIDAZOLAM HCL 5 MG/5ML IJ SOLN
INTRAMUSCULAR | Status: DC | PRN
  Administered 2013-02-22: 2 mg via INTRAVENOUS

## 2013-02-22 MED ORDER — SODIUM CHLORIDE 0.9 % IV SOLN: INTRAVENOUS | Status: DC

## 2013-02-22 MED ORDER — PROPOFOL 10 MG/ML IV EMUL
INTRAVENOUS | Status: DC | PRN
  Administered 2013-02-22: 140 ug/kg/min via INTRAVENOUS

## 2013-02-22 NOTE — Anesthesia Postprocedure Evaluation (Signed)
Anesthesia Post Note  Patient: Carrie [name]  Procedure(s) Performed: Procedure(s) (LRB): UPPER ENDOSCOPIC ULTRASOUND (EUS) LINEAR (N/A)  Anesthesia type: MAC  Patient location: PACU  Post pain: Pain level controlled  Post assessment: Post-op Vital signs reviewed  Last Vitals: BP 171/81  Pulse 77  Temp(Src) 36.8 C (Oral)  Resp 15  Ht 5\' 2"  (1.575 m)  Wt 135 lb (61.236 kg)  BMI 24.69 kg/m2  SpO2 100%  Post vital signs: Reviewed  Level of consciousness: awake  Complications: No apparent anesthesia complications

## 2013-02-22 NOTE — Transfer of Care (Signed)
Immediate Anesthesia Transfer of Care Note  Patient: Carrie [name]  Procedure(s) Performed: Procedure(s): UPPER ENDOSCOPIC ULTRASOUND (EUS) LINEAR (N/A)  Patient Location: PACU  Anesthesia Type:MAC  Level of Consciousness: awake, alert  and oriented  Airway & Oxygen Therapy: Patient Spontanous Breathing and Patient connected to nasal cannula oxygen  Post-op Assessment: Report given to PACU RN and Post -op Vital signs reviewed and stable  Post vital signs: Reviewed and stable  Complications: No apparent anesthesia complications

## 2013-02-22 NOTE — Op Note (Signed)
[facility] [address] [place], [address]   ENDOSCOPIC ULTRASOUND PROCEDURE REPORT  PATIENT: Carrie [name], Carrie [name]  MR#: [id number] BIRTHDATE: 1946-09-27  GENDER: Female ENDOSCOPIST: Rachael Fee, MD REFERRED BY:  Lurline Del, MD PROCEDURE DATE:  02/22/2013 PROCEDURE:   Upper EUS ASA CLASS:      Class II INDICATIONS:   recent acute abdominal pains, improved; workup (cbc, cmet, amylase all normal).  CT suggested 5mm pancreatic duct in head.  She has a strong FH of pancreatic cancer (mother, brother, maternal GM). MEDICATIONS: MAC sedation, administered by CRNA  DESCRIPTION OF PROCEDURE:   After the risks benefits and alternatives of the procedure were  explained, informed consent was obtained. The patient was then placed in the left, lateral, decubitus postion and IV sedation was administered. Throughout the procedure, the patients blood pressure, pulse and oxygen saturations were monitored continuously.  Under direct visualization, the Pentax Radial EUS [id number]  endoscope was introduced through the mouth  and advanced to the second portion of the duodenum .  Water was used as necessary to provide an acoustic interface.  Upon completion of the imaging, water was removed and the patient was sent to the recovery room in satisfactory condition.   Endoscopyc findings: 1.Normal UGI tract  EUS findings: 1. Normal pancreatic parenchyma throughout head, neck, body and tail 2. Main pancreatic duct was 4mm in head, tapered smoothly throughout body and tail of gland 3. No peripancreatic adenopathy 4. CBD was normal, non-dilated 5. Limited views of liver, spleen, portal and splenic vessels were all normal  Impression: Essentially normal EUS examination.  Would follow clinically.   _______________________________ eSignedRachael Fee, MD 02/22/2013 12:48 PM

## 2013-02-22 NOTE — Anesthesia Preprocedure Evaluation (Addendum)
Anesthesia Evaluation  Patient identified by MRN, date of birth, ID band Patient awake    Reviewed: Allergy & Precautions, H&P , NPO status , Patient's Chart, lab work & pertinent test results  History of Anesthesia Complications (+) PONV  Airway Mallampati: II TM Distance: >3 FB Neck ROM: Full    Dental  (+) Dental Advisory Given   Pulmonary neg pulmonary ROS,          Cardiovascular hypertension, negative cardio ROS  + Valvular Problems/Murmurs Rhythm:Regular Rate:Normal     Neuro/Psych  Headaches, negative psych ROS   GI/Hepatic Neg liver ROS, GERD-  ,  Endo/Other  negative endocrine ROS  Renal/GU negative Renal ROS     Musculoskeletal negative musculoskeletal ROS (+)   Abdominal   Peds  Hematology negative hematology ROS (+)   Anesthesia Other Findings   Reproductive/Obstetrics negative OB ROS                          Anesthesia Physical Anesthesia Plan  ASA: II  Anesthesia Plan: MAC   Post-op Pain Management:    Induction: Intravenous  Airway Management Planned:   Additional Equipment:   Intra-op Plan:   Post-operative Plan:   Informed Consent: I have reviewed the patients History and Physical, chart, labs and discussed the procedure including the risks, benefits and alternatives for the proposed anesthesia with the patient or authorized representative who has indicated his/her understanding and acceptance.   Dental advisory given  Plan Discussed with: CRNA  Anesthesia Plan Comments:         Anesthesia Quick Evaluation

## 2013-02-22 NOTE — Preoperative (Signed)
Beta Blockers   Reason not to administer Beta Blockers:Not Applicable 

## 2013-02-22 NOTE — H&P (Signed)
  HPI: This is a woman with acute abd pain, vomiting.  Outside workup (cbc, cmet, lipase) were all normal.  CT scan outside "showed dilation of the pancreatic duct to 5mm in the head of the pancreas" per referring MD.  Ca 19-9 was normal. No weight loss.    Past Medical History  Diagnosis Date  . History of hypertension     off all meds for last 4 years  . Heart murmur     mild  . Migraines   . History of stomach ulcers   . History of blood transfusion 1998    slight rash with  . PONV (postoperative nausea and vomiting)     just once   . Hypertension   . GERD (gastroesophageal reflux disease)     Past Surgical History  Procedure Laterality Date  . Abdominal hysterectomy  1998  . Benign breast tumor removed Left 1967  . Right benign breast lump removed  2009  . Cataract surgery  8 yrs ago    both eyes    Current Facility-Administered Medications  Medication Dose Route Frequency Provider Last Rate Last Dose  . 0.9 %  sodium chloride infusion   Intravenous Continuous Rachael Fee, MD      . lactated ringers infusion   Intravenous Continuous Rachael Fee, MD 125 mL/hr at 02/22/13 1151 1,000 application at 02/22/13 1151    Allergies as of 01/24/2013  . (No Known Allergies)    History reviewed. No pertinent family history.  History   Social History  . Marital Status: Divorced    Spouse Name: N/A    Number of Children: N/A  . Years of Education: N/A   Occupational History  . Not on file.   Social History Main Topics  . Smoking status: Never Smoker   . Smokeless tobacco: Never Used  . Alcohol Use: No  . Drug Use: No  . Sexually Active: Not on file   Other Topics Concern  . Not on file   Social History Narrative  . No narrative on file      Physical Exam: BP 166/89  Pulse 74  Temp(Src) 98.2 F (36.8 C) (Oral)  Resp 14  Ht 5\' 2"  (1.575 m)  Wt 135 lb (61.236 kg)  BMI 24.69 kg/m2  SpO2 97% Constitutional: generally well-appearing Psychiatric:  alert and oriented x3 Abdomen: soft, nontender, nondistended, no obvious ascites, no peritoneal signs, normal bowel sounds     Assessment and plan: 67 y.o. female with "dilation of the pancreatic duct"  For upper EUS

## 2013-02-23 ENCOUNTER — Encounter (HOSPITAL_COMMUNITY): Payer: Self-pay | Admitting: Gastroenterology

## 2013-08-30 ENCOUNTER — Ambulatory Visit: Payer: Self-pay | Admitting: Internal Medicine

## 2013-08-30 IMAGING — MG MM DIGITAL SCREENING BILAT W/ CAD
1 series · 4 of 4 positions shown · non-contrast
Comparison: Previous exam(s).

CLINICAL DATA: Screening.

EXAM:
DIGITAL SCREENING BILATERAL MAMMOGRAM WITH CAD

[R CC · right · 4 of 4 slices shown]
[im 1/4]
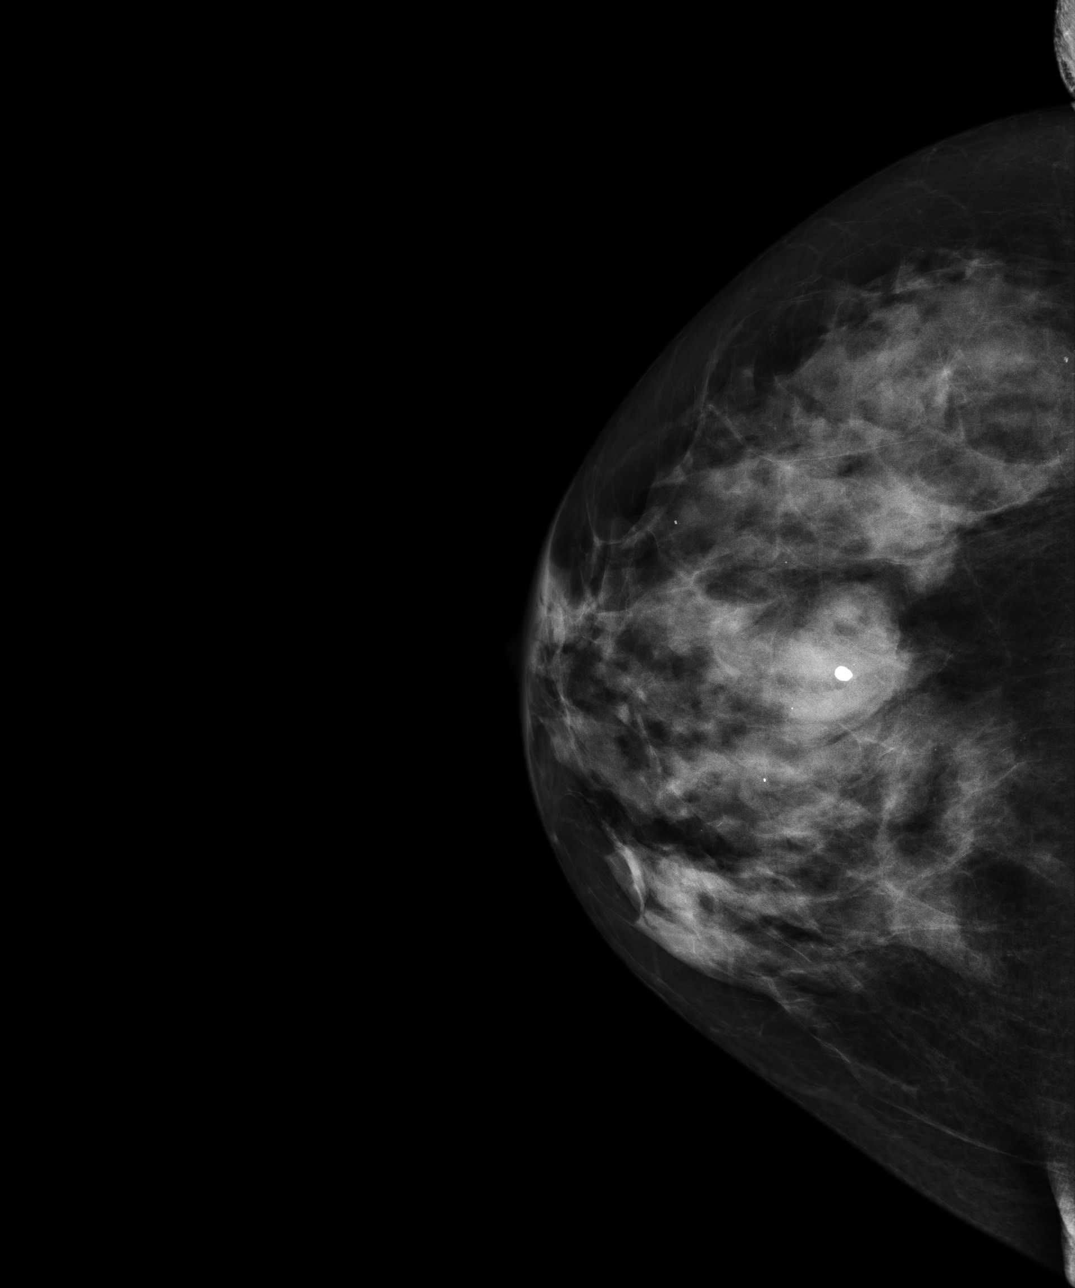
[im 2/4]
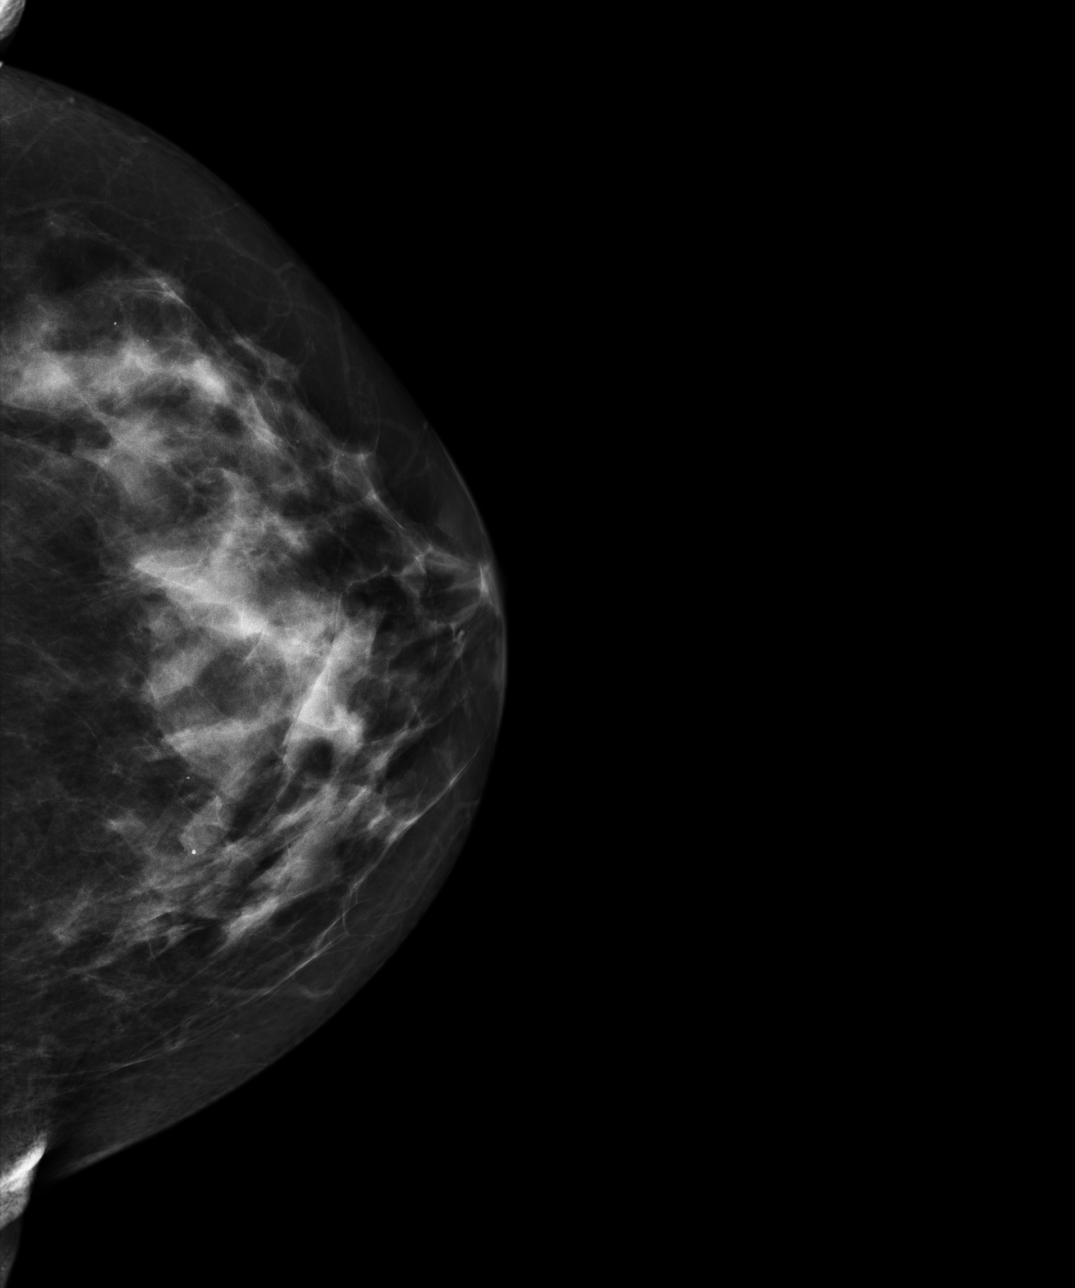
[im 3/4]
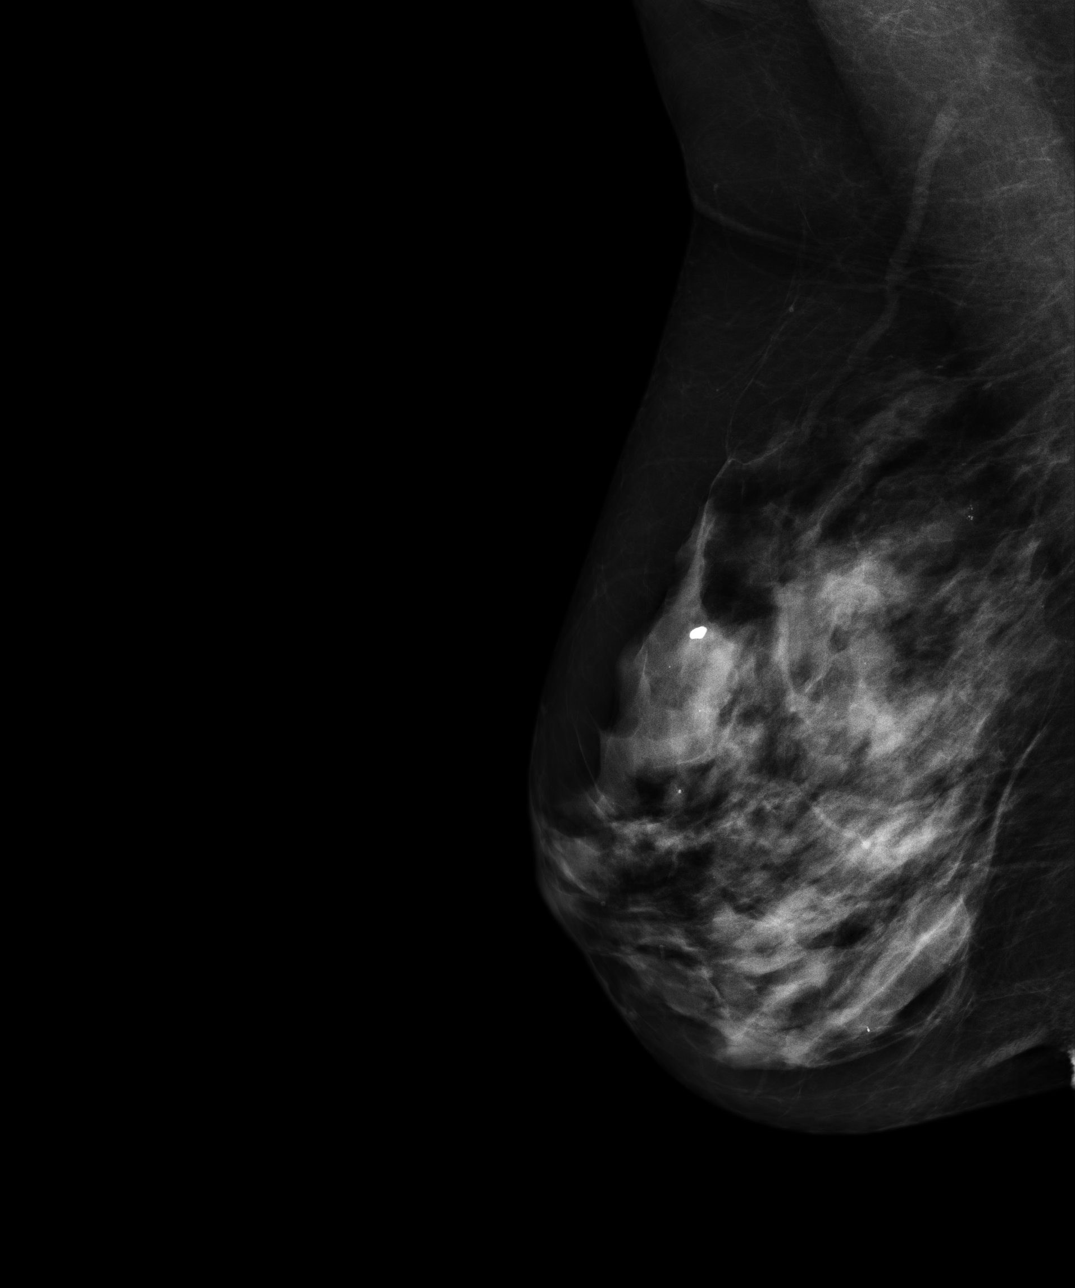
[im 4/4]
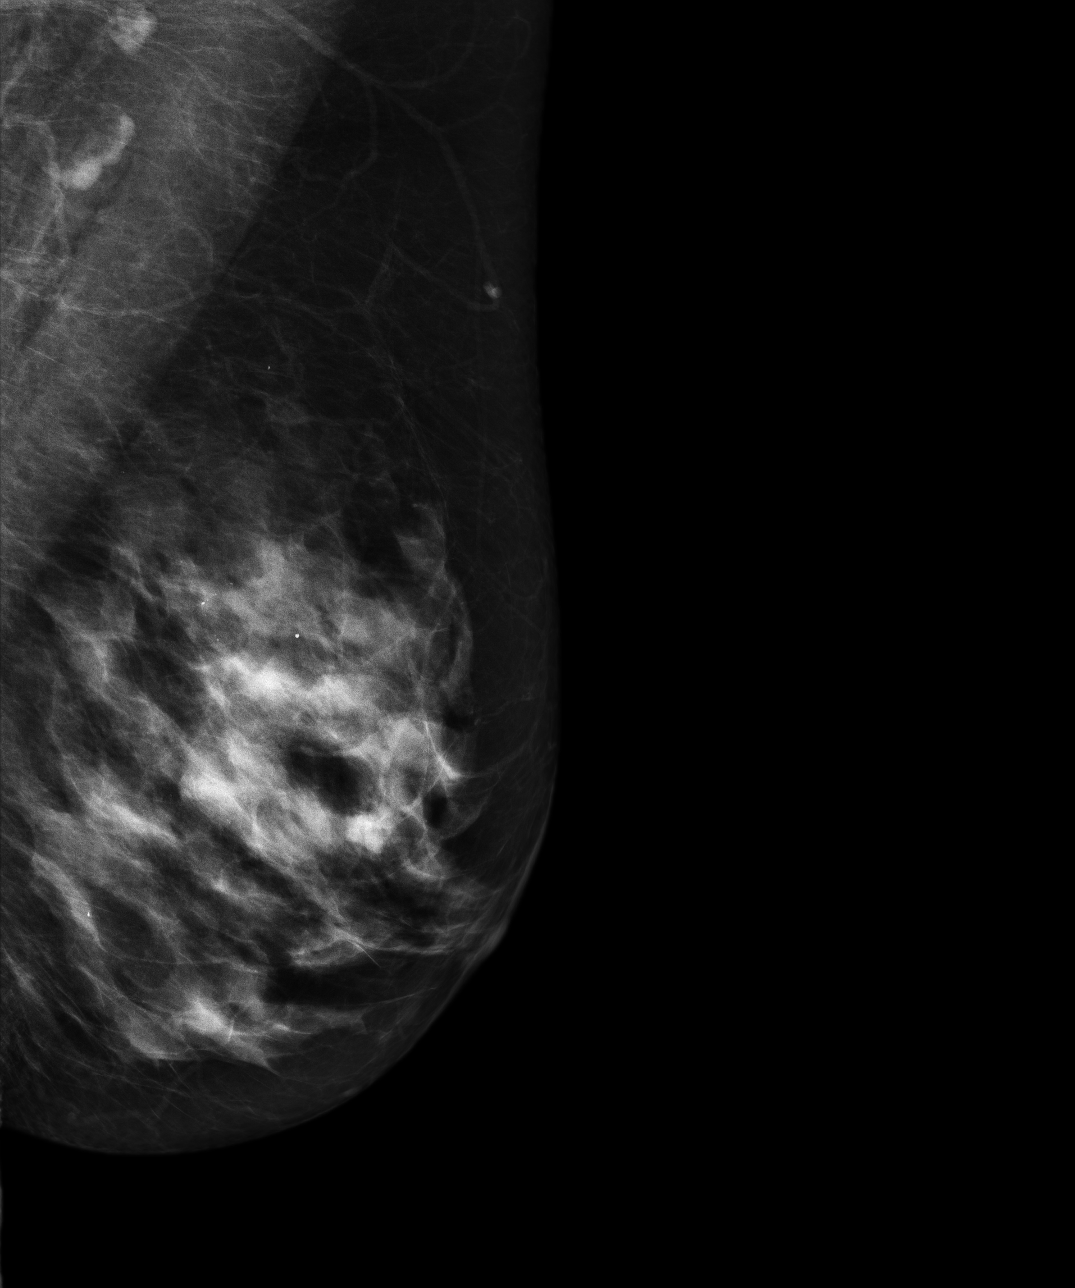

[4 of 4 positions shown; findings below may reference images not displayed]

ACR Breast Density Category d: The breasts are extremely dense,
which lowers the sensitivity of mammography.
FINDINGS: There are no findings suspicious for malignancy. Images were
processed with CAD.
IMPRESSION: No mammographic evidence of malignancy. A result letter of this
screening mammogram will be mailed directly to the patient.

RECOMMENDATION:
Screening mammogram in one year. (Code:[0P])

BI-RADS CATEGORY  1: Negative

## 2013-11-01 HISTORY — PX: COLON RESECTION: SHX5231

## 2014-02-02 ENCOUNTER — Emergency Department: Payer: Self-pay | Admitting: Emergency Medicine

## 2014-02-02 LAB — WET PREP, GENITAL

## 2014-04-09 ENCOUNTER — Ambulatory Visit: Payer: Self-pay | Admitting: Surgery

## 2014-04-09 IMAGING — CT CT ABD-PELV W/ CM
2 of 5 series · 15 of 46 positions shown, 17 images · IV contrast (isovue)
Comparison: CT [DATE]

CLINICAL DATA: 68-year-old with vaginal discharge. Evaluate for a
colovaginal fistula.

EXAM:
CT ABDOMEN AND PELVIS WITH CONTRAST
TECHNIQUE: Multidetector CT imaging of the abdomen and pelvis was performed
using the standard protocol following bolus administration of
intravenous contrast. This study was also performed with rectal
contrast.
CONTRAST:  100 mL Isovue 370

[Series 2: routine abd pel with · axial · 0.68mm/px · z∈[-846,-466]mm · 12 of 86 slices shown, 14 images]
[im 5/86  soft-tissue]
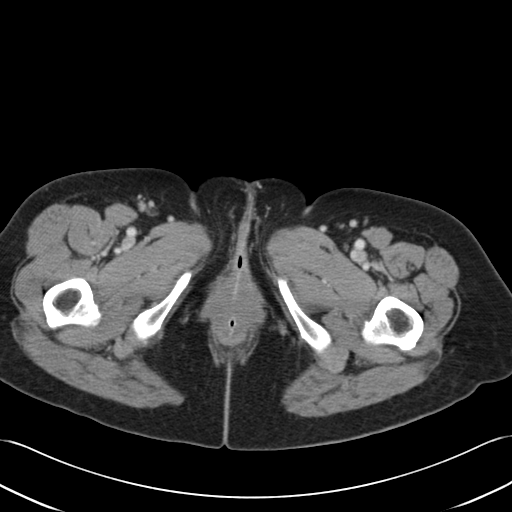
[im 5/86  bone]
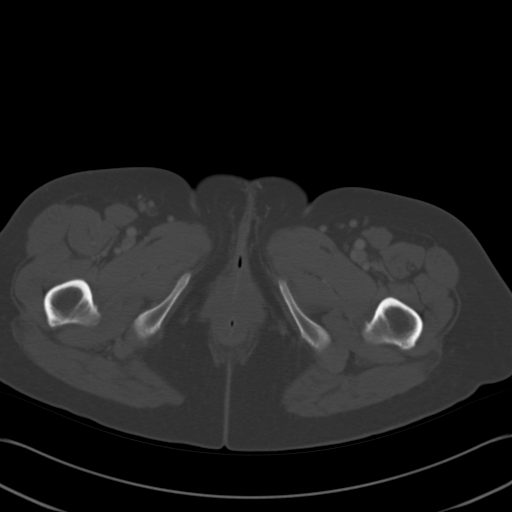
[im 14/86  soft-tissue]
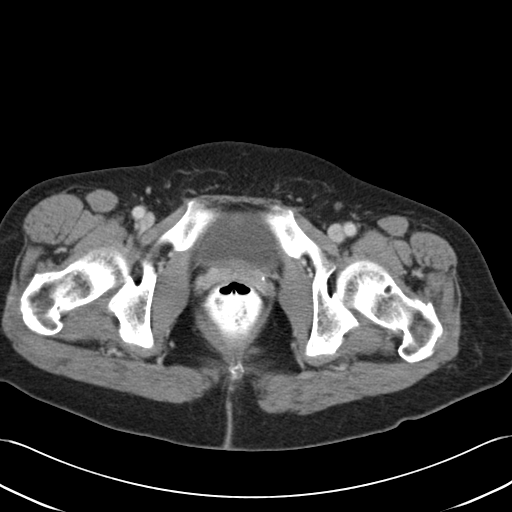
[im 18/86  soft-tissue]
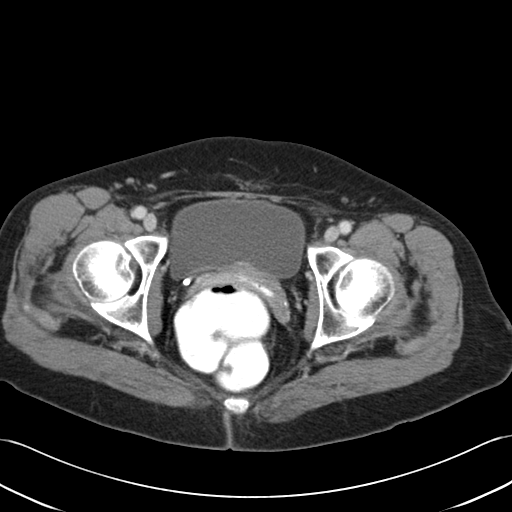
[im 27/86  soft-tissue]
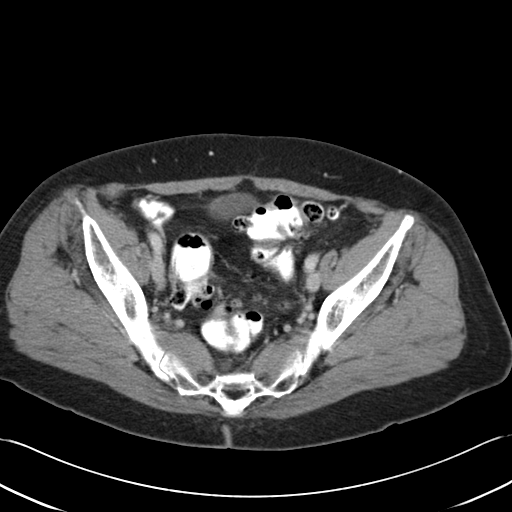
[im 32/86  soft-tissue]
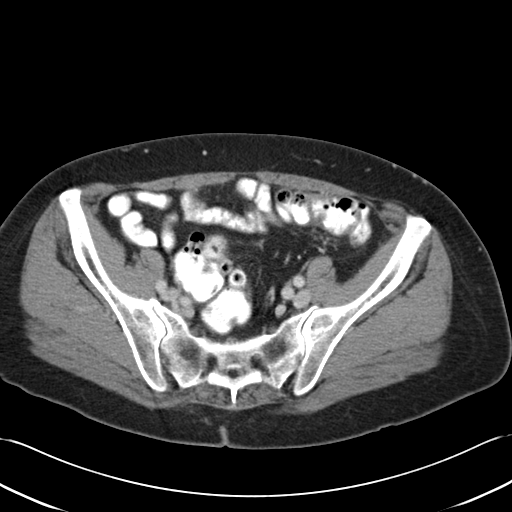
[im 41/86  soft-tissue]
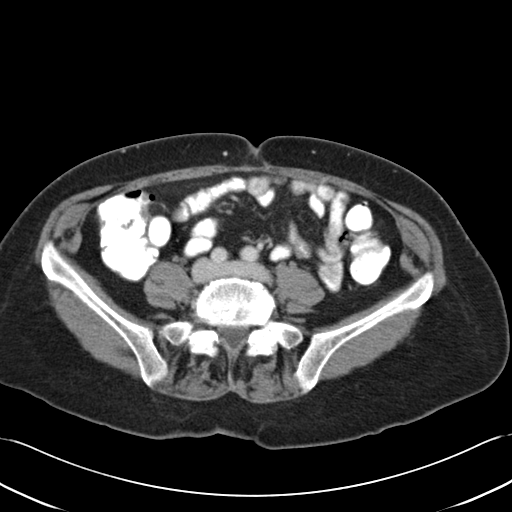
[im 45/86  soft-tissue]
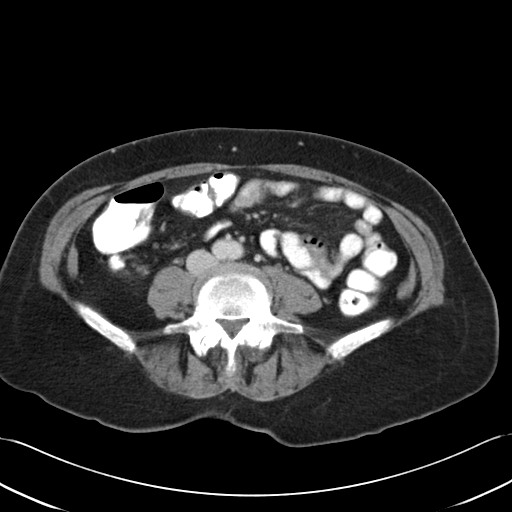
[im 54/86  soft-tissue]
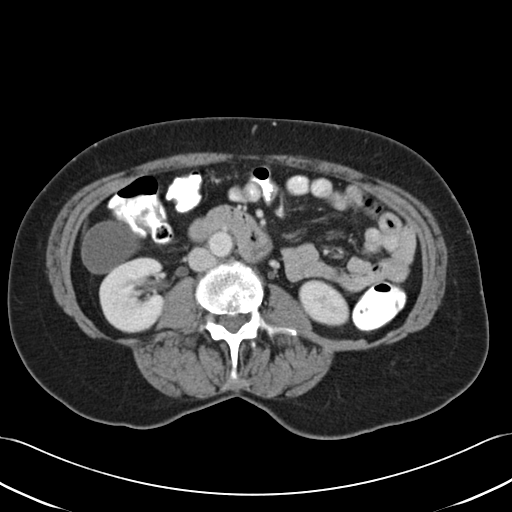
[im 59/86  soft-tissue]
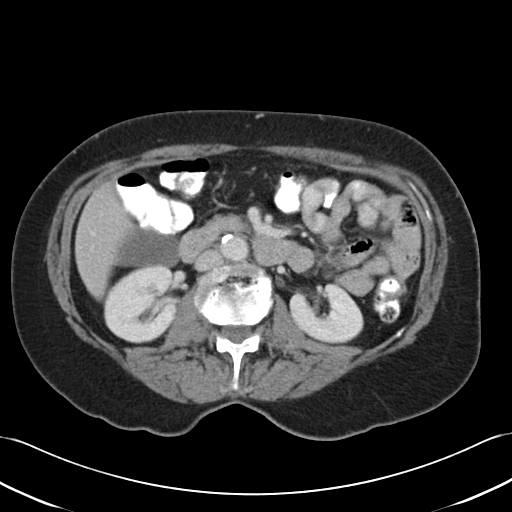
[im 59/86  bone]
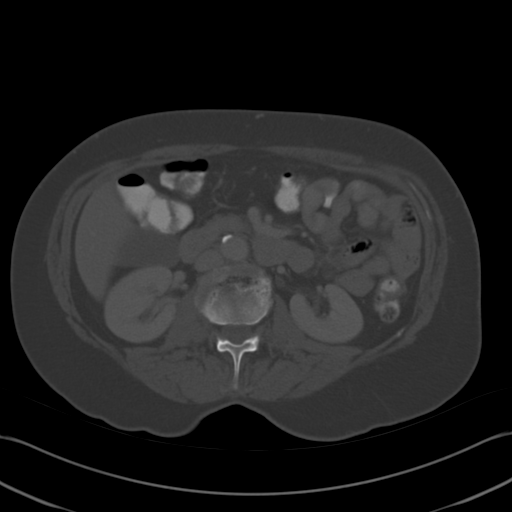
[im 68/86  soft-tissue]
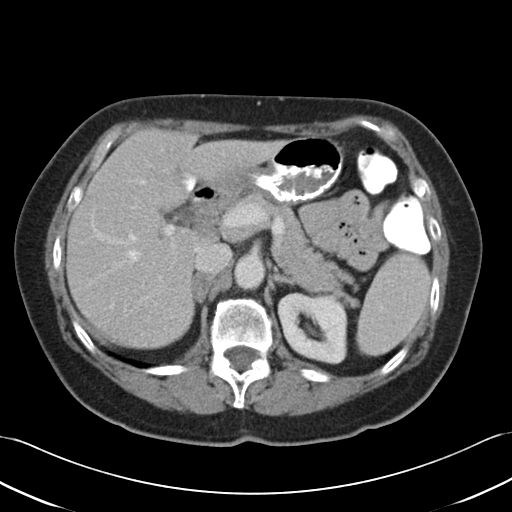
[im 72/86  soft-tissue]
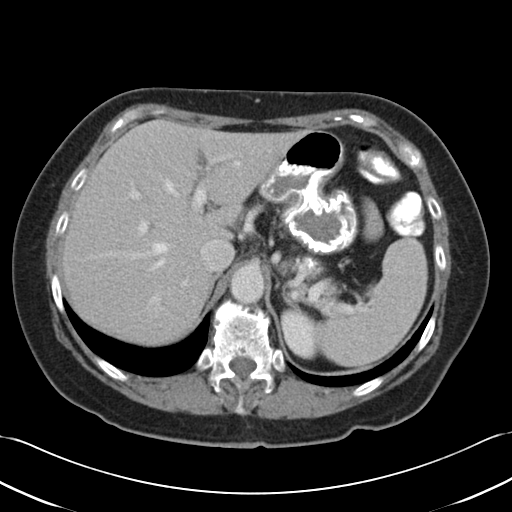
[im 81/86  soft-tissue]
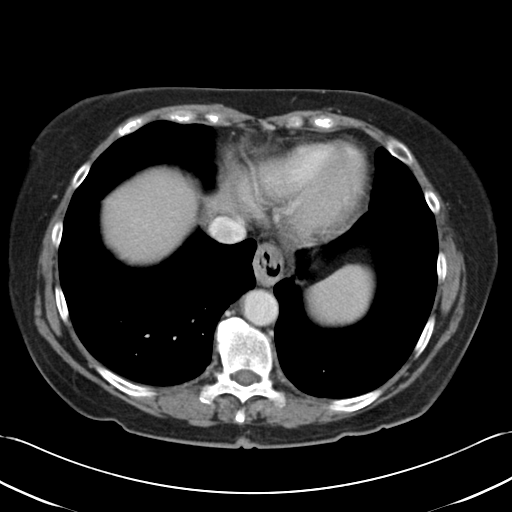

[Series 5: cor routine abd pel with · coronal · 0.59mm/px · 3 of 130 slices shown]
[im 44/130  soft-tissue]
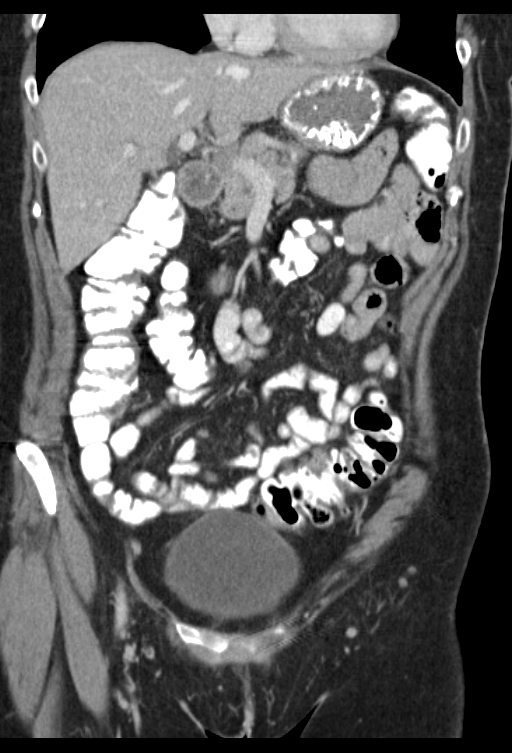
[im 58/130  soft-tissue]
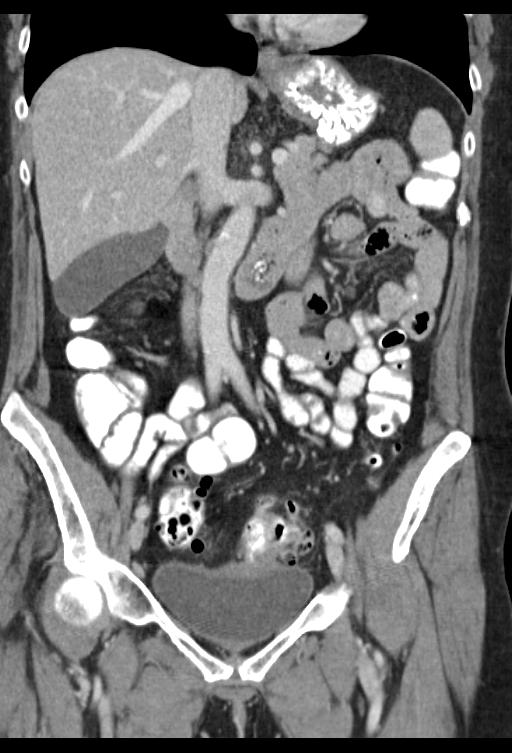
[im 72/130  soft-tissue]
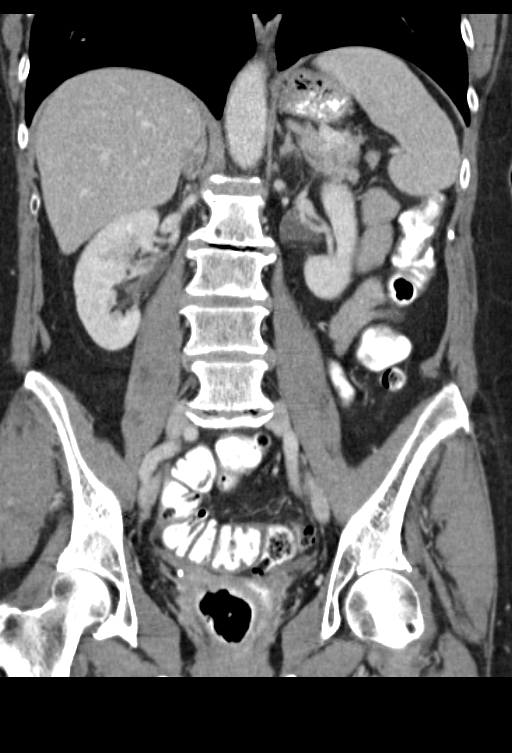

[15 of 46 positions shown; findings below may reference images not displayed]

FINDINGS: Lung bases are clear.  No evidence for free intraperitoneal air.

There is contrast within the vagina. There are multiple diverticula
throughout the sigmoid colon. The mid sigmoid colon lies on top of
the vagina and contains multiple diverticula. Findings are
compatible with a colovaginal fistula. The location of the
colovaginal fistula is best seen on the coronal images, sequence 5,
image 62. This area is also demonstrated on sequence 2, image 66.
There is no significant inflammation around the sigmoid colon.
Normal appearance of the urinary bladder. There is no evidence for
contrast or gas within the urinary bladder. Normal appearance of the
small bowel. No significant free fluid or lymphadenopathy. The
uterus has been removed. Adnexa tissue is not confidently
identified.

Normal appearance of the liver, gallbladder and portal venous
system. Normal appearance of the pancreas, spleen, left adrenal
gland and kidneys. The previous examination raised concern for
pancreatic duct dilatation. The pancreatic duct is less conspicuous
on this examination. There is enlargement of the right adrenal gland
consistent with a nodule. Right adrenal gland measures up to 1.5 cm
and unchanged from the previous examination. The Hounsfield units of
this adrenal lesion roughly measures 20 on the delayed images. There
is a small hiatal hernia.

Degenerative facet disease in the lower lumbar spine. Disc space
narrowing at L5-S1 and L2-L3. Mild anterolisthesis at L4-L5 appears
to be secondary to facet arthropathy. No acute bone abnormality.
IMPRESSION: Study is positive for a colovaginal fistula. There appears to be a
connection between a sigmoid colon diverticulum and the vagina. No
significant inflammation or free fluid at the fistula site.

Colonic diverticulosis.

Stable right adrenal nodule. Nodule remains nonspecific but likely
benign based on the stability and size.

These results will be called to the ordering clinician or
representative by the Radiologist Assistant, and communication
documented in the PACS or zVision Dashboard.

## 2014-04-11 ENCOUNTER — Ambulatory Visit: Payer: Self-pay | Admitting: Surgery

## 2014-04-11 LAB — CBC
HCT: 37.1 % (ref 35.0–47.0)
HGB: 12.6 g/dL (ref 12.0–16.0)
MCH: 31.5 pg (ref 26.0–34.0)
MCHC: 33.9 g/dL (ref 32.0–36.0)
MCV: 93 fL (ref 80–100)
PLATELETS: 207 10*3/uL (ref 150–440)
RBC: 3.99 10*6/uL (ref 3.80–5.20)
RDW: 13.5 % (ref 11.5–14.5)
WBC: 8.8 10*3/uL (ref 3.6–11.0)

## 2014-04-19 ENCOUNTER — Inpatient Hospital Stay: Payer: Self-pay | Admitting: Surgery

## 2014-04-19 LAB — PLATELET COUNT: Platelet: 188 10*3/uL (ref 150–440)

## 2014-04-23 LAB — PLATELET COUNT: PLATELETS: 219 10*3/uL (ref 150–440)

## 2014-04-24 LAB — PATHOLOGY REPORT

## 2014-09-17 DIAGNOSIS — E7800 Pure hypercholesterolemia, unspecified: Secondary | ICD-10-CM | POA: Insufficient documentation

## 2014-09-17 DIAGNOSIS — I10 Essential (primary) hypertension: Secondary | ICD-10-CM | POA: Insufficient documentation

## 2014-10-17 ENCOUNTER — Ambulatory Visit: Payer: Self-pay | Admitting: Internal Medicine

## 2014-10-17 IMAGING — MG MM DIGITAL SCREENING BILAT W/ CAD
4 series · 4 of 4 positions shown · non-contrast
Comparison: Previous exam(s).

CLINICAL DATA: Screening.

EXAM:
DIGITAL SCREENING BILATERAL MAMMOGRAM WITH CAD

[L MLO]
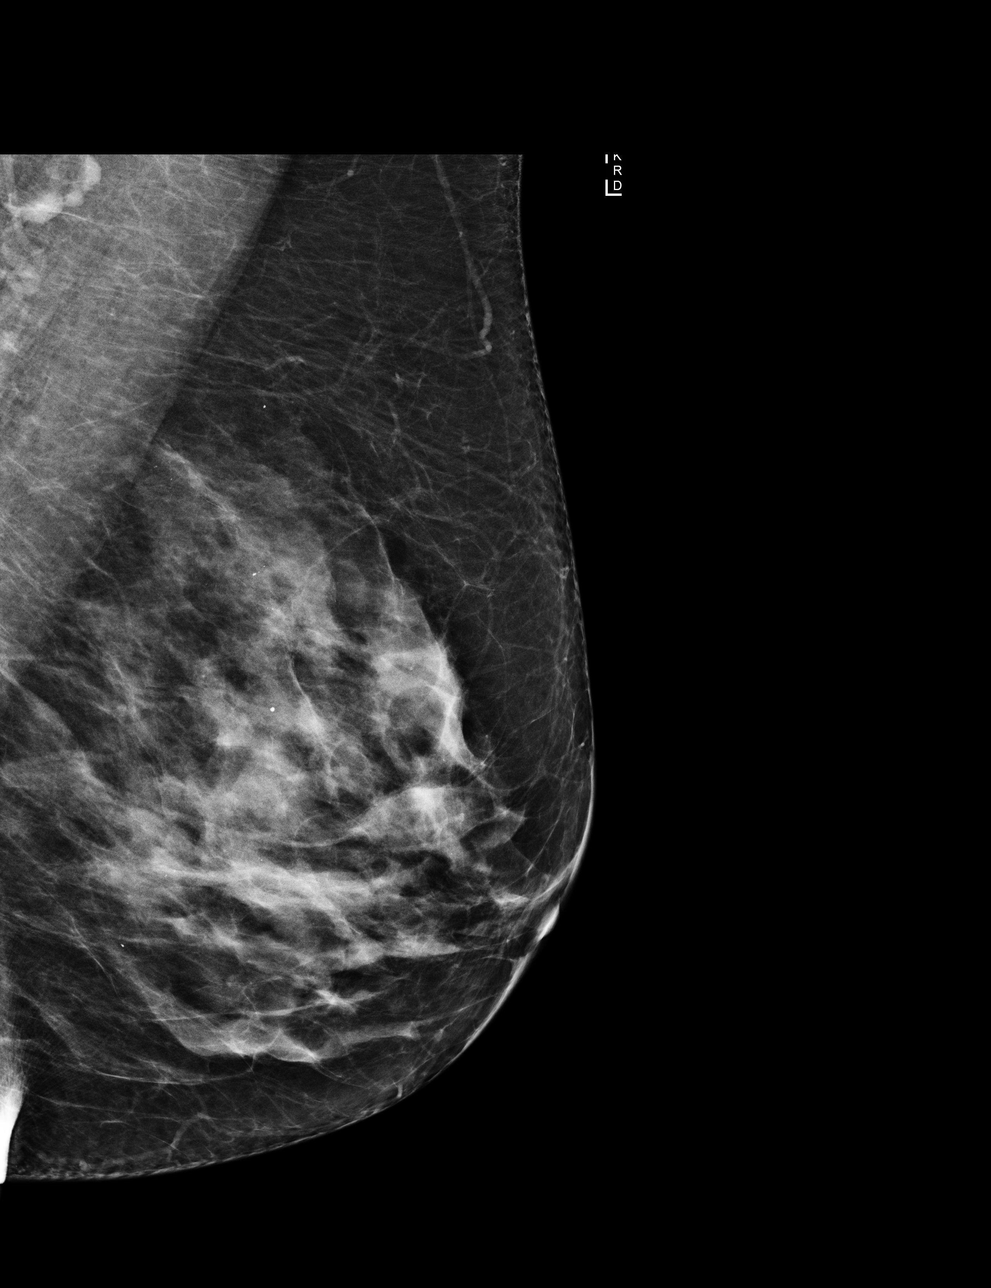

[L CC]
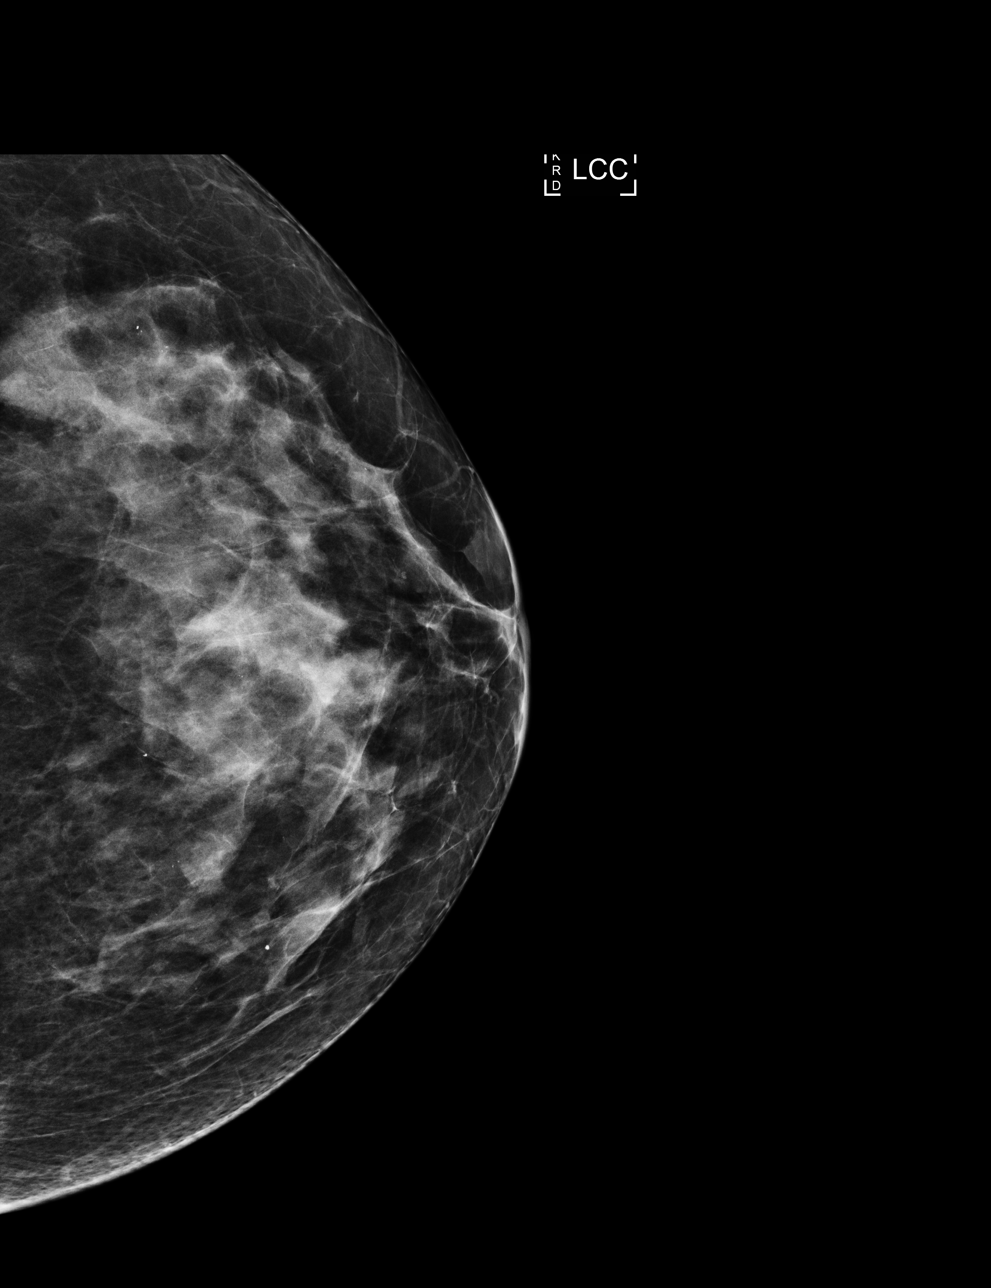

[R MLO]
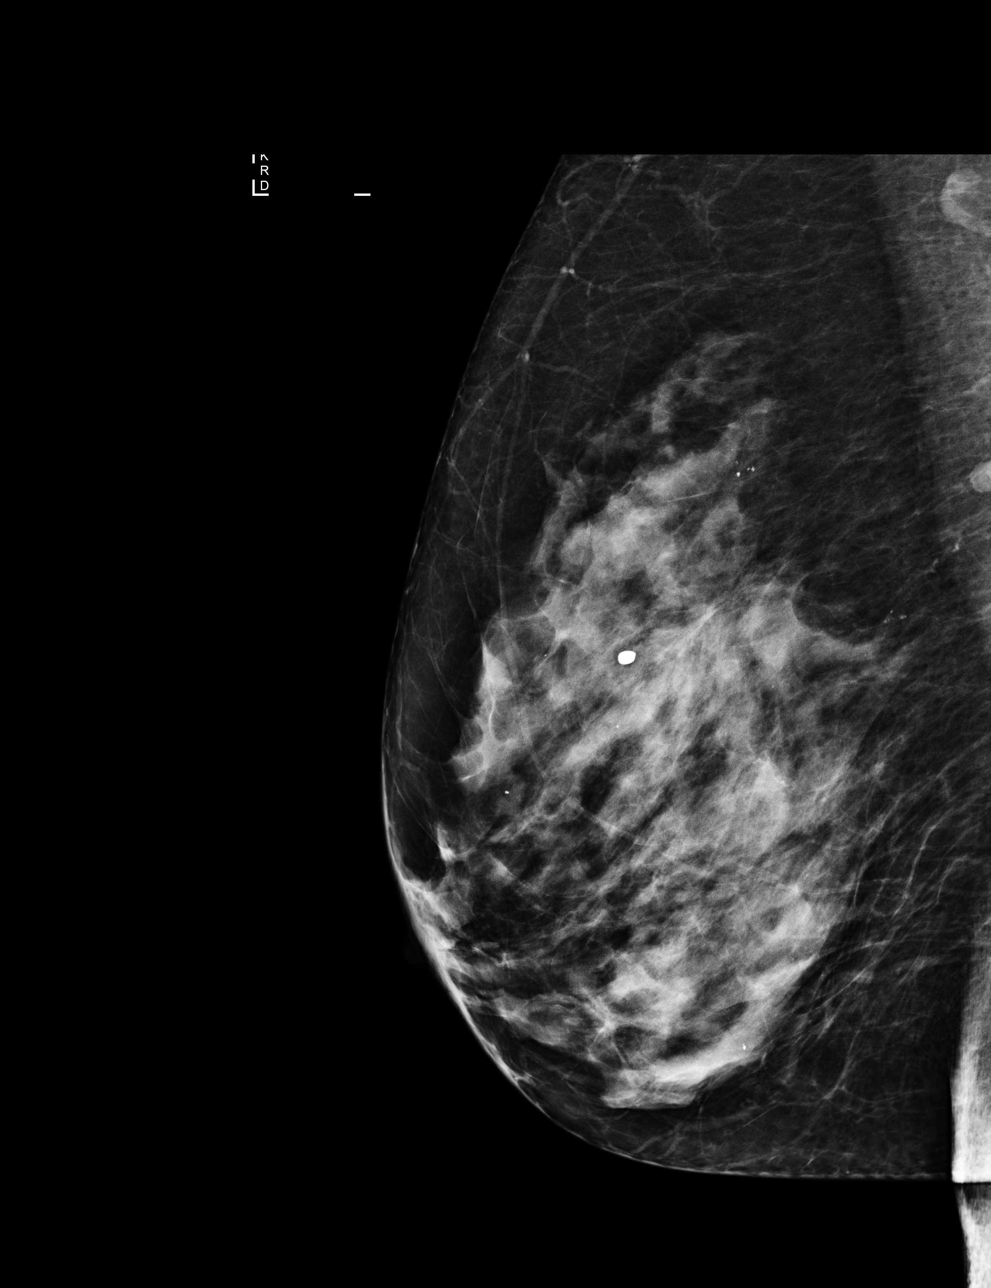

[R CC]
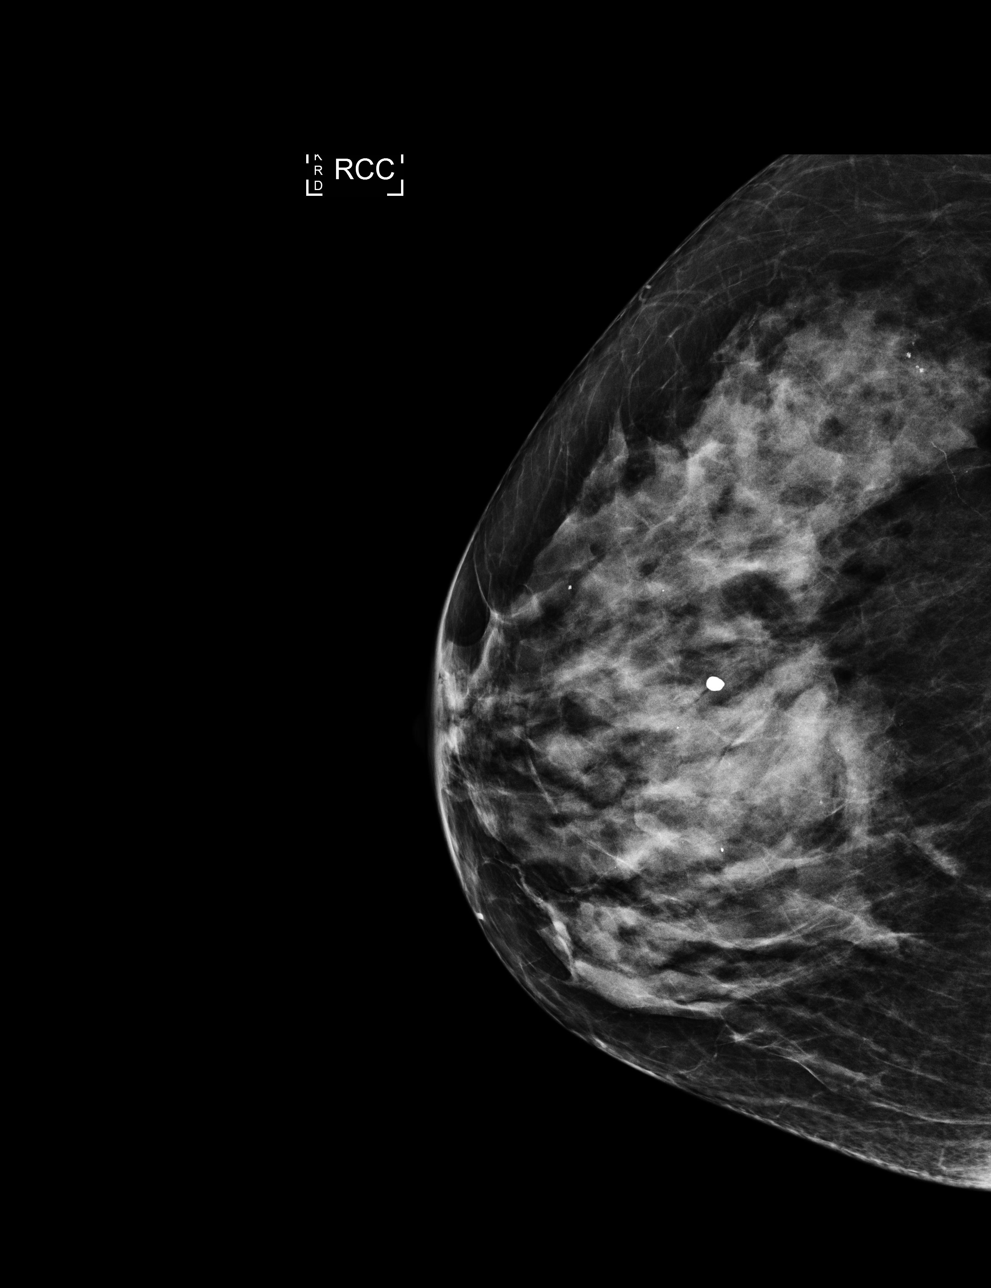

[4 of 4 positions shown; findings below may reference images not displayed]

ACR Breast Density Category c: The breast tissue is heterogeneously
dense, which may obscure small masses.
FINDINGS: There are no findings suspicious for malignancy. Images were
processed with CAD.
IMPRESSION: No mammographic evidence of malignancy. A result letter of this
screening mammogram will be mailed directly to the patient.

RECOMMENDATION:
Screening mammogram in one year. (Code:[0J])

BI-RADS CATEGORY  1: Negative.

## 2015-02-22 NOTE — Op Note (Signed)
PATIENT NAMEKAIMANA, Carrie [name] MR#:  [id number] DATE OF BIRTH:  [birth year]  DATE OF PROCEDURE:  04/19/2014  PREOPERATIVE DIAGNOSIS: Colovaginal fistula, abscess of the left labia majora.   POSTOPERATIVE DIAGNOSIS: Colovaginal fistula, abscess of the left labia majora.   PROCEDURE: Low anterior resection of the sigmoid colon, rigid proctoscopy, incision and drainage of 2 abscesses of the labia majora.  SURGEON: Adella Hare, MD  ANESTHESIA: General.   INDICATIONS: This 69 year old female has a history of feculent discharge from the vagina dating back to February. She was recently seen in the office and set up for a CT scan, which demonstrated extensive diverticulosis and a portion of sigmoid colon adjacent to the vagina, demonstrating presence of colovaginal fistula. It is also noted that she has had a prior hysterectomy, and bilateral oophorectomy and salpingectomy.   DESCRIPTION OF PROCEDURE: With the patient on the operating table in the supine position, she was placed under general anesthesia. The legs were placed into the lithotomy position using bumblebee stirrups. The perineum, including vagina, was prepared with Betadine solution, and the circulating nurse inserted a Foley urinary catheter, draining clear yellow urine.   I also examined the perineum and found that there was a rash consistent with yeast infection. There were also 2 abscesses of the left labia majora. Each abscess appeared to be about 1 cm in dimension. Each of these was lanced with a scalpel, each draining yellow pus. There was some scant bleeding from the incision and drainage site, which resolved with time.   Subsequently, the rigid proctoscope was inserted into the rectum and inserted a distance of 16 cm. The rectal mucosa appeared normal. No polyps or tumors were seen. A small amount of bile was aspirated, and the scope was removed.   Subsequently, the perineal and anal areas were further prepared with Betadine  solution, and the abdomen was prepared with ChloraPrep, and the abdomen was draped out in a sterile manner.   A lower abdominal midline incision was made, which was extended around the left and above the umbilicus. The incision was carried down through subcutaneous tissues. There was an indentation of the skin in the suprapubic area, which appeared to be related to previous surgery. The incision was carried down through the midline fascia, and the peritoneal cavity was opened.   Initial inspection:  The liver was palpated, and there was no palpable mass within the liver. There was no other grossly palpable mass within the proximal portion of the colon.   The sigmoid colon was mobilized with incision of the lateral peritoneal reflection. A Balfour retractor was inserted along with a bladder blade, and could demonstrate that the mid aspect of the sigmoid colon was adherent to the vagina. This was densely adherent. A number of adhesions were taken down surrounding this site, and with blunt finger dissection I could dissect circumferentially around this attachment so that after some dissection, the attachment was approximately 1.5 cm in dimension. Next, this attachment was pried apart with finger pressure and also some use of electrocautery, and the colon was separated from the stump of the vagina. No actual pus was seen, and did not see anything leaking out of the colon at the time.   Next, the sigmoid colon was further mobilized with incision of a number of adhesions. A site proximal to the colovaginal fistula was selected to transect the bowel. This bowel was soft and pliable. A window was created in the mesentery, and inserted the GIA 75 mm stapler  and divided the bowel just approximately 2 inches proximal to the fistula site. Next, the mesenteric dissection was carried out with the Harmonic scalpel. The inferior mesenteric artery was also suture ligated with 0 chromic. The dissection was carried down to the  junction of the sigmoid colon and rectum. The bowel just at the upper aspect of the rectum was divided by placing the TA30 stapler across the bowel, activating and then using a scalpel to cut off the bowel above the staple line. The pathology report requisition was labeled as the staple line on the specimen was proximal, and submitted in formalin for routine pathology.   Next, the pelvic floor was examined. Several small bleeding points were cauterized. Hemostasis subsequently appeared to be intact. The lower end of the remaining bowel was further prepared for anastomosis by excising the staple line using electrocautery. The edges were held with Allis clamps. The EEA sizers were used, easily inserting the 25 mm and the 29 mm sizers. However, the 33 mm sizer was quite snug, so I elected to use the 29 mm EEA stapler for the anastomosis. A pursestring suture of 3-0 Prolene was placed, and the anvil was inserted and the pursestring tied down. Next, the sizers were introduced into the rectum, initially the 25 mm sizer. Next, the 29 mm sizer, and then the 33 mm sizer. Each of these could be passed up to the staple line. Next, the 29 mm EEA stapler was inserted through the rectum and brought up to the staple line, where the pin was advanced just anterior to the staple line and attached to the anvil. The EEA was engaged and activated, held for 30 seconds, disengaged and removed. The proximal anastomotic ring had a defect. The distal anastomotic ring was intact. The anastomosis was inspected, and did find a defect in the anastomosis, which was anterior and on the right side. This was repaired with interrupted 4-0 Vicryl sutures. Following this, the anastomosis was further inspected, and found to be intact and widely patent. The operative area was further examined. The pelvis was irrigated with saline solution. Hemostasis was intact.   Next, gloves, gowns, instruments and towels were exchanged for clean ones. There was  minimal amount of omentum to bring beneath the wound. All lap packs were accounted for. The peritoneum was closed with running 2-0 chromic. The midline fascia was closed with interrupted 0 Maxon figure-of-eight sutures. Skin was closed with clips, placing clips far enough to allow for drainage. Also, the perineal area was further cleaned. and a peripad was applied by the nurse over the site of the abscess. The patient was prepared for transfer to the recovery room.    ____________________________ Carrie CommonsJ. Renda RollsWilton Smith, MD jws:mr D: 04/19/2014 17:40:12 ET T: 04/19/2014 23:11:53 ET JOB#: [id number]  cc: Adella HareJ. Wilton Smith, MD, <Dictator> Adella HareWILTON J SMITH MD ELECTRONICALLY SIGNED 04/23/2014 18:55

## 2015-02-22 NOTE — Discharge Summary (Signed)
PATIENT Carrie [name]:  Carrie [name] MR#:  [id number] DATE OF BIRTH:  [birth year]  DATE OF ADMISSION:  04/19/2014 DATE OF DISCHARGE:  04/23/2014  HISTORY OF PRESENT ILLNESS: This 69 year old female came in for elective sigmoid colon resection. She has a history of several months of feculent discharge from the vagina. Recently had CT scan demonstrating a colovaginal fistula. She did have preop bowel preparation.  Details of history and physical are recorded on the typed H and P.   HOSPITAL COURSE: She was brought in through the outpatient surgery department and carried to the operating room, did have a preop prophylactic antibiotic. She had a rigid proctoscopy which demonstrated no polyp or tumor within the rectum. She also had developed 2 abscesses of the left labia majora and these were both lanced under general anesthesia. She also had laparotomy with low anterior resection of the sigmoid colon, did have findings of colovaginal fistula.   Postoperatively, she received intermittent subcutaneous heparin. She was began on clear liquid diet and gradually advanced to full liquids and to solid food. She did demonstrate bowel function prior to discharge. Her wound progressed satisfactorily.   Her pathology demonstrated diverticulosis with focal changes consistent with diverticulitis, also had dense serosal adhesions. The fistula site was identified during surgery.   FINAL DIAGNOSES: Diverticulosis, diverticulitis, colovaginal fistula, abscesses of the left labia majora.   OPERATION: Low anterior resection of the sigmoid colon, incision and drainage of 2 abscesses of the labia majora, rigid proctoscopy.   Wound care instructions were given and plans made for follow-up in the office. ____________________________ J. Renda RollsWilton Smith, MD jws:sb D: 05/01/2014 19:04:00 ET T: 05/02/2014 08:03:57 ET JOB#: [id number]  cc: Adella HareJ. Wilton Smith, MD, <Dictator> Adella HareWILTON J SMITH MD ELECTRONICALLY SIGNED 05/13/2014 13:01

## 2015-05-30 ENCOUNTER — Encounter: Payer: Self-pay | Admitting: *Deleted

## 2015-05-30 ENCOUNTER — Ambulatory Visit: Payer: Medicare Other | Admitting: Certified Registered"

## 2015-05-30 ENCOUNTER — Ambulatory Visit
Admission: RE | Admit: 2015-05-30 | Discharge: 2015-05-30 | Disposition: A | Discharge status: Home / Self Care | Payer: Medicare Other | Source: Ambulatory Visit | Attending: Gastroenterology | Admitting: Gastroenterology

## 2015-05-30 ENCOUNTER — Encounter
Admission: RE | Disposition: A | Discharge status: Home / Self Care | Payer: Self-pay | Source: Ambulatory Visit | Attending: Gastroenterology

## 2015-05-30 DIAGNOSIS — K5730 Diverticulosis of large intestine without perforation or abscess without bleeding: Secondary | ICD-10-CM | POA: Insufficient documentation

## 2015-05-30 DIAGNOSIS — Z79899 Other long term (current) drug therapy: Secondary | ICD-10-CM | POA: Insufficient documentation

## 2015-05-30 DIAGNOSIS — E780 Pure hypercholesterolemia: Secondary | ICD-10-CM | POA: Insufficient documentation

## 2015-05-30 DIAGNOSIS — I10 Essential (primary) hypertension: Secondary | ICD-10-CM | POA: Insufficient documentation

## 2015-05-30 DIAGNOSIS — Z23 Encounter for immunization: Secondary | ICD-10-CM | POA: Diagnosis not present

## 2015-05-30 DIAGNOSIS — Z8614 Personal history of Methicillin resistant Staphylococcus aureus infection: Secondary | ICD-10-CM | POA: Diagnosis not present

## 2015-05-30 DIAGNOSIS — K219 Gastro-esophageal reflux disease without esophagitis: Secondary | ICD-10-CM | POA: Diagnosis not present

## 2015-05-30 DIAGNOSIS — M159 Polyosteoarthritis, unspecified: Secondary | ICD-10-CM | POA: Diagnosis not present

## 2015-05-30 DIAGNOSIS — M79671 Pain in right foot: Secondary | ICD-10-CM | POA: Diagnosis not present

## 2015-05-30 DIAGNOSIS — E039 Hypothyroidism, unspecified: Secondary | ICD-10-CM | POA: Insufficient documentation

## 2015-05-30 DIAGNOSIS — R011 Cardiac murmur, unspecified: Secondary | ICD-10-CM | POA: Diagnosis not present

## 2015-05-30 DIAGNOSIS — E785 Hyperlipidemia, unspecified: Secondary | ICD-10-CM | POA: Insufficient documentation

## 2015-05-30 DIAGNOSIS — G8929 Other chronic pain: Secondary | ICD-10-CM | POA: Diagnosis not present

## 2015-05-30 DIAGNOSIS — G43909 Migraine, unspecified, not intractable, without status migrainosus: Secondary | ICD-10-CM | POA: Insufficient documentation

## 2015-05-30 DIAGNOSIS — Z1211 Encounter for screening for malignant neoplasm of colon: Secondary | ICD-10-CM | POA: Diagnosis present

## 2015-05-30 HISTORY — PX: COLONOSCOPY WITH PROPOFOL: SHX5780

## 2015-05-30 SURGERY — COLONOSCOPY WITH PROPOFOL
Anesthesia: General

## 2015-05-30 MED ORDER — LIDOCAINE HCL (CARDIAC) 20 MG/ML IV SOLN
INTRAVENOUS | Status: DC | PRN
  Administered 2015-05-30: 50 mg via INTRAVENOUS

## 2015-05-30 MED ORDER — PROPOFOL INFUSION 10 MG/ML OPTIME
INTRAVENOUS | Status: DC | PRN
  Administered 2015-05-30: 120 ug/kg/min via INTRAVENOUS

## 2015-05-30 MED ORDER — PROPOFOL 10 MG/ML IV BOLUS
INTRAVENOUS | Status: DC | PRN
  Administered 2015-05-30: 100 mg via INTRAVENOUS

## 2015-05-30 MED ORDER — SODIUM CHLORIDE 0.9 % IV SOLN
INTRAVENOUS | Status: DC
  Administered 2015-05-30: 1000 mL via INTRAVENOUS

## 2015-05-30 NOTE — Anesthesia Postprocedure Evaluation (Signed)
  Anesthesia Post-op Note  Patient: Carrie [name]  Procedure(s) Performed: Procedure(s): COLONOSCOPY WITH PROPOFOL (N/A)  Anesthesia type:General  Patient location: PACU  Post pain: Pain level controlled  Post assessment: Post-op Vital signs reviewed, Patient's Cardiovascular Status Stable, Respiratory Function Stable, Patent Airway and No signs of Nausea or vomiting  Post vital signs: Reviewed and stable  Last Vitals:  Filed Vitals:   05/30/15 0920  BP: 129/60  Pulse: 58  Temp:   Resp: 17    Level of consciousness: awake, alert  and patient cooperative  Complications: No apparent anesthesia complications

## 2015-05-30 NOTE — Op Note (Signed)
[facility] Gastroenterology Patient Name: Carrie [name] Procedure Date: 05/30/2015 8:16 AM MRN: [id number] Account #: [id number] Date of Birth: 06-06-1946 Admit Type: Outpatient Age: 69 Room: [facility] ENDO ROOM 4 Gender: Female Note Status: Finalized Procedure:         Colonoscopy Indications:       Screening for colorectal malignant neoplasm Providers:         Ezzard Standing. Bluford Kaufmann, MD Referring MD:      Daniel Nones, MD (Referring MD) Medicines:         Monitored Anesthesia Care Complications:     No immediate complications. Procedure:         Pre-Anesthesia Assessment:                    - Prior to the procedure, a History and Physical was                     performed, and patient medications, allergies and                     sensitivities were reviewed. The patient's tolerance of                     previous anesthesia was reviewed.                    - The risks and benefits of the procedure and the sedation                     options and risks were discussed with the patient. All                     questions were answered and informed consent was obtained.                    - After reviewing the risks and benefits, the patient was                     deemed in satisfactory condition to undergo the procedure.                    After obtaining informed consent, the colonoscope was                     passed under direct vision. Throughout the procedure, the                     patient's blood pressure, pulse, and oxygen saturations                     were monitored continuously. The Colonoscope was                     introduced through the anus and advanced to the the cecum,                     identified by appendiceal orifice and ileocecal valve. The                     colonoscopy was performed without difficulty. The patient                     tolerated the procedure well. The quality of the bowel  preparation was good. Findings:      Multiple  small and large-mouthed diverticula were found in the sigmoid       colon.      The exam was otherwise without abnormality. Impression:        - Diverticulosis in the sigmoid colon.                    - The examination was otherwise normal.                    - No specimens collected. Recommendation:    - Discharge patient to home.                    - Repeat colonoscopy in 10 years for surveillance.                    - The findings and recommendations were discussed with the                     patient. Procedure Code(s): --- Professional ---                    [id number], Colonoscopy, flexible; diagnostic, including                     collection of specimen(s) by brushing or washing, when                     performed (separate procedure) Diagnosis Code(s): --- Professional ---                    Z12.11, Encounter for screening for malignant neoplasm of                     colon                    K57.30, Diverticulosis of large intestine without                     perforation or abscess without bleeding CPT copyright 2014 American Medical Association. All rights reserved. The codes documented in this report are preliminary and upon coder review may  be revised to meet current compliance requirements. Wallace Cullens, MD 05/30/2015 8:47:28 AM This report has been signed electronically. Number of Addenda: 0 Note Initiated On: 05/30/2015 8:16 AM Scope Withdrawal Time: 0 hours 3 minutes 15 seconds  Total Procedure Duration: 0 hours 8 minutes 9 seconds       [facility]

## 2015-05-30 NOTE — Transfer of Care (Signed)
Immediate Anesthesia Transfer of Care Note  Patient: Carrie [name]  Procedure(s) Performed: Procedure(s): COLONOSCOPY WITH PROPOFOL (N/A)  Patient Location: Endoscopy Unit  Anesthesia Type:General  Level of Consciousness: awake  Airway & Oxygen Therapy: Patient Spontanous Breathing and Patient connected to nasal cannula oxygen  Post-op Assessment: Report given to RN  Post vital signs: Reviewed  Last Vitals:  Filed Vitals:   05/30/15 0851  BP: 110/65  Pulse: 69  Temp: 36.1 C  Resp: 18    Complications: No apparent anesthesia complications

## 2015-05-30 NOTE — Anesthesia Preprocedure Evaluation (Addendum)
Anesthesia Evaluation  Patient identified by MRN, date of birth, ID band Patient awake    Reviewed: Allergy & Precautions, H&P , NPO status , Patient's Chart, lab work & pertinent test results  History of Anesthesia Complications (+) PONV and history of anesthetic complications  Airway Mallampati: III  TM Distance: >3 FB Neck ROM: limited    Dental no notable dental hx. (+) Teeth Intact   Pulmonary neg pulmonary ROS,          Cardiovascular Exercise Tolerance: Good hypertension, + Valvular Problems/Murmurs Rhythm:Regular Rate:Normal     Neuro/Psych  Headaches, negative psych ROS   GI/Hepatic Neg liver ROS, GERD-  ,  Endo/Other  negative endocrine ROS  Renal/GU negative Renal ROS     Musculoskeletal negative musculoskeletal ROS (+)   Abdominal   Peds  Hematology negative hematology ROS (+)   Anesthesia Other Findings Past Medical History:   History of hypertension                                        Comment:off all meds for last 4 years   Heart murmur                                                   Comment:mild   Migraines                                                    History of stomach ulcers                                    History of blood transfusion                    1998           Comment:slight rash with   PONV (postoperative nausea and vomiting)                       Comment:just once    Hypertension                                                 GERD (gastroesophageal reflux disease)                       Reproductive/Obstetrics negative OB ROS                           Anesthesia Physical  Anesthesia Plan  ASA: II  Anesthesia Plan: General   Post-op Pain Management:    Induction: Intravenous  Airway Management Planned:   Additional Equipment:   Intra-op Plan:   Post-operative Plan:   Informed Consent: I have reviewed the patients History and  Physical, chart, labs and discussed the procedure including the risks, benefits and alternatives for the proposed anesthesia with the patient or authorized  representative who has indicated his/her understanding and acceptance.   Dental Advisory Given  Plan Discussed with: CRNA, Anesthesiologist and Surgeon  Anesthesia Plan Comments:         Anesthesia Quick Evaluation

## 2015-06-02 ENCOUNTER — Encounter: Payer: Self-pay | Admitting: Gastroenterology

## 2015-06-04 NOTE — Progress Notes (Signed)
Patient ID: ELLSIE [name], female   DOB: July 07, 1946, 69 y.o.   MRN: [id number]   Primary Care Physician:  Lynnea Ferrier, MD Primary Gastroenterologist:  Dr. Bluford Kaufmann  Pre-Procedure History & Physical: HPI:  Carrie [name] is a 69 y.o. female is here for an colonoscopy.   Past Medical History  Diagnosis Date  . History of hypertension     off all meds for last 4 years  . Heart murmur     mild  . Migraines   . History of stomach ulcers   . History of blood transfusion 1998    slight rash with  . PONV (postoperative nausea and vomiting)     just once   . Hypertension   . GERD (gastroesophageal reflux disease)     Past Surgical History  Procedure Laterality Date  . Abdominal hysterectomy  1998  . Benign breast tumor removed Left 1967  . Right benign breast lump removed  2009  . Cataract surgery  8 yrs ago    both eyes  . Eus N/A 02/22/2013    Procedure: UPPER ENDOSCOPIC ULTRASOUND (EUS) LINEAR;  Surgeon: Rachael Fee, MD;  Location: [facility] ENDOSCOPY;  Service: Endoscopy;  Laterality: N/A;  . Eye surgery    . Colon surgery      8"colon removed  . Colonoscopy with propofol N/A 05/30/2015    Procedure: COLONOSCOPY WITH PROPOFOL;  Surgeon: Wallace Cullens, MD;  Location: [facility] ENDOSCOPY;  Service: Gastroenterology;  Laterality: N/A;    Prior to Admission medications   Medication Sig Start Date End Date Taking? Authorizing Provider  losartan (COZAAR) 50 MG tablet Take 50 mg by mouth daily.   Yes Historical Provider, MD  SUMAtriptan (IMITREX) 50 MG tablet Take 50 mg by mouth every 2 (two) hours as needed for migraine. May repeat in 2 hours if headache persists or recurs.   Yes Historical Provider, MD  MAGNESIUM CITRATE PO Take 1 tablet by mouth daily.     Historical Provider, MD  Multiple Vitamins-Minerals (MULTIVITAMIN WITH MINERALS) tablet Take 1 tablet by mouth daily.    Historical Provider, MD  mupirocin ointment (BACTROBAN) 2 % Place 1 application into the nose 2 (two) times daily.     Historical Provider, MD  omeprazole (PRILOSEC) 20 MG capsule Take 20 mg by mouth every morning.    Historical Provider, MD  topiramate (TOPAMAX) 25 MG tablet Take 25 mg by mouth 2 (two) times daily.    Historical Provider, MD  triamcinolone ointment (KENALOG) 0.1 % Apply 1 application topically 2 (two) times daily.    Historical Provider, MD  zolmitriptan (ZOMIG) 5 MG tablet Take 5 mg by mouth as needed for migraine.    Historical Provider, MD    Allergies as of 04/11/2015  . (No Known Allergies)    History reviewed. No pertinent family history.  History   Social History  . Marital Status: Widowed    Spouse Name: N/A  . Number of Children: N/A  . Years of Education: N/A   Occupational History  . Not on file.   Social History Main Topics  . Smoking status: Never Smoker   . Smokeless tobacco: Never Used  . Alcohol Use: No  . Drug Use: No  . Sexual Activity: Not on file   Other Topics Concern  . Not on file   Social History Narrative    Review of Systems: See HPI, otherwise negative ROS  Physical Exam: BP 129/60 mmHg  Pulse 58  Temp(Src)  96.9 F (36.1 C) (Tympanic)  Resp 17  Ht  (1.575 m)  Wt 61.236 kg (135 lb)  BMI 24.69 kg/m2  SpO2 100% General:   Alert,  pleasant and cooperative in NAD Head:  Normocephalic and atraumatic. Neck:  Supple; no masses or thyromegaly. Lungs:  Clear throughout to auscultation.    Heart:  Regular rate and rhythm. Abdomen:  Soft, nontender and nondistended. Normal bowel sounds, without guarding, and without rebound.   Neurologic:  Alert and  oriented x4;  grossly normal neurologically.  Impression/Plan: Carrie [name] is here for an colonoscopy to be performed for screening.  Risks, benefits, limitations, and alternatives regarding colonoscopy  have been reviewed with the patient.  Questions have been answered.  All parties agreeable.   Jeno Calleros, Ezzard Standing, MD  06/04/2015, 8:34 AM

## 2015-09-29 ENCOUNTER — Other Ambulatory Visit: Payer: Self-pay | Admitting: Internal Medicine

## 2015-09-29 DIAGNOSIS — Z1231 Encounter for screening mammogram for malignant neoplasm of breast: Secondary | ICD-10-CM

## 2015-10-20 ENCOUNTER — Ambulatory Visit
Admission: RE | Admit: 2015-10-20 | Discharge: 2015-10-20 | Disposition: A | Discharge status: Home / Self Care | Payer: Medicare Other | Source: Ambulatory Visit | Attending: Internal Medicine | Admitting: Internal Medicine

## 2015-10-20 ENCOUNTER — Other Ambulatory Visit: Payer: Self-pay | Admitting: Internal Medicine

## 2015-10-20 ENCOUNTER — Ambulatory Visit: Payer: Medicare Other

## 2015-10-20 DIAGNOSIS — Z1231 Encounter for screening mammogram for malignant neoplasm of breast: Secondary | ICD-10-CM | POA: Diagnosis not present

## 2015-10-20 IMAGING — MG MM DIGITAL SCREENING BILAT W/ TOMO W/ CAD
8 of 12 series · 8 of 28 positions shown · non-contrast
Comparison: Previous exam(s).

CLINICAL DATA: Screening.

EXAM:
DIGITAL SCREENING BILATERAL MAMMOGRAM WITH 3D TOMO WITH CAD

[L CC]
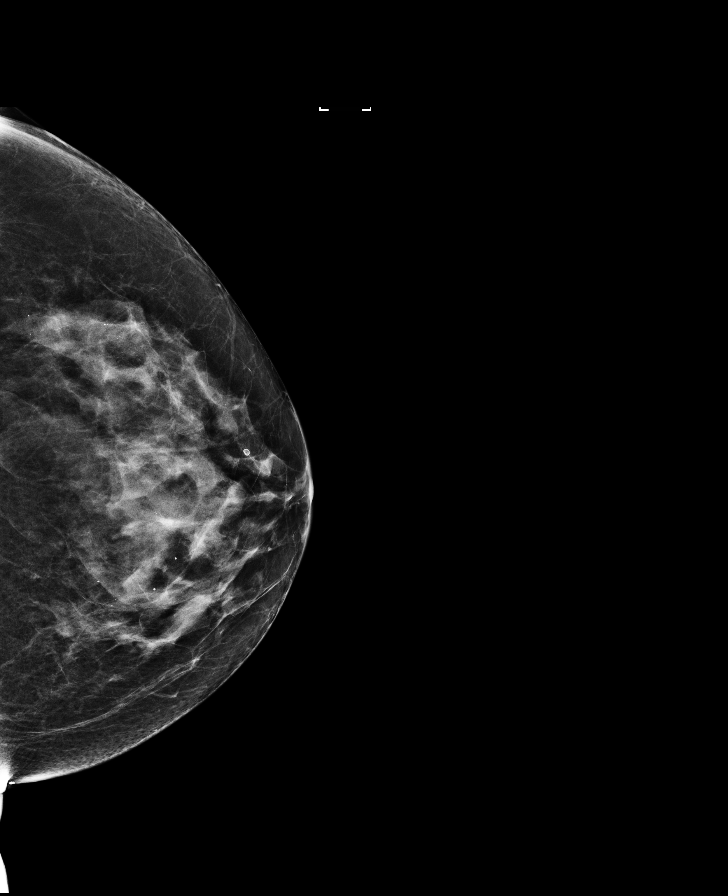

[L MLO synth-2D]
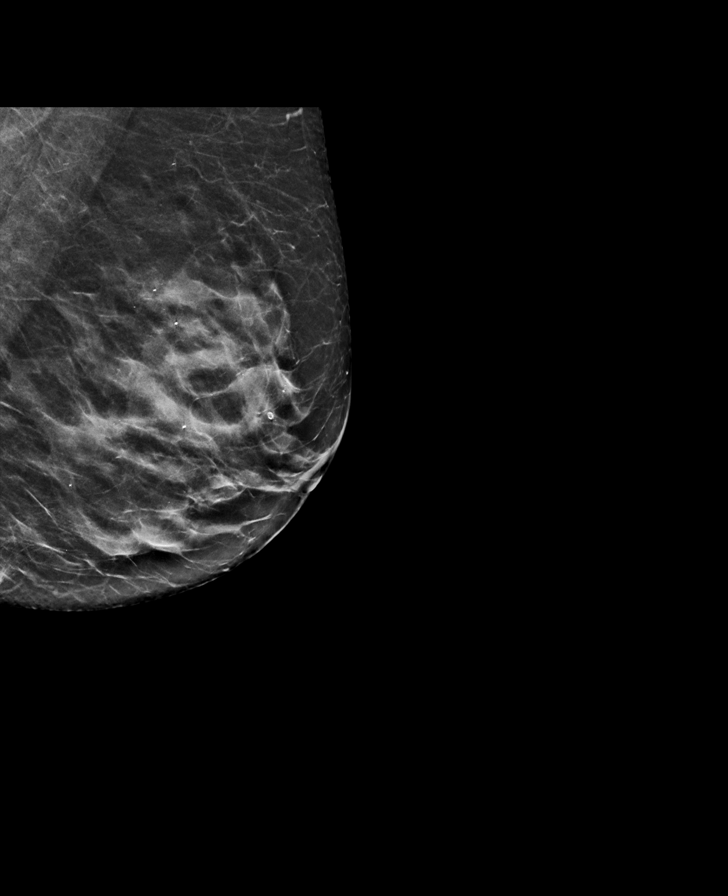

[L CC synth-2D]
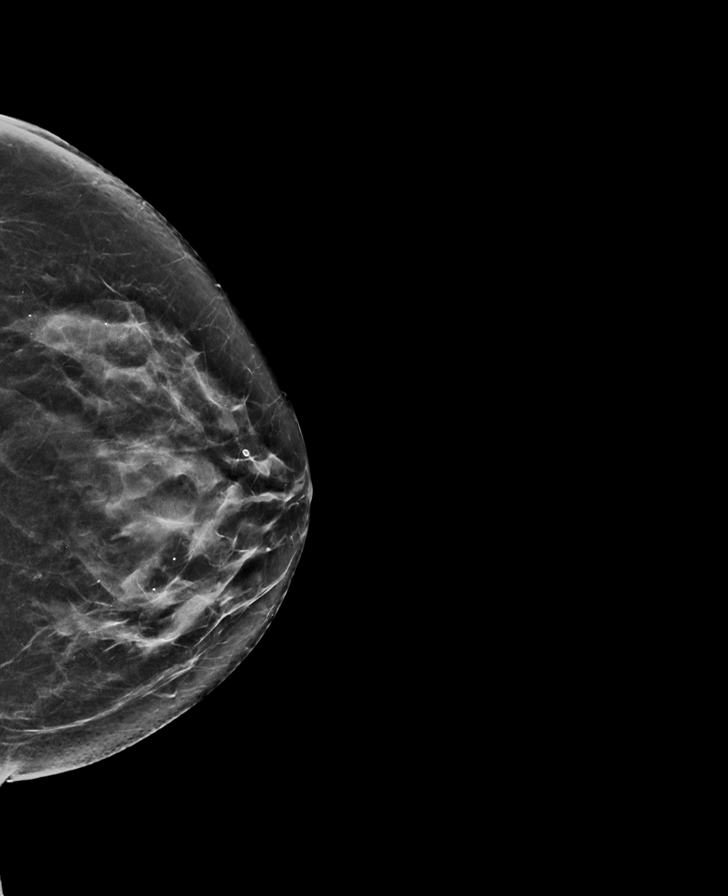

[R CC]
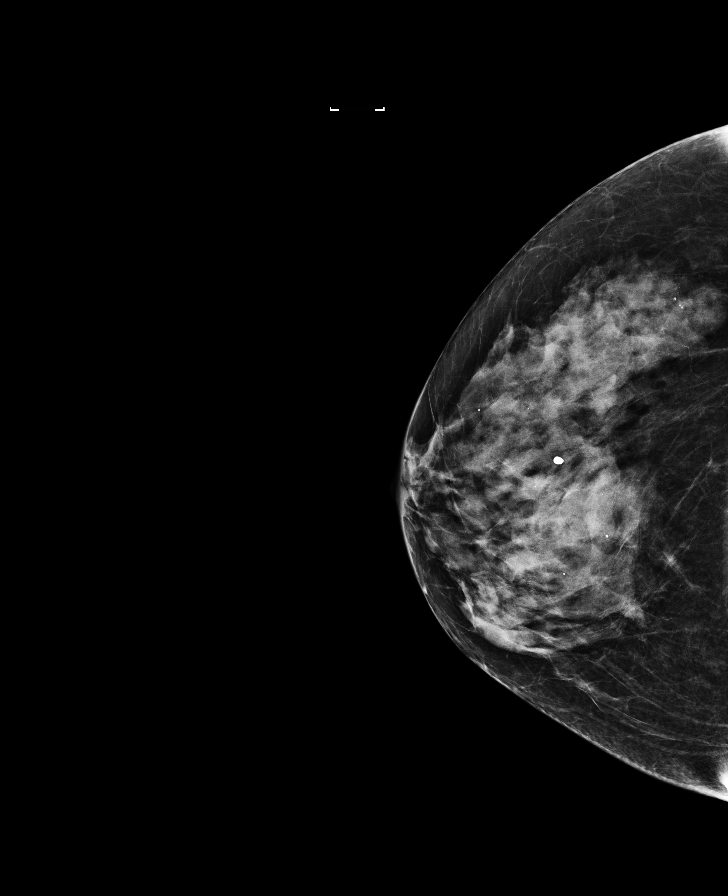

[R MLO synth-2D]
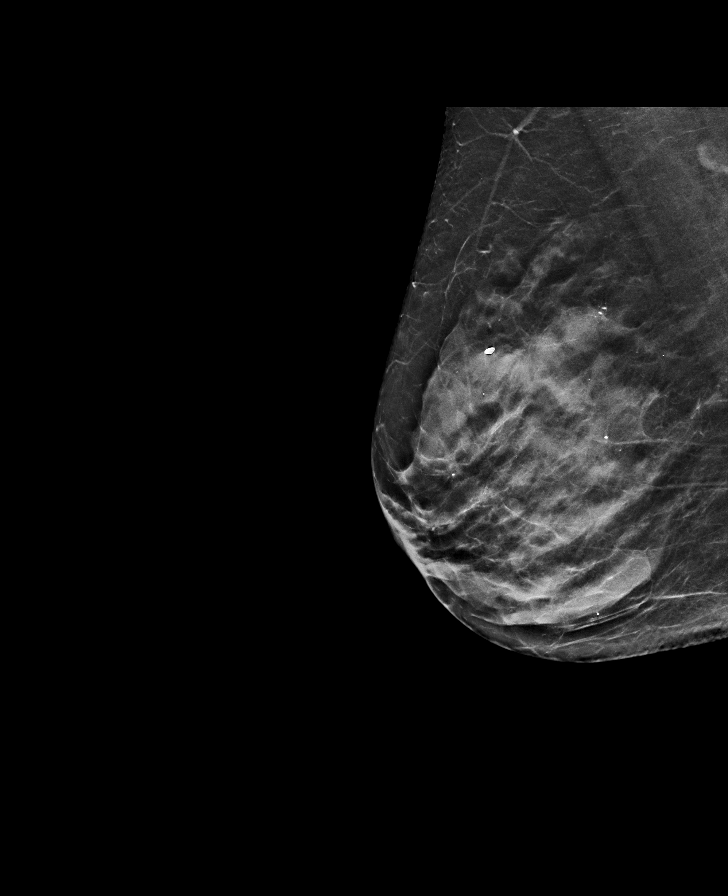

[R MLO]
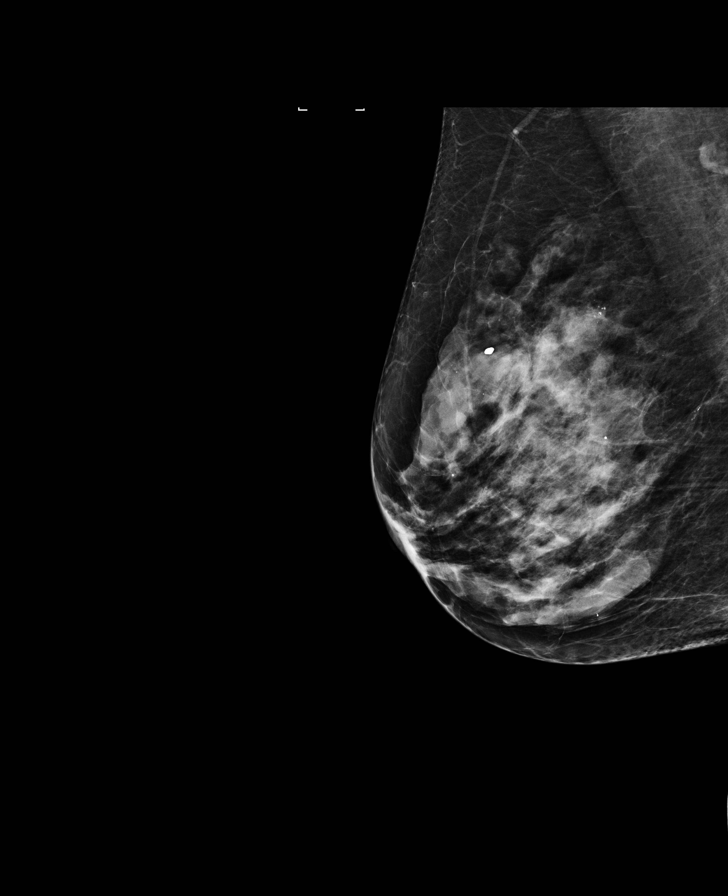

[R CC synth-2D]
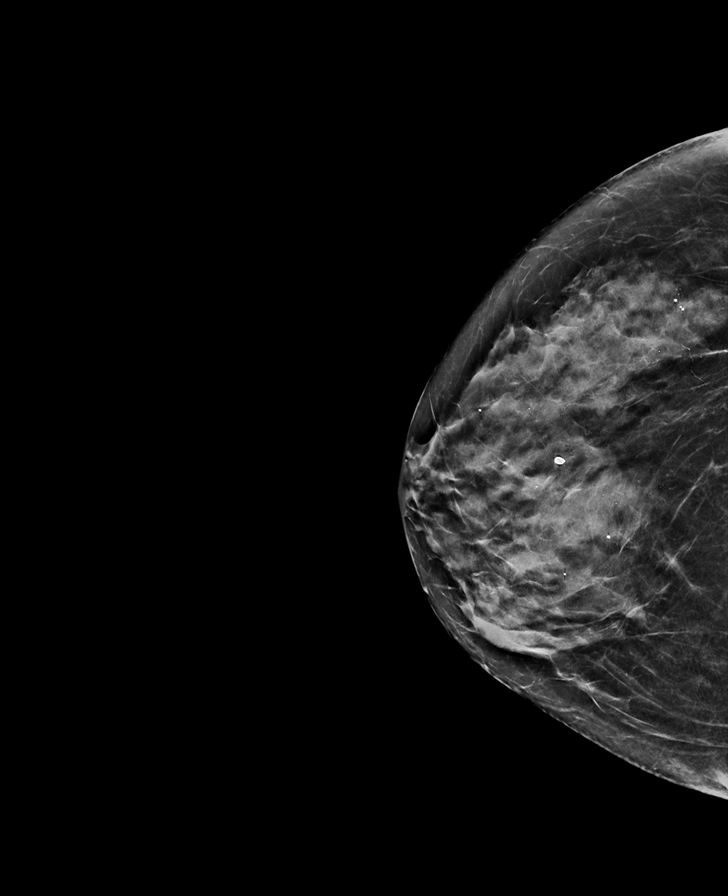

[L MLO]
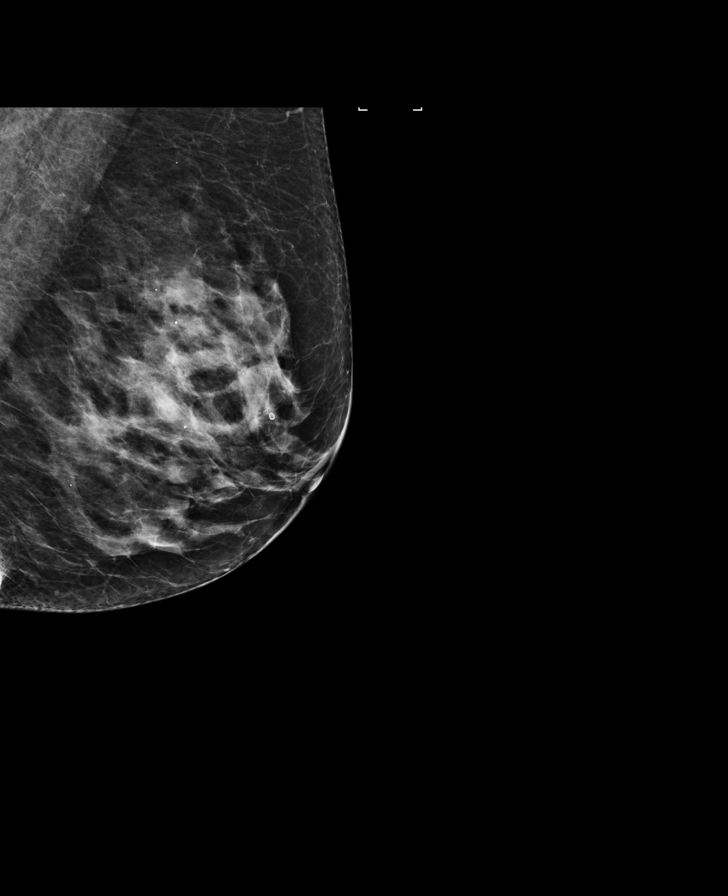

[8 of 28 positions shown; findings below may reference images not displayed]

ACR Breast Density Category c: The breast tissue is heterogeneously
dense, which may obscure small masses.
FINDINGS: There are no findings suspicious for malignancy. Images were
processed with CAD.
IMPRESSION: No mammographic evidence of malignancy. A result letter of this
screening mammogram will be mailed directly to the patient.

RECOMMENDATION:
Screening mammogram in one year. (Code:[SM])

BI-RADS CATEGORY  1: Negative.

## 2016-01-23 DIAGNOSIS — J208 Acute bronchitis due to other specified organisms: Secondary | ICD-10-CM | POA: Diagnosis not present

## 2016-03-24 DIAGNOSIS — M159 Polyosteoarthritis, unspecified: Secondary | ICD-10-CM | POA: Diagnosis not present

## 2016-03-24 DIAGNOSIS — I10 Essential (primary) hypertension: Secondary | ICD-10-CM | POA: Diagnosis not present

## 2016-03-24 DIAGNOSIS — E7800 Pure hypercholesterolemia, unspecified: Secondary | ICD-10-CM | POA: Diagnosis not present

## 2016-03-31 DIAGNOSIS — R05 Cough: Secondary | ICD-10-CM | POA: Diagnosis not present

## 2016-03-31 DIAGNOSIS — E7800 Pure hypercholesterolemia, unspecified: Secondary | ICD-10-CM | POA: Diagnosis not present

## 2016-03-31 DIAGNOSIS — E079 Disorder of thyroid, unspecified: Secondary | ICD-10-CM | POA: Diagnosis not present

## 2016-03-31 DIAGNOSIS — I10 Essential (primary) hypertension: Secondary | ICD-10-CM | POA: Diagnosis not present

## 2016-03-31 DIAGNOSIS — M159 Polyosteoarthritis, unspecified: Secondary | ICD-10-CM | POA: Diagnosis not present

## 2016-04-16 DIAGNOSIS — J358 Other chronic diseases of tonsils and adenoids: Secondary | ICD-10-CM | POA: Diagnosis not present

## 2016-04-16 DIAGNOSIS — R490 Dysphonia: Secondary | ICD-10-CM | POA: Diagnosis not present

## 2016-04-16 DIAGNOSIS — H6123 Impacted cerumen, bilateral: Secondary | ICD-10-CM | POA: Diagnosis not present

## 2016-04-16 DIAGNOSIS — J301 Allergic rhinitis due to pollen: Secondary | ICD-10-CM | POA: Diagnosis not present

## 2016-04-16 DIAGNOSIS — K219 Gastro-esophageal reflux disease without esophagitis: Secondary | ICD-10-CM | POA: Diagnosis not present

## 2016-05-28 DIAGNOSIS — R490 Dysphonia: Secondary | ICD-10-CM | POA: Diagnosis not present

## 2016-05-28 DIAGNOSIS — K219 Gastro-esophageal reflux disease without esophagitis: Secondary | ICD-10-CM | POA: Diagnosis not present

## 2016-07-07 ENCOUNTER — Ambulatory Visit: Payer: PPO | Admitting: Speech Pathology

## 2016-07-09 ENCOUNTER — Ambulatory Visit: Payer: Medicare Other | Admitting: Speech Pathology

## 2016-07-14 ENCOUNTER — Ambulatory Visit: Payer: Medicare Other | Admitting: Speech Pathology

## 2016-07-16 ENCOUNTER — Ambulatory Visit: Payer: PPO | Admitting: Speech Pathology

## 2016-07-21 ENCOUNTER — Ambulatory Visit: Payer: Medicare Other | Admitting: Speech Pathology

## 2016-07-23 ENCOUNTER — Ambulatory Visit: Payer: Medicare Other | Admitting: Speech Pathology

## 2016-07-26 ENCOUNTER — Ambulatory Visit: Payer: Medicare Other | Admitting: Speech Pathology

## 2016-07-28 ENCOUNTER — Ambulatory Visit: Payer: Medicare Other | Admitting: Speech Pathology

## 2016-07-30 ENCOUNTER — Ambulatory Visit: Payer: Medicare Other | Admitting: Speech Pathology

## 2016-08-04 ENCOUNTER — Ambulatory Visit: Payer: Medicare Other | Admitting: Speech Pathology

## 2016-08-04 DIAGNOSIS — Z961 Presence of intraocular lens: Secondary | ICD-10-CM | POA: Diagnosis not present

## 2016-08-06 ENCOUNTER — Ambulatory Visit: Payer: Medicare Other | Admitting: Speech Pathology

## 2016-09-21 DIAGNOSIS — M159 Polyosteoarthritis, unspecified: Secondary | ICD-10-CM | POA: Diagnosis not present

## 2016-09-21 DIAGNOSIS — I10 Essential (primary) hypertension: Secondary | ICD-10-CM | POA: Diagnosis not present

## 2016-09-29 ENCOUNTER — Other Ambulatory Visit: Payer: Self-pay | Admitting: Internal Medicine

## 2016-09-29 DIAGNOSIS — I10 Essential (primary) hypertension: Secondary | ICD-10-CM | POA: Diagnosis not present

## 2016-09-29 DIAGNOSIS — E079 Disorder of thyroid, unspecified: Secondary | ICD-10-CM | POA: Diagnosis not present

## 2016-09-29 DIAGNOSIS — Z1231 Encounter for screening mammogram for malignant neoplasm of breast: Secondary | ICD-10-CM

## 2016-09-29 DIAGNOSIS — M159 Polyosteoarthritis, unspecified: Secondary | ICD-10-CM | POA: Diagnosis not present

## 2016-09-29 DIAGNOSIS — E7800 Pure hypercholesterolemia, unspecified: Secondary | ICD-10-CM | POA: Diagnosis not present

## 2016-09-29 DIAGNOSIS — Z0000 Encounter for general adult medical examination without abnormal findings: Secondary | ICD-10-CM | POA: Diagnosis not present

## 2016-10-19 DIAGNOSIS — E7800 Pure hypercholesterolemia, unspecified: Secondary | ICD-10-CM | POA: Diagnosis not present

## 2016-11-06 IMAGING — US US BREAST*R* LIMITED INC AXILLA
1 series · 5 of 5 positions shown · non-contrast
Comparison: Previous exam(s).

CLINICAL DATA: 71-year-old female with right breast lump for
several weeks. The patient has difficulty locating the exact
location of the lump today. She reports a small bruise in this area
but does not recall any trauma.

EXAM:
2D DIGITAL DIAGNOSTIC RIGHT MAMMOGRAM WITH CAD AND ADJUNCT TOMO
ULTRASOUND RIGHT BREAST

[Series 1: us breast*right* limited inc axilla · 0.07mm/px · 5 of 5 slices shown]
[im 1/5]
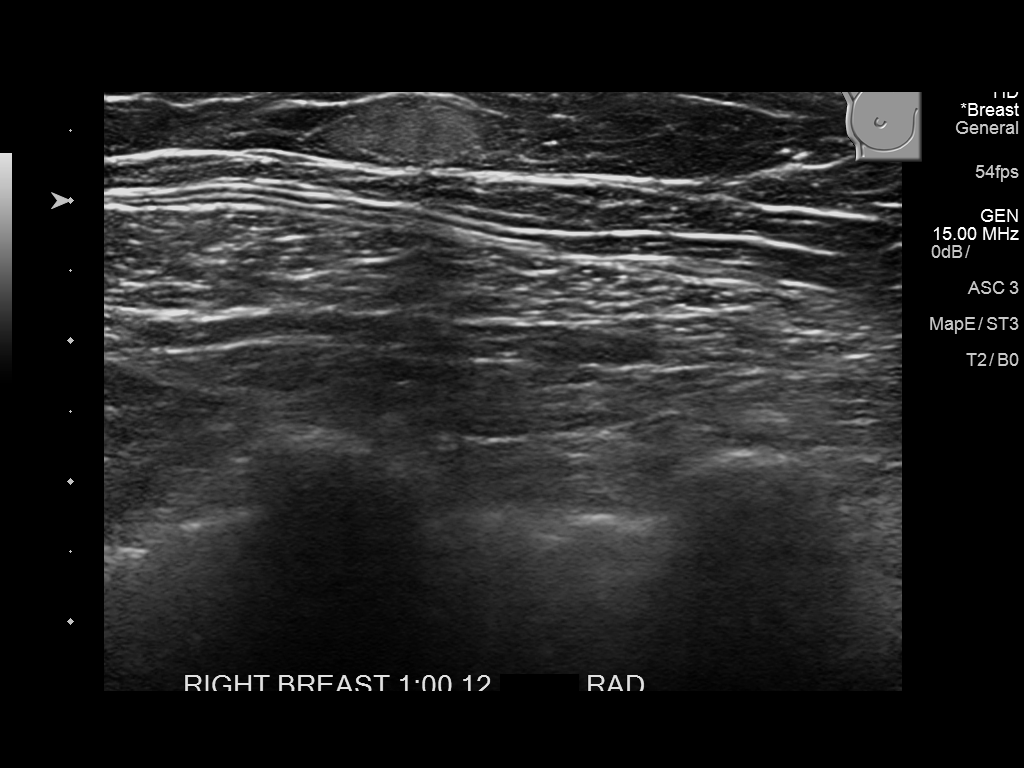
[im 2/5]
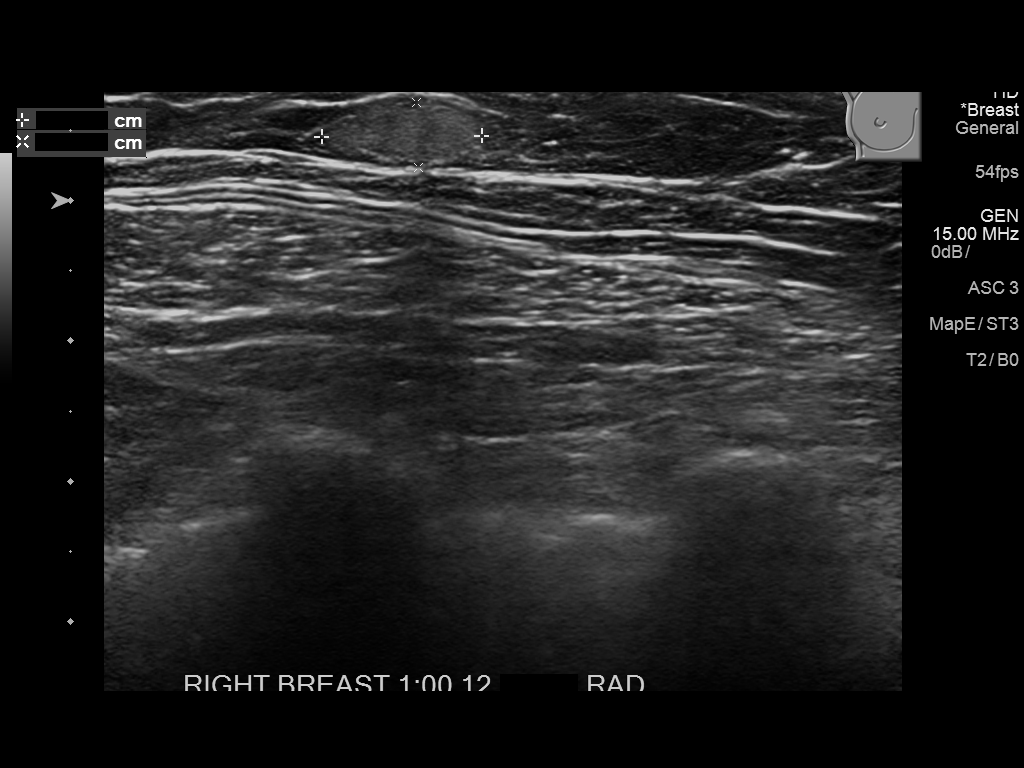
[im 3/5]
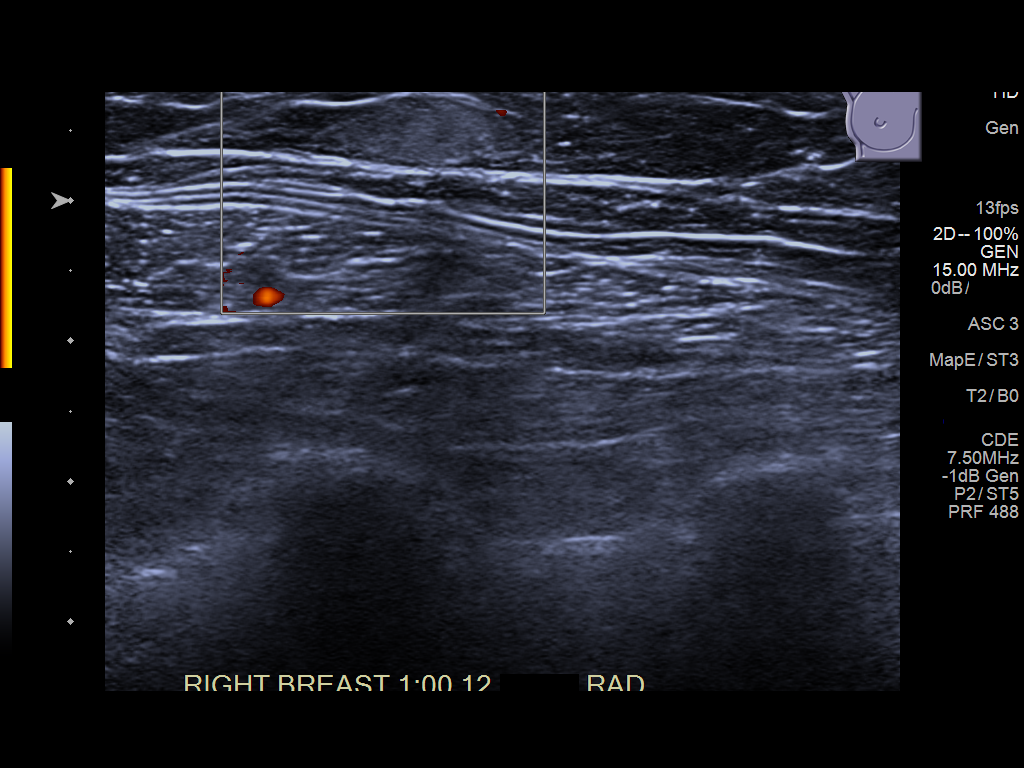
[im 4/5]
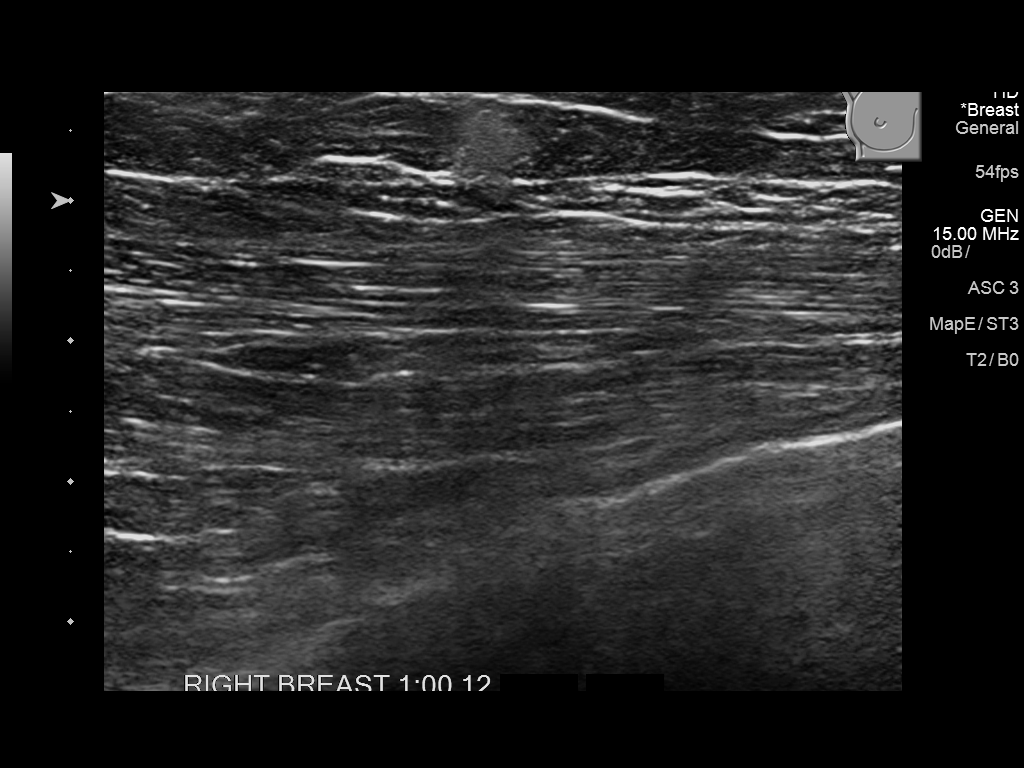
[im 5/5]
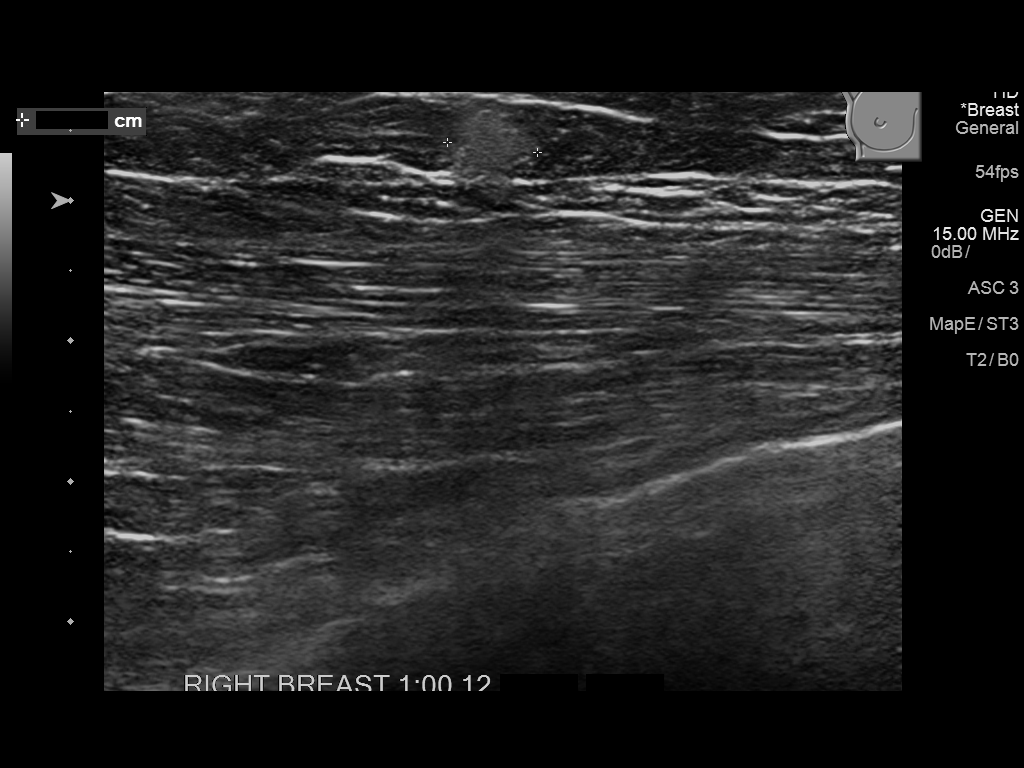

[5 of 5 positions shown; findings below may reference images not displayed]

ACR Breast Density Category c: The breast tissue is heterogeneously
dense, which may obscure small masses.
FINDINGS: There is a new, fat containing asymmetry within the posterior,
superior right breast, only visualized on the MLO and spot
tangential views. This asymmetry is in the region of the palpable
marker.

Mammographic images were processed with CAD.

On physical exam, a smooth, subcentimeter lump is felt in the
patient's area of concern at 1 o'clock, 12 cm from the nipple. No
bruising is noted at the site of the palpable abnormality; however,
a small area of bruising is noted just inferior to the area of
concern in the region of prior excisional biopsy scar. The patient
reports that she did previously have bruising in the area of
concern.

Targeted ultrasound of the right breast was performed demonstrating
an oval, predominantly hyperechoic mass at 1 o'clock, 12 cm from the
nipple measuring 11 x 5 x 6 mm, corresponding to the mammographic
finding. Findings are thought to likely reflect changes of fat
necrosis.
IMPRESSION: Probably benign right breast finding.

RECOMMENDATION:
Options including short-term follow-up versus ultrasound-guided
biopsy were discussed with the patient. The patient wishes to pursue
short-term follow-up at this time. A right diagnostic mammogram and
possible ultrasound are recommended in 3 months.

I have discussed the findings and recommendations with the patient.
Results were also provided in writing at the conclusion of the
visit. If applicable, a reminder letter will be sent to the patient
regarding the next appointment.

BI-RADS CATEGORY  3: Probably benign.

## 2016-11-10 ENCOUNTER — Ambulatory Visit: Payer: PPO

## 2016-11-17 ENCOUNTER — Ambulatory Visit: Payer: PPO

## 2016-12-10 ENCOUNTER — Ambulatory Visit: Payer: PPO

## 2017-01-03 ENCOUNTER — Ambulatory Visit
Admission: RE | Admit: 2017-01-03 | Discharge: 2017-01-03 | Disposition: A | Discharge status: Home / Self Care | Payer: PPO | Source: Ambulatory Visit | Attending: Internal Medicine | Admitting: Internal Medicine

## 2017-01-03 DIAGNOSIS — Z1231 Encounter for screening mammogram for malignant neoplasm of breast: Secondary | ICD-10-CM | POA: Diagnosis not present

## 2017-01-03 IMAGING — MG MM DIGITAL SCREENING BILAT W/ CAD
4 series · 4 of 4 positions shown · non-contrast
Comparison: Previous exam(s).

CLINICAL DATA: Screening.

EXAM:
DIGITAL SCREENING BILATERAL MAMMOGRAM WITH CAD

[L MLO]
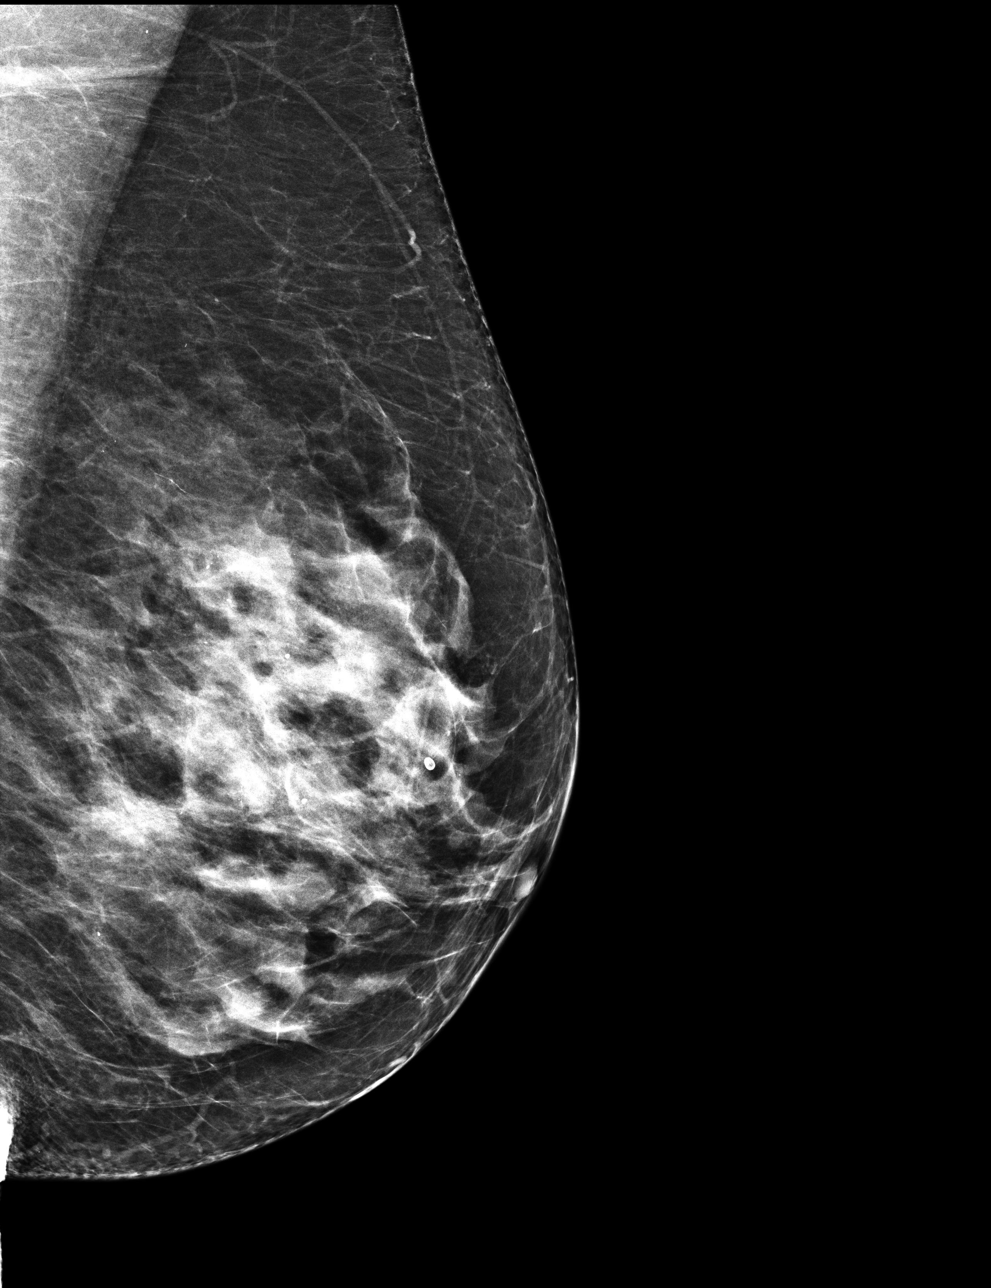

[R CC]
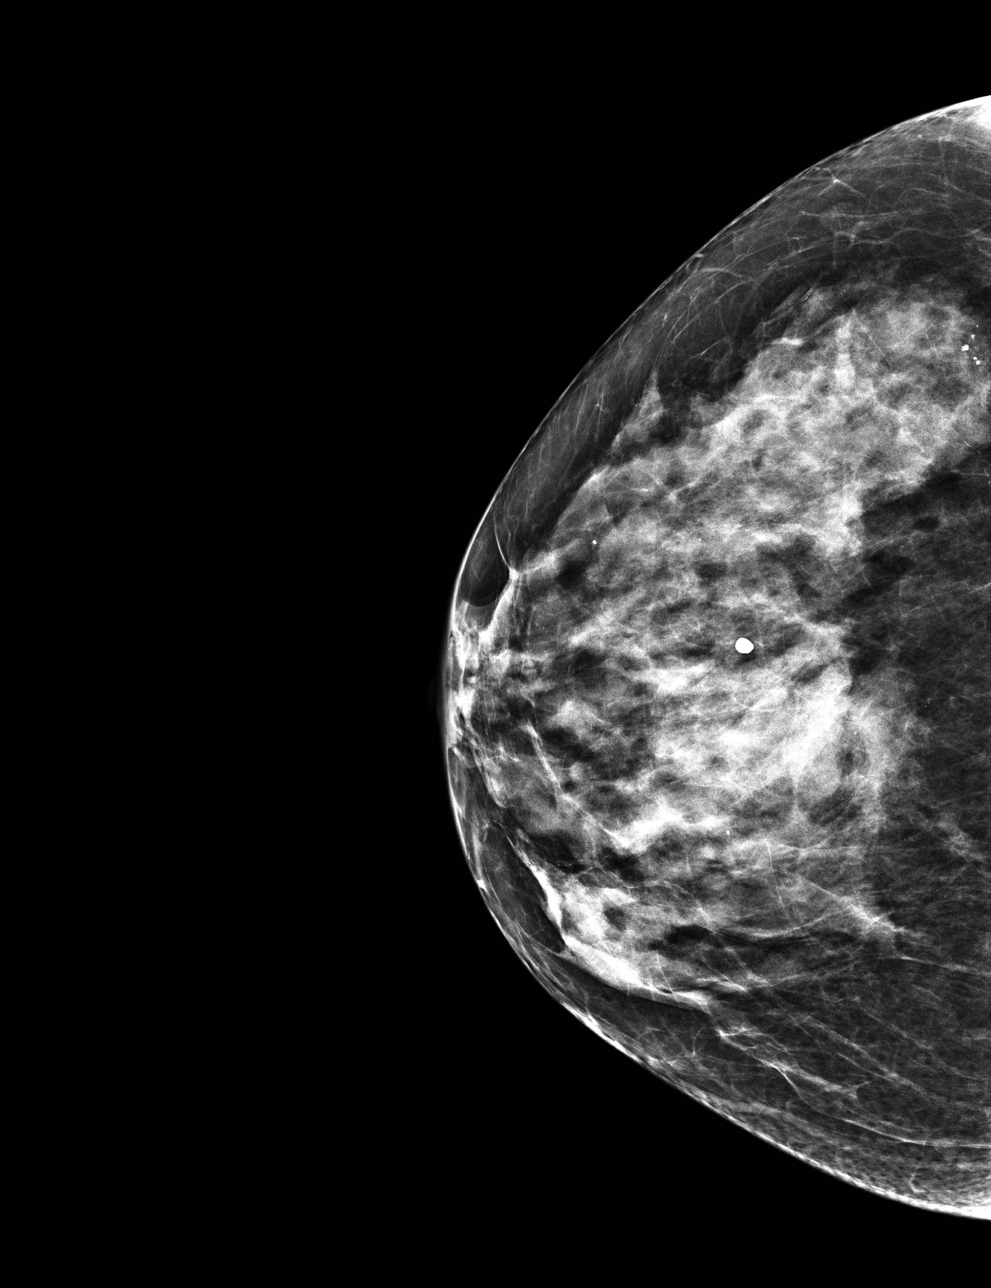

[R MLO]
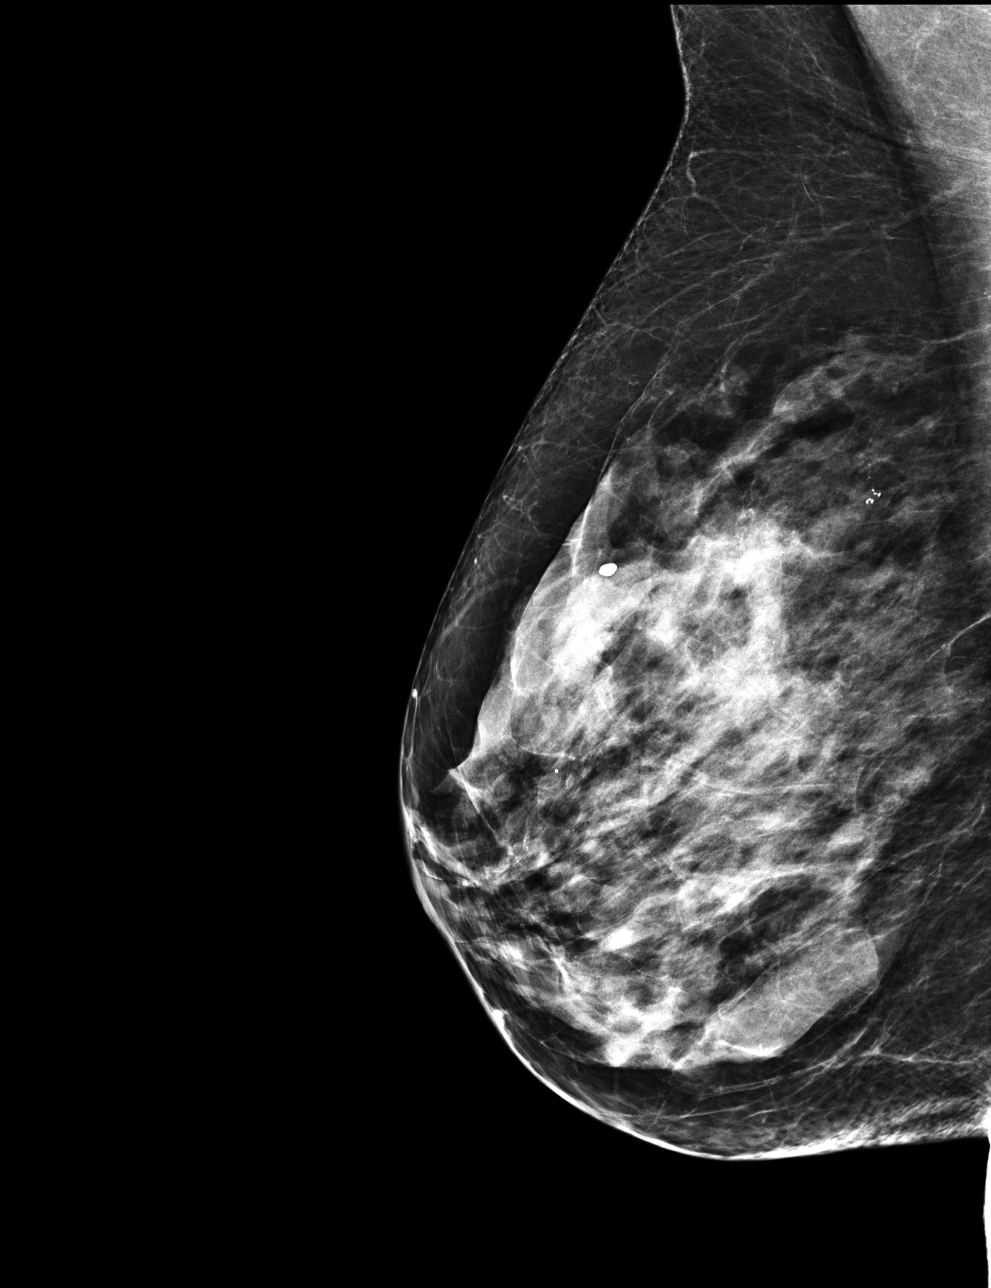

[L CC]
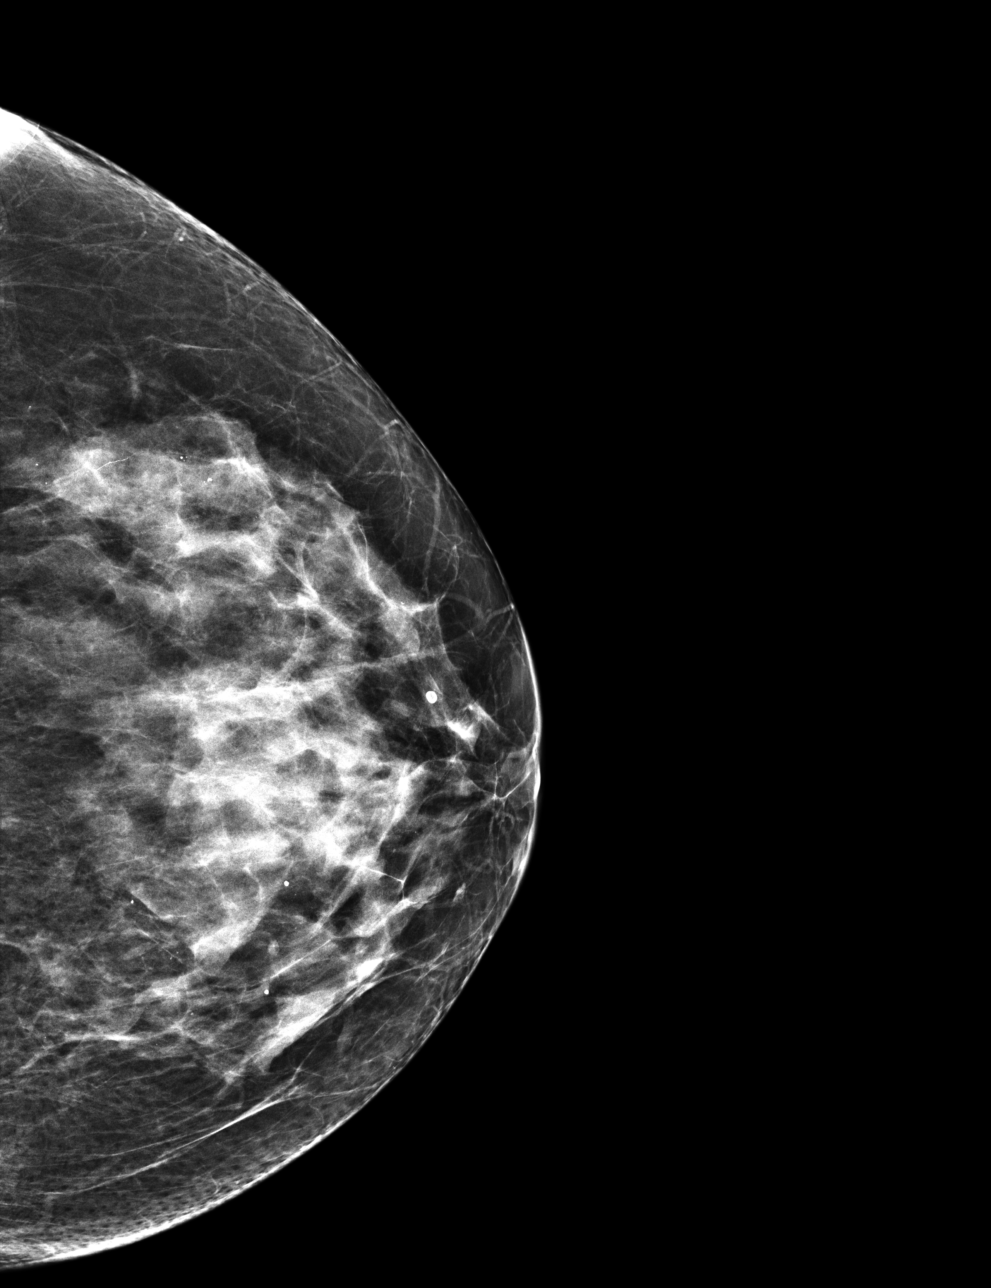

[4 of 4 positions shown; findings below may reference images not displayed]

ACR Breast Density Category d: The breast tissue is extremely dense,
which lowers the sensitivity of mammography.
FINDINGS: There are no findings suspicious for malignancy. Images were
processed with CAD.
IMPRESSION: No mammographic evidence of malignancy. A result letter of this
screening mammogram will be mailed directly to the patient.

RECOMMENDATION:
Screening mammogram in one year. (Code:[ZH])

BI-RADS CATEGORY  1: Negative.

## 2017-03-14 DIAGNOSIS — N63 Unspecified lump in unspecified breast: Secondary | ICD-10-CM | POA: Diagnosis not present

## 2017-03-15 ENCOUNTER — Other Ambulatory Visit: Payer: Self-pay | Admitting: Internal Medicine

## 2017-03-15 DIAGNOSIS — N63 Unspecified lump in unspecified breast: Secondary | ICD-10-CM

## 2017-03-23 ENCOUNTER — Ambulatory Visit
Admission: RE | Admit: 2017-03-23 | Discharge: 2017-03-23 | Disposition: A | Discharge status: Home / Self Care | Payer: PPO | Source: Ambulatory Visit | Attending: Internal Medicine | Admitting: Internal Medicine

## 2017-03-23 DIAGNOSIS — N6321 Unspecified lump in the left breast, upper outer quadrant: Secondary | ICD-10-CM | POA: Diagnosis not present

## 2017-03-23 DIAGNOSIS — M159 Polyosteoarthritis, unspecified: Secondary | ICD-10-CM | POA: Diagnosis not present

## 2017-03-23 DIAGNOSIS — N6310 Unspecified lump in the right breast, unspecified quadrant: Secondary | ICD-10-CM | POA: Diagnosis not present

## 2017-03-23 DIAGNOSIS — E079 Disorder of thyroid, unspecified: Secondary | ICD-10-CM | POA: Diagnosis not present

## 2017-03-23 DIAGNOSIS — E7800 Pure hypercholesterolemia, unspecified: Secondary | ICD-10-CM | POA: Diagnosis not present

## 2017-03-23 DIAGNOSIS — R922 Inconclusive mammogram: Secondary | ICD-10-CM | POA: Diagnosis not present

## 2017-03-23 DIAGNOSIS — I10 Essential (primary) hypertension: Secondary | ICD-10-CM | POA: Diagnosis not present

## 2017-03-23 DIAGNOSIS — N63 Unspecified lump in unspecified breast: Secondary | ICD-10-CM

## 2017-03-23 IMAGING — MG MM DIGITAL DIAGNOSTIC UNILAT*R* W/ TOMO W/ CAD
8 of 11 series · 8 of 23 positions shown · non-contrast
Comparison: Previous exam(s).

CLINICAL DATA: 71-year-old female with right breast lump for
several weeks. The patient has difficulty locating the exact
location of the lump today. She reports a small bruise in this area
but does not recall any trauma.

EXAM:
2D DIGITAL DIAGNOSTIC RIGHT MAMMOGRAM WITH CAD AND ADJUNCT TOMO
ULTRASOUND RIGHT BREAST

[R SIO synth-2D]
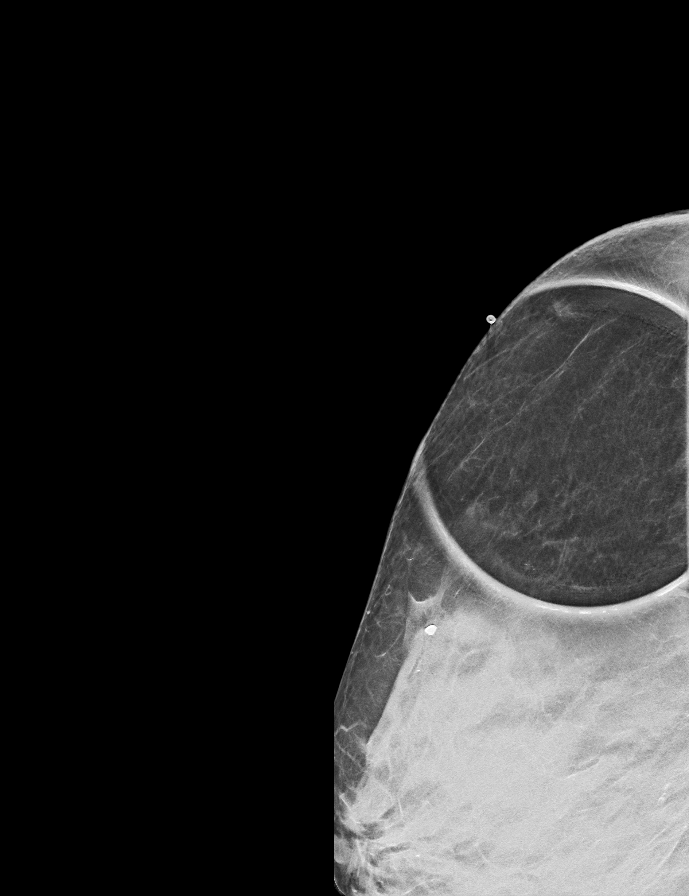

[R CC synth-2D]
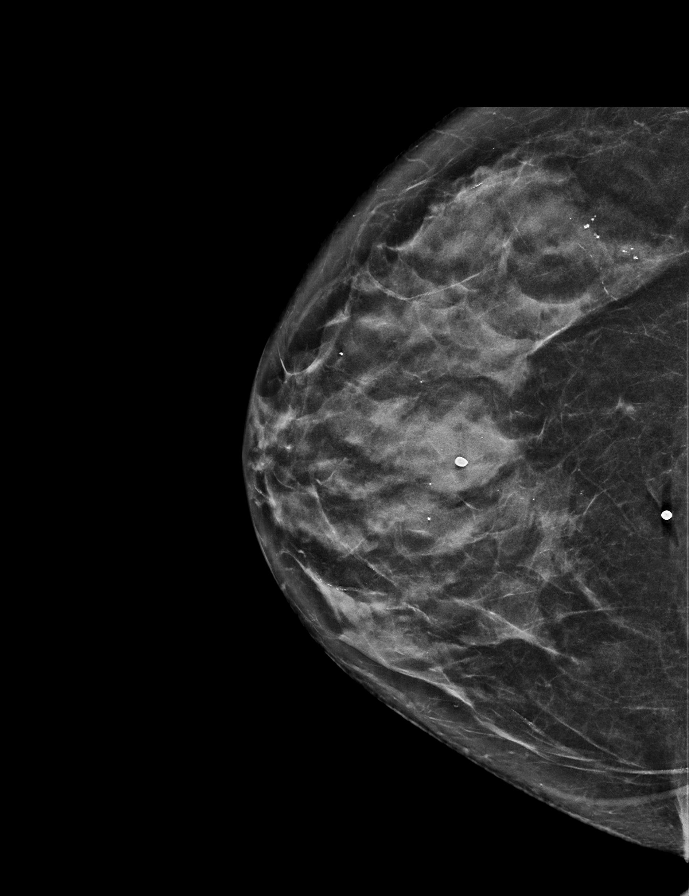

[R MLO (1 of 2)]
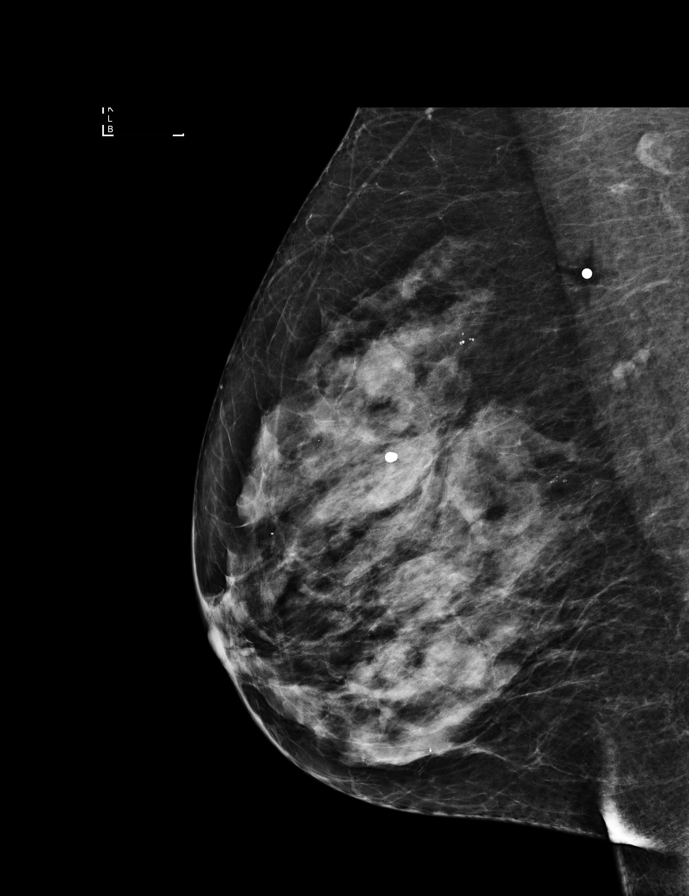

[R SIO (1 of 2)]
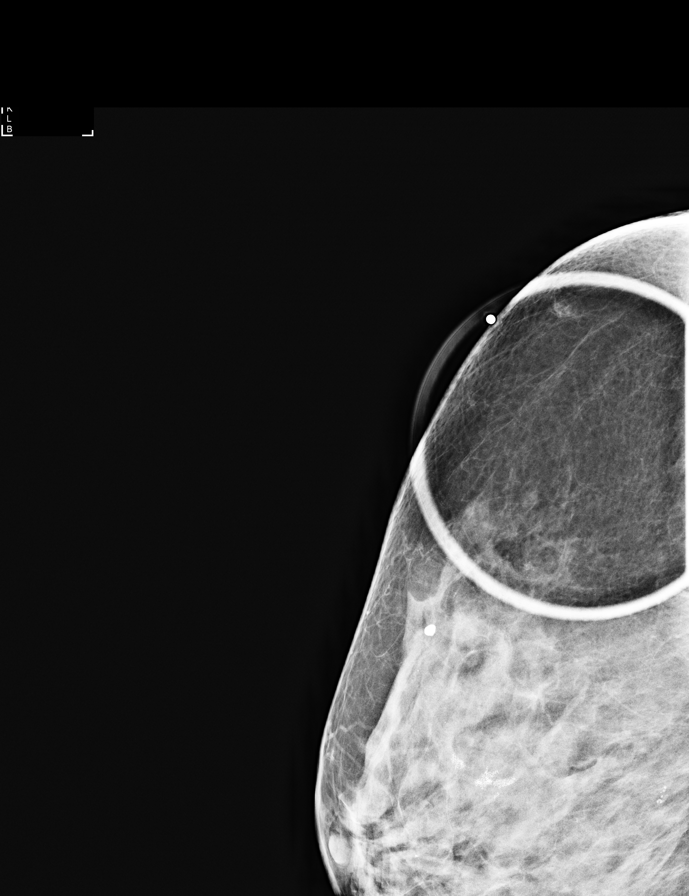

[R MLO synth-2D]
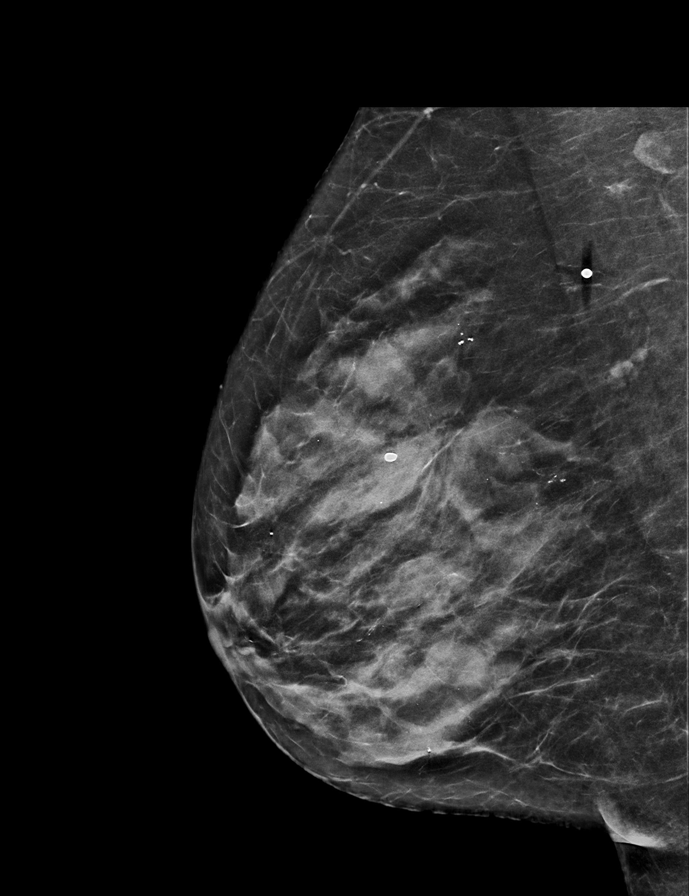

[R CC]
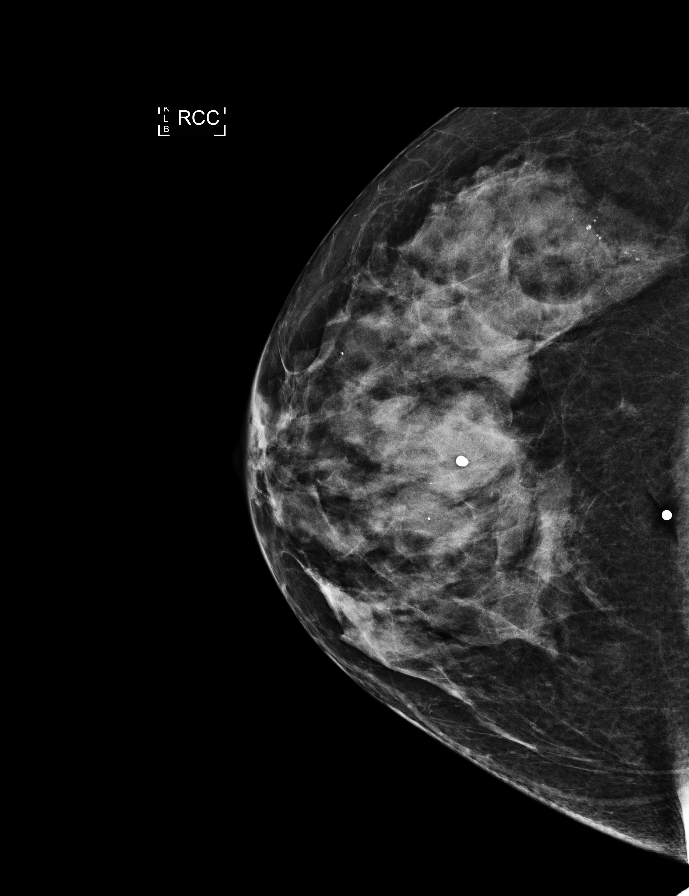

[R SIO (2 of 2)]
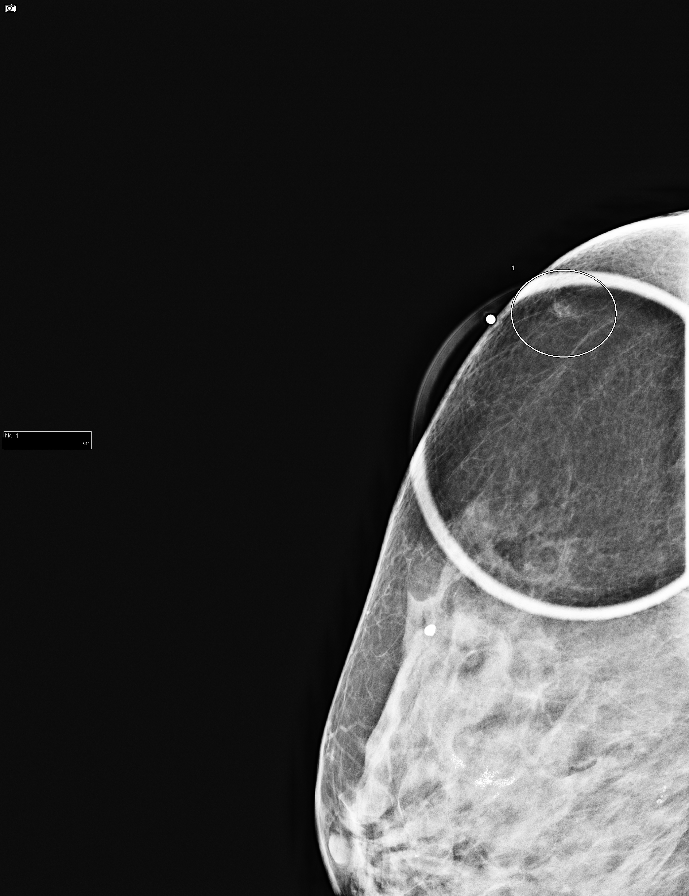

[R MLO (2 of 2)]
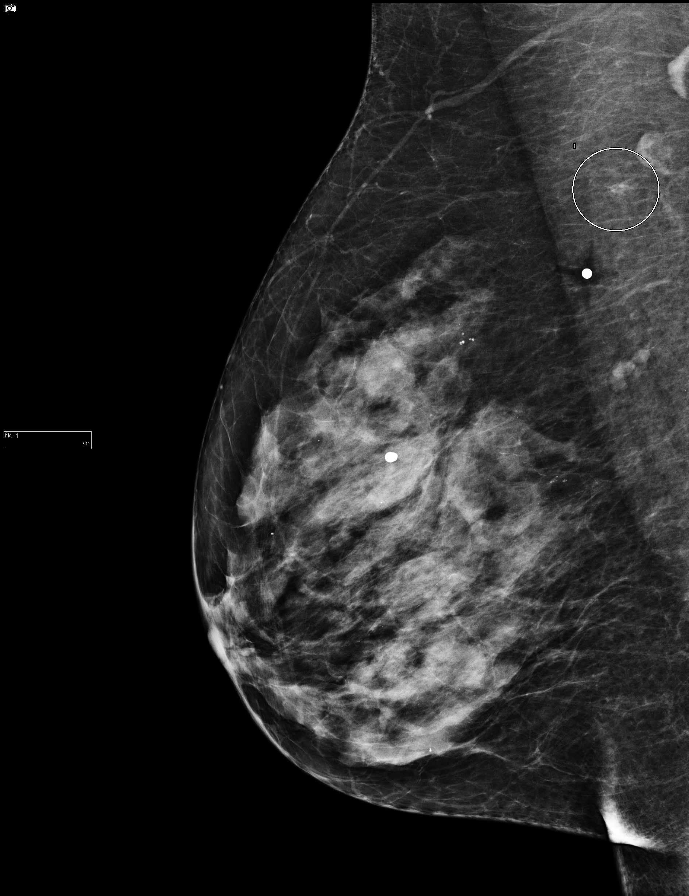

[8 of 23 positions shown; findings below may reference images not displayed]

ACR Breast Density Category c: The breast tissue is heterogeneously
dense, which may obscure small masses.
FINDINGS: There is a new, fat containing asymmetry within the posterior,
superior right breast, only visualized on the MLO and spot
tangential views. This asymmetry is in the region of the palpable
marker.

Mammographic images were processed with CAD.

On physical exam, a smooth, subcentimeter lump is felt in the
patient's area of concern at 1 o'clock, 12 cm from the nipple. No
bruising is noted at the site of the palpable abnormality; however,
a small area of bruising is noted just inferior to the area of
concern in the region of prior excisional biopsy scar. The patient
reports that she did previously have bruising in the area of
concern.

Targeted ultrasound of the right breast was performed demonstrating
an oval, predominantly hyperechoic mass at 1 o'clock, 12 cm from the
nipple measuring 11 x 5 x 6 mm, corresponding to the mammographic
finding. Findings are thought to likely reflect changes of fat
necrosis.
IMPRESSION: Probably benign right breast finding.

RECOMMENDATION:
Options including short-term follow-up versus ultrasound-guided
biopsy were discussed with the patient. The patient wishes to pursue
short-term follow-up at this time. A right diagnostic mammogram and
possible ultrasound are recommended in 3 months.

I have discussed the findings and recommendations with the patient.
Results were also provided in writing at the conclusion of the
visit. If applicable, a reminder letter will be sent to the patient
regarding the next appointment.

BI-RADS CATEGORY  3: Probably benign.

## 2017-03-30 DIAGNOSIS — M159 Polyosteoarthritis, unspecified: Secondary | ICD-10-CM | POA: Diagnosis not present

## 2017-03-30 DIAGNOSIS — I10 Essential (primary) hypertension: Secondary | ICD-10-CM | POA: Diagnosis not present

## 2017-03-30 DIAGNOSIS — E7800 Pure hypercholesterolemia, unspecified: Secondary | ICD-10-CM | POA: Diagnosis not present

## 2017-03-30 DIAGNOSIS — E079 Disorder of thyroid, unspecified: Secondary | ICD-10-CM | POA: Diagnosis not present

## 2017-10-05 DIAGNOSIS — E7800 Pure hypercholesterolemia, unspecified: Secondary | ICD-10-CM | POA: Diagnosis not present

## 2017-10-05 DIAGNOSIS — M159 Polyosteoarthritis, unspecified: Secondary | ICD-10-CM | POA: Diagnosis not present

## 2017-10-05 DIAGNOSIS — E079 Disorder of thyroid, unspecified: Secondary | ICD-10-CM | POA: Diagnosis not present

## 2017-10-05 DIAGNOSIS — I10 Essential (primary) hypertension: Secondary | ICD-10-CM | POA: Diagnosis not present

## 2017-10-07 DIAGNOSIS — H9221 Otorrhagia, right ear: Secondary | ICD-10-CM | POA: Diagnosis not present

## 2017-10-12 DIAGNOSIS — I10 Essential (primary) hypertension: Secondary | ICD-10-CM | POA: Diagnosis not present

## 2017-10-12 DIAGNOSIS — E7800 Pure hypercholesterolemia, unspecified: Secondary | ICD-10-CM | POA: Diagnosis not present

## 2017-10-12 DIAGNOSIS — E079 Disorder of thyroid, unspecified: Secondary | ICD-10-CM | POA: Diagnosis not present

## 2017-10-12 DIAGNOSIS — R202 Paresthesia of skin: Secondary | ICD-10-CM | POA: Diagnosis not present

## 2017-10-12 DIAGNOSIS — Z0000 Encounter for general adult medical examination without abnormal findings: Secondary | ICD-10-CM | POA: Diagnosis not present

## 2018-04-12 DIAGNOSIS — E7800 Pure hypercholesterolemia, unspecified: Secondary | ICD-10-CM | POA: Diagnosis not present

## 2018-04-12 DIAGNOSIS — R202 Paresthesia of skin: Secondary | ICD-10-CM | POA: Diagnosis not present

## 2018-04-12 DIAGNOSIS — I10 Essential (primary) hypertension: Secondary | ICD-10-CM | POA: Diagnosis not present

## 2018-05-03 DIAGNOSIS — M25562 Pain in left knee: Secondary | ICD-10-CM | POA: Diagnosis not present

## 2018-05-03 DIAGNOSIS — E538 Deficiency of other specified B group vitamins: Secondary | ICD-10-CM | POA: Insufficient documentation

## 2018-05-03 DIAGNOSIS — I10 Essential (primary) hypertension: Secondary | ICD-10-CM | POA: Diagnosis not present

## 2018-05-03 DIAGNOSIS — E7800 Pure hypercholesterolemia, unspecified: Secondary | ICD-10-CM | POA: Diagnosis not present

## 2018-05-03 DIAGNOSIS — G8929 Other chronic pain: Secondary | ICD-10-CM | POA: Diagnosis not present

## 2018-05-03 DIAGNOSIS — M1712 Unilateral primary osteoarthritis, left knee: Secondary | ICD-10-CM | POA: Diagnosis not present

## 2018-05-03 DIAGNOSIS — M79672 Pain in left foot: Secondary | ICD-10-CM | POA: Diagnosis not present

## 2018-05-03 DIAGNOSIS — M159 Polyosteoarthritis, unspecified: Secondary | ICD-10-CM | POA: Diagnosis not present

## 2018-05-03 DIAGNOSIS — E079 Disorder of thyroid, unspecified: Secondary | ICD-10-CM | POA: Diagnosis not present

## 2018-05-23 ENCOUNTER — Other Ambulatory Visit: Payer: Self-pay | Admitting: Internal Medicine

## 2018-05-23 DIAGNOSIS — N63 Unspecified lump in unspecified breast: Secondary | ICD-10-CM

## 2018-05-23 DIAGNOSIS — M722 Plantar fascial fibromatosis: Secondary | ICD-10-CM | POA: Diagnosis not present

## 2018-06-19 ENCOUNTER — Ambulatory Visit
Admission: RE | Admit: 2018-06-19 | Discharge: 2018-06-19 | Disposition: A | Discharge status: Home / Self Care | Payer: PPO | Source: Ambulatory Visit | Attending: Internal Medicine | Admitting: Internal Medicine

## 2018-06-19 DIAGNOSIS — N63 Unspecified lump in unspecified breast: Secondary | ICD-10-CM

## 2018-06-19 DIAGNOSIS — N6489 Other specified disorders of breast: Secondary | ICD-10-CM | POA: Diagnosis not present

## 2018-06-19 DIAGNOSIS — R922 Inconclusive mammogram: Secondary | ICD-10-CM | POA: Diagnosis not present

## 2018-06-19 DIAGNOSIS — N6312 Unspecified lump in the right breast, upper inner quadrant: Secondary | ICD-10-CM | POA: Diagnosis not present

## 2018-06-19 IMAGING — MG MM DIGITAL DIAGNOSTIC BILAT W/ TOMO W/ CAD
6 of 10 series · 6 of 30 positions shown · non-contrast
Comparison: [DATE] and earlier

CLINICAL DATA: Delayed followup for palpable abnormality in the
RIGHT breast. By mammogram and ultrasound in [DATE], there was an
area of probable fat necrosis in the 1 o'clock location of the RIGHT
breast.The patient reports palpable abnormality has resolved.

EXAM:
DIGITAL DIAGNOSTIC BILATERAL MAMMOGRAM WITH CAD AND TOMO
ULTRASOUND RIGHT BREAST

[L CC synth-2D]
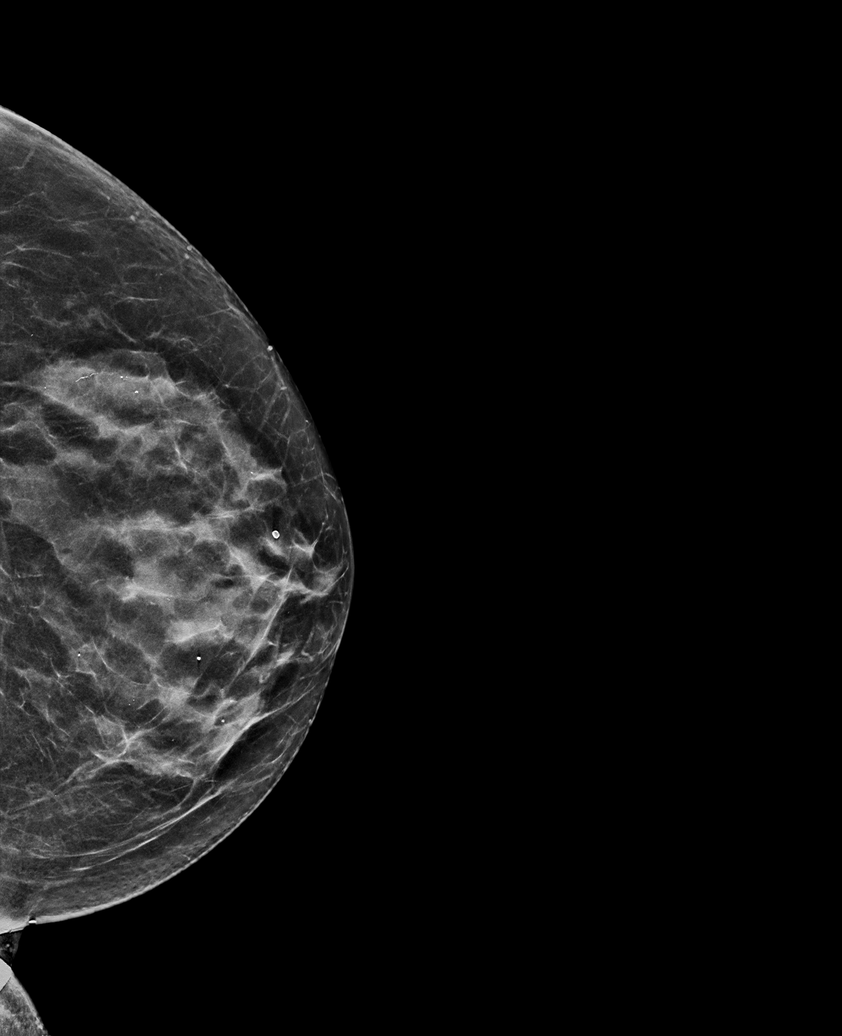

[R CC synth-2D]
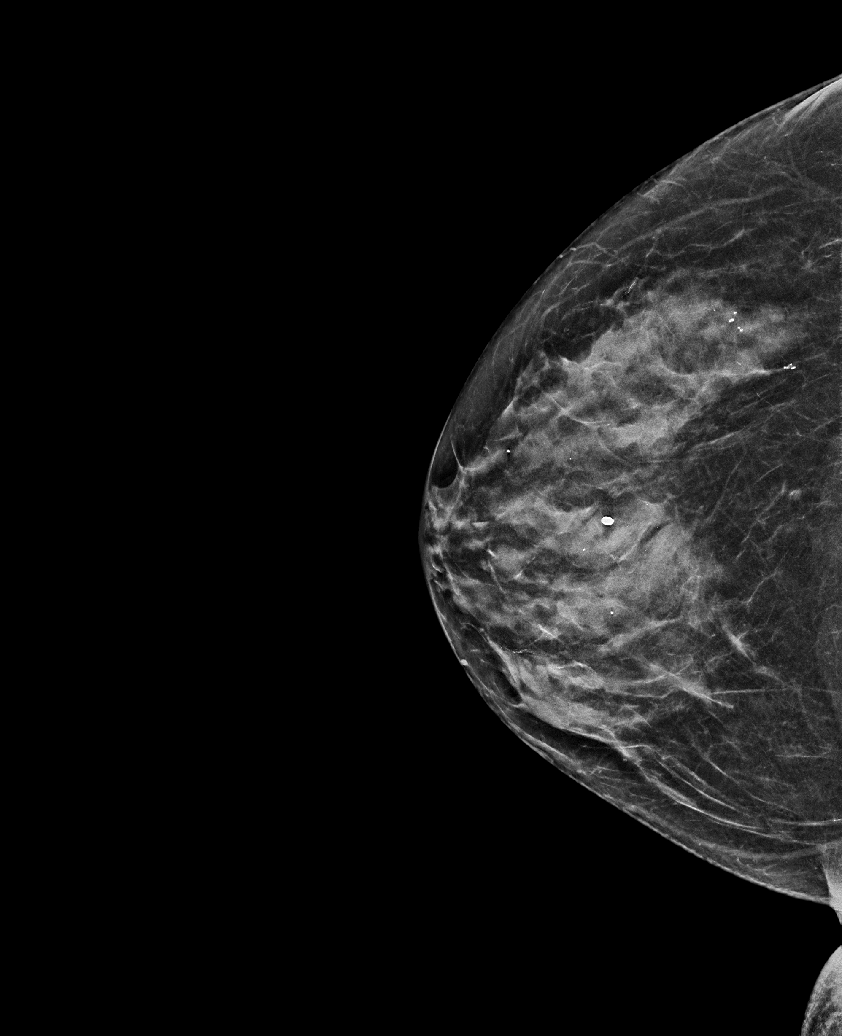

[R MLO synth-2D (1 of 2)]
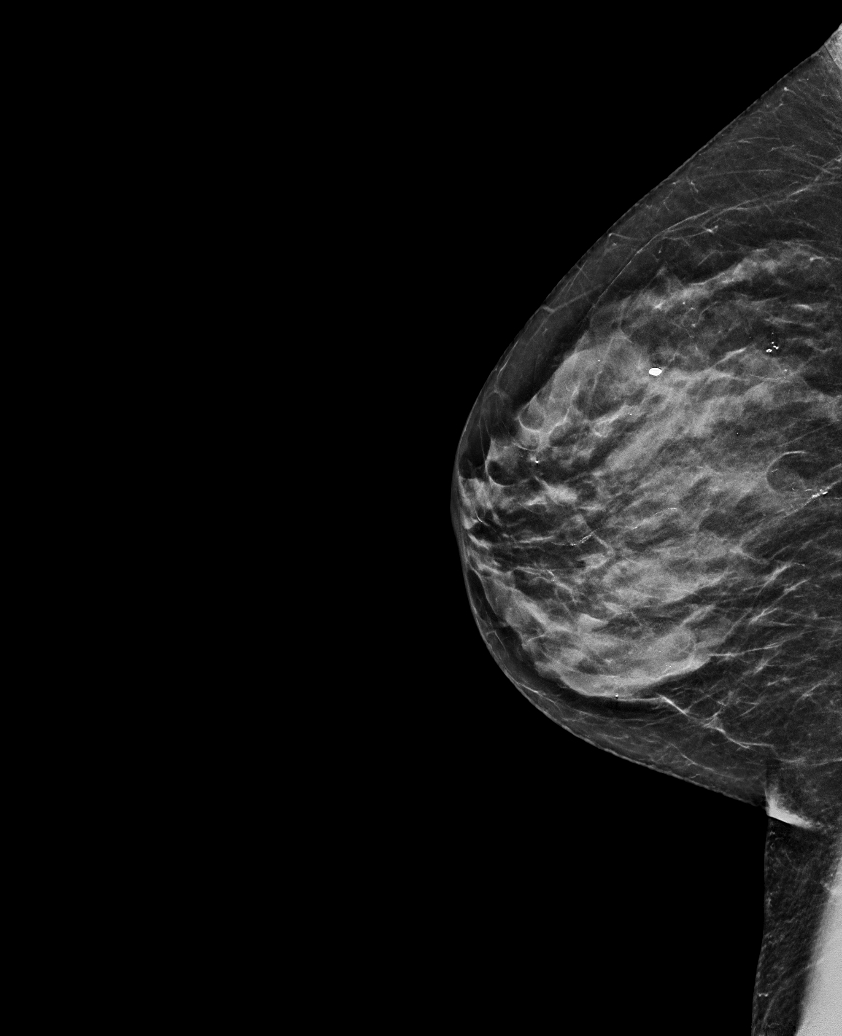

[L MLO synth-2D]
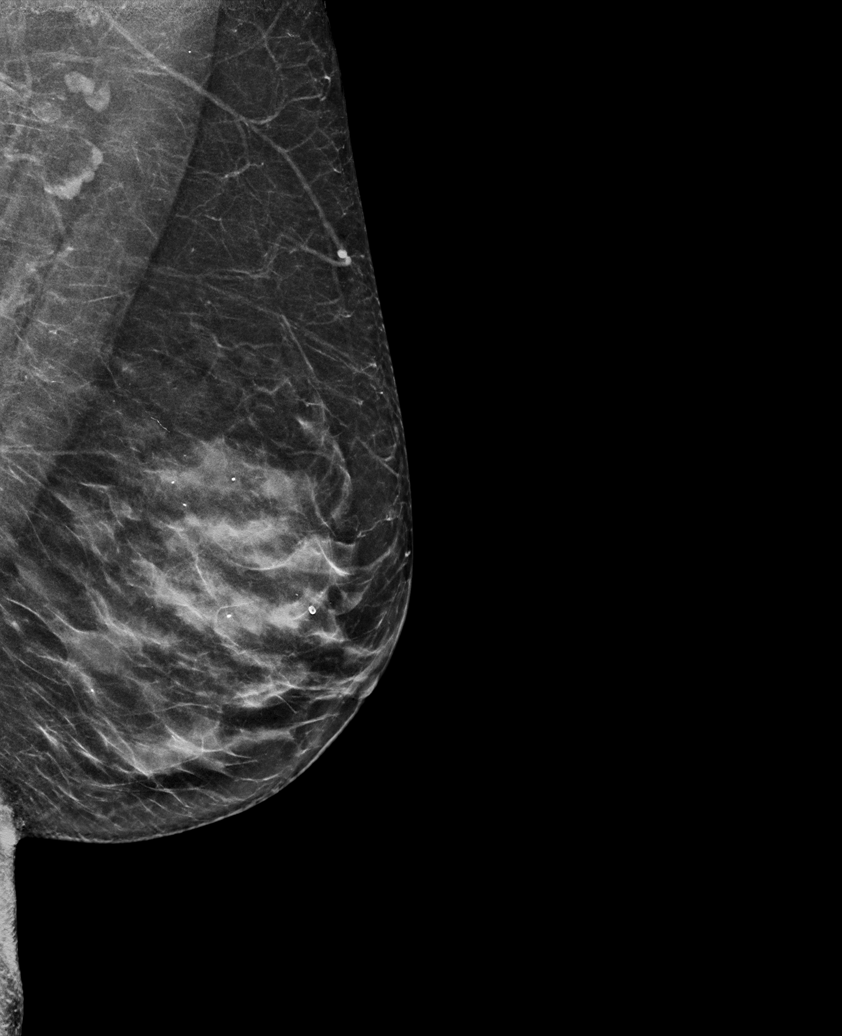

[R MLO synth-2D (2 of 2)]
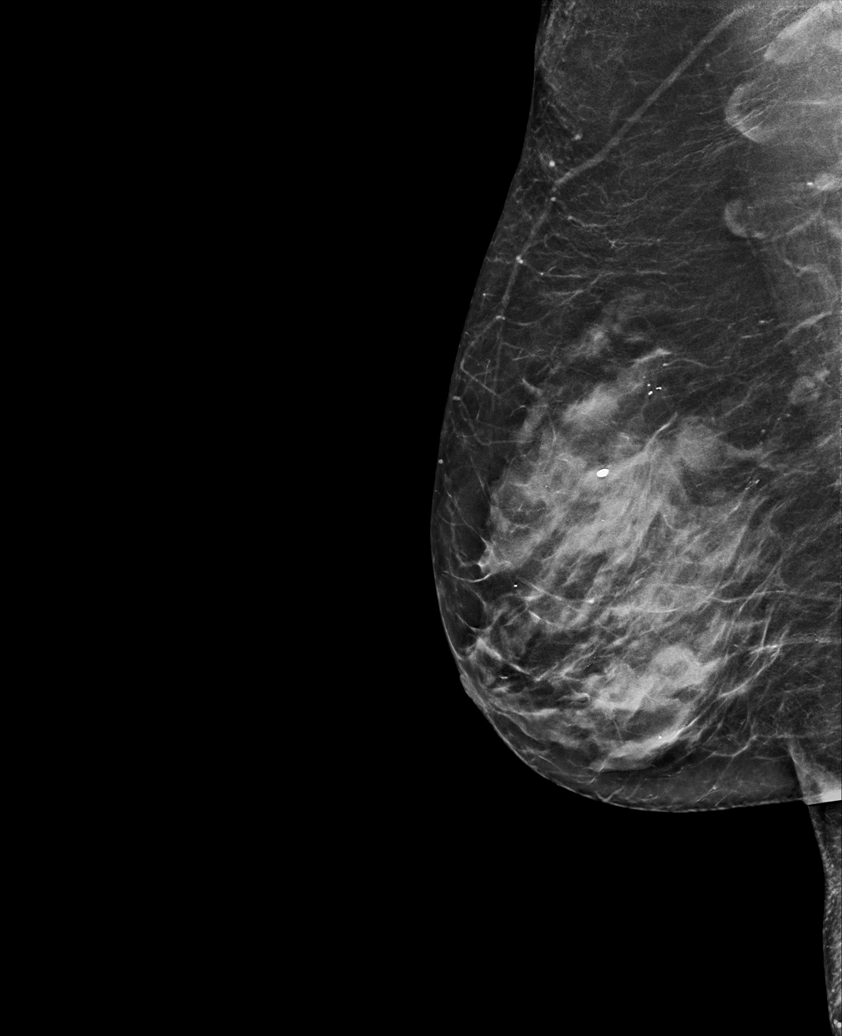

[R MLO tomo · tomo slice 25/50.0]
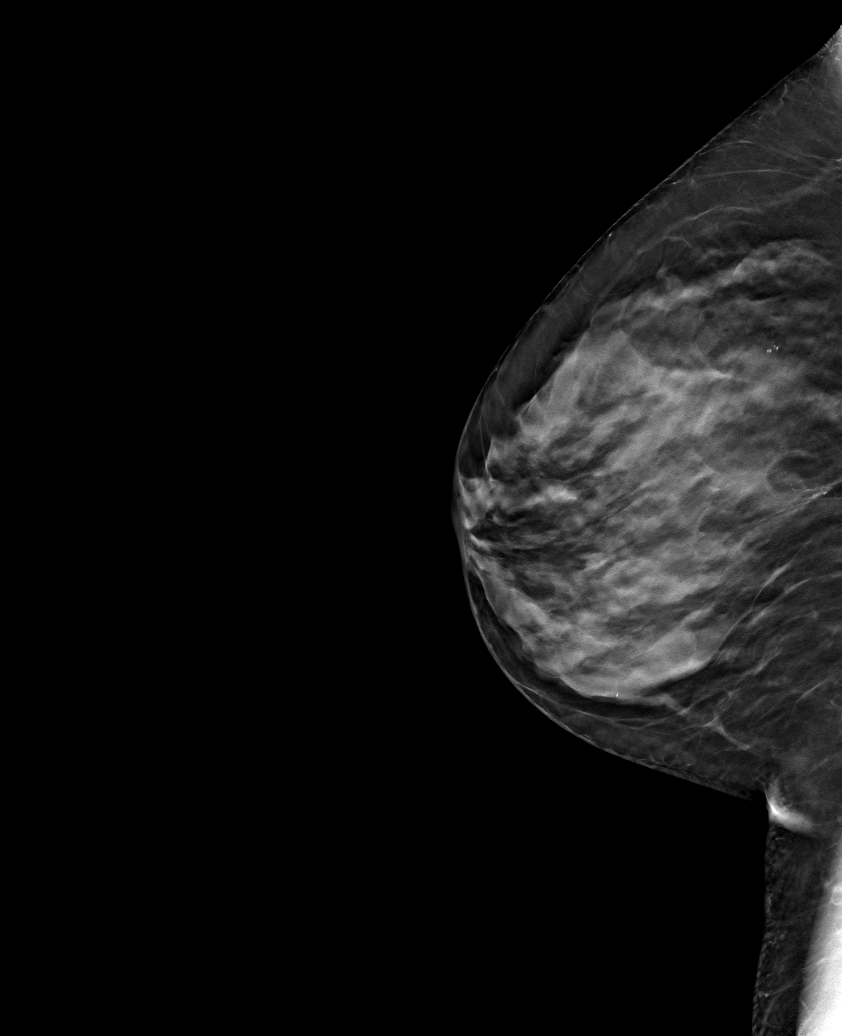

[6 of 30 positions shown; findings below may reference images not displayed]

ACR Breast Density Category c: The breast tissue is heterogeneously
dense, which may obscure small masses.
FINDINGS: No suspicious mass, distortion, or microcalcifications are
identified to suggest presence of malignancy.

Mammographic images were processed with CAD.

On physical exam, I palpate no abnormality in the UPPER INNER
QUADRANT of the RIGHT breast.

Targeted ultrasound is performed, showing faint focal area of
hyperechoic breast parenchyma in the 1 o'clock location of the RIGHT
breast 12 centimeters from the nipple, consistent with interval
resolution of fat necrosis.
IMPRESSION: Interval resolution of fat necrosis in the RIGHT breast. No
mammographic or ultrasound evidence for malignancy.

RECOMMENDATION:
Screening mammogram in one year.(Code:[AZ])

I have discussed the findings and recommendations with the patient.
Results were also provided in writing at the conclusion of the
visit. If applicable, a reminder letter will be sent to the patient
regarding the next appointment.

BI-RADS CATEGORY  1: Negative.

## 2018-06-26 DIAGNOSIS — M722 Plantar fascial fibromatosis: Secondary | ICD-10-CM | POA: Diagnosis not present

## 2018-10-16 DIAGNOSIS — E538 Deficiency of other specified B group vitamins: Secondary | ICD-10-CM | POA: Diagnosis not present

## 2018-10-16 DIAGNOSIS — Z0000 Encounter for general adult medical examination without abnormal findings: Secondary | ICD-10-CM | POA: Diagnosis not present

## 2018-10-16 DIAGNOSIS — E7800 Pure hypercholesterolemia, unspecified: Secondary | ICD-10-CM | POA: Diagnosis not present

## 2018-10-16 DIAGNOSIS — M159 Polyosteoarthritis, unspecified: Secondary | ICD-10-CM | POA: Diagnosis not present

## 2018-10-16 DIAGNOSIS — I10 Essential (primary) hypertension: Secondary | ICD-10-CM | POA: Diagnosis not present

## 2018-10-16 DIAGNOSIS — Z780 Asymptomatic menopausal state: Secondary | ICD-10-CM | POA: Diagnosis not present

## 2018-10-16 DIAGNOSIS — E079 Disorder of thyroid, unspecified: Secondary | ICD-10-CM | POA: Diagnosis not present

## 2018-10-23 DIAGNOSIS — M8588 Other specified disorders of bone density and structure, other site: Secondary | ICD-10-CM | POA: Diagnosis not present

## 2018-11-13 DIAGNOSIS — I10 Essential (primary) hypertension: Secondary | ICD-10-CM | POA: Diagnosis not present

## 2018-11-13 DIAGNOSIS — E7800 Pure hypercholesterolemia, unspecified: Secondary | ICD-10-CM | POA: Diagnosis not present

## 2018-11-13 DIAGNOSIS — E079 Disorder of thyroid, unspecified: Secondary | ICD-10-CM | POA: Diagnosis not present

## 2018-11-13 DIAGNOSIS — E538 Deficiency of other specified B group vitamins: Secondary | ICD-10-CM | POA: Diagnosis not present

## 2018-11-14 DIAGNOSIS — M546 Pain in thoracic spine: Secondary | ICD-10-CM | POA: Diagnosis not present

## 2018-11-14 DIAGNOSIS — M9903 Segmental and somatic dysfunction of lumbar region: Secondary | ICD-10-CM | POA: Diagnosis not present

## 2018-11-14 DIAGNOSIS — M461 Sacroiliitis, not elsewhere classified: Secondary | ICD-10-CM | POA: Diagnosis not present

## 2018-11-14 DIAGNOSIS — M9902 Segmental and somatic dysfunction of thoracic region: Secondary | ICD-10-CM | POA: Diagnosis not present

## 2018-11-14 DIAGNOSIS — M9905 Segmental and somatic dysfunction of pelvic region: Secondary | ICD-10-CM | POA: Diagnosis not present

## 2018-11-14 DIAGNOSIS — M5442 Lumbago with sciatica, left side: Secondary | ICD-10-CM | POA: Diagnosis not present

## 2018-11-16 DIAGNOSIS — M9905 Segmental and somatic dysfunction of pelvic region: Secondary | ICD-10-CM | POA: Diagnosis not present

## 2018-11-16 DIAGNOSIS — M546 Pain in thoracic spine: Secondary | ICD-10-CM | POA: Diagnosis not present

## 2018-11-16 DIAGNOSIS — M9902 Segmental and somatic dysfunction of thoracic region: Secondary | ICD-10-CM | POA: Diagnosis not present

## 2018-11-16 DIAGNOSIS — M9903 Segmental and somatic dysfunction of lumbar region: Secondary | ICD-10-CM | POA: Diagnosis not present

## 2018-11-16 DIAGNOSIS — M5442 Lumbago with sciatica, left side: Secondary | ICD-10-CM | POA: Diagnosis not present

## 2018-11-16 DIAGNOSIS — M461 Sacroiliitis, not elsewhere classified: Secondary | ICD-10-CM | POA: Diagnosis not present

## 2018-11-20 DIAGNOSIS — I10 Essential (primary) hypertension: Secondary | ICD-10-CM | POA: Diagnosis not present

## 2018-11-20 DIAGNOSIS — E538 Deficiency of other specified B group vitamins: Secondary | ICD-10-CM | POA: Diagnosis not present

## 2018-11-20 DIAGNOSIS — M159 Polyosteoarthritis, unspecified: Secondary | ICD-10-CM | POA: Diagnosis not present

## 2018-11-20 DIAGNOSIS — E079 Disorder of thyroid, unspecified: Secondary | ICD-10-CM | POA: Diagnosis not present

## 2018-11-20 DIAGNOSIS — E7800 Pure hypercholesterolemia, unspecified: Secondary | ICD-10-CM | POA: Diagnosis not present

## 2018-11-20 DIAGNOSIS — Z0000 Encounter for general adult medical examination without abnormal findings: Secondary | ICD-10-CM | POA: Diagnosis not present

## 2018-11-21 DIAGNOSIS — M9903 Segmental and somatic dysfunction of lumbar region: Secondary | ICD-10-CM | POA: Diagnosis not present

## 2018-11-21 DIAGNOSIS — M546 Pain in thoracic spine: Secondary | ICD-10-CM | POA: Diagnosis not present

## 2018-11-21 DIAGNOSIS — M9902 Segmental and somatic dysfunction of thoracic region: Secondary | ICD-10-CM | POA: Diagnosis not present

## 2018-11-21 DIAGNOSIS — M461 Sacroiliitis, not elsewhere classified: Secondary | ICD-10-CM | POA: Diagnosis not present

## 2018-11-21 DIAGNOSIS — M9905 Segmental and somatic dysfunction of pelvic region: Secondary | ICD-10-CM | POA: Diagnosis not present

## 2018-11-21 DIAGNOSIS — M5442 Lumbago with sciatica, left side: Secondary | ICD-10-CM | POA: Diagnosis not present

## 2018-11-23 DIAGNOSIS — M546 Pain in thoracic spine: Secondary | ICD-10-CM | POA: Diagnosis not present

## 2018-11-23 DIAGNOSIS — M9903 Segmental and somatic dysfunction of lumbar region: Secondary | ICD-10-CM | POA: Diagnosis not present

## 2018-11-23 DIAGNOSIS — M9902 Segmental and somatic dysfunction of thoracic region: Secondary | ICD-10-CM | POA: Diagnosis not present

## 2018-11-23 DIAGNOSIS — M5442 Lumbago with sciatica, left side: Secondary | ICD-10-CM | POA: Diagnosis not present

## 2018-11-23 DIAGNOSIS — M461 Sacroiliitis, not elsewhere classified: Secondary | ICD-10-CM | POA: Diagnosis not present

## 2018-11-23 DIAGNOSIS — M9905 Segmental and somatic dysfunction of pelvic region: Secondary | ICD-10-CM | POA: Diagnosis not present

## 2018-11-27 DIAGNOSIS — M9905 Segmental and somatic dysfunction of pelvic region: Secondary | ICD-10-CM | POA: Diagnosis not present

## 2018-11-27 DIAGNOSIS — M5442 Lumbago with sciatica, left side: Secondary | ICD-10-CM | POA: Diagnosis not present

## 2018-11-27 DIAGNOSIS — M461 Sacroiliitis, not elsewhere classified: Secondary | ICD-10-CM | POA: Diagnosis not present

## 2018-11-27 DIAGNOSIS — M9902 Segmental and somatic dysfunction of thoracic region: Secondary | ICD-10-CM | POA: Diagnosis not present

## 2018-11-27 DIAGNOSIS — M9903 Segmental and somatic dysfunction of lumbar region: Secondary | ICD-10-CM | POA: Diagnosis not present

## 2018-11-27 DIAGNOSIS — M546 Pain in thoracic spine: Secondary | ICD-10-CM | POA: Diagnosis not present

## 2018-11-29 DIAGNOSIS — M9903 Segmental and somatic dysfunction of lumbar region: Secondary | ICD-10-CM | POA: Diagnosis not present

## 2018-11-29 DIAGNOSIS — M9902 Segmental and somatic dysfunction of thoracic region: Secondary | ICD-10-CM | POA: Diagnosis not present

## 2018-11-29 DIAGNOSIS — M5442 Lumbago with sciatica, left side: Secondary | ICD-10-CM | POA: Diagnosis not present

## 2018-11-29 DIAGNOSIS — M461 Sacroiliitis, not elsewhere classified: Secondary | ICD-10-CM | POA: Diagnosis not present

## 2018-11-29 DIAGNOSIS — M9905 Segmental and somatic dysfunction of pelvic region: Secondary | ICD-10-CM | POA: Diagnosis not present

## 2018-11-29 DIAGNOSIS — M546 Pain in thoracic spine: Secondary | ICD-10-CM | POA: Diagnosis not present

## 2018-12-04 DIAGNOSIS — M5442 Lumbago with sciatica, left side: Secondary | ICD-10-CM | POA: Diagnosis not present

## 2018-12-04 DIAGNOSIS — M461 Sacroiliitis, not elsewhere classified: Secondary | ICD-10-CM | POA: Diagnosis not present

## 2018-12-04 DIAGNOSIS — M9905 Segmental and somatic dysfunction of pelvic region: Secondary | ICD-10-CM | POA: Diagnosis not present

## 2018-12-04 DIAGNOSIS — M546 Pain in thoracic spine: Secondary | ICD-10-CM | POA: Diagnosis not present

## 2018-12-04 DIAGNOSIS — M9903 Segmental and somatic dysfunction of lumbar region: Secondary | ICD-10-CM | POA: Diagnosis not present

## 2018-12-04 DIAGNOSIS — M9902 Segmental and somatic dysfunction of thoracic region: Secondary | ICD-10-CM | POA: Diagnosis not present

## 2018-12-06 DIAGNOSIS — M546 Pain in thoracic spine: Secondary | ICD-10-CM | POA: Diagnosis not present

## 2018-12-06 DIAGNOSIS — M9902 Segmental and somatic dysfunction of thoracic region: Secondary | ICD-10-CM | POA: Diagnosis not present

## 2018-12-06 DIAGNOSIS — M9903 Segmental and somatic dysfunction of lumbar region: Secondary | ICD-10-CM | POA: Diagnosis not present

## 2018-12-06 DIAGNOSIS — M5442 Lumbago with sciatica, left side: Secondary | ICD-10-CM | POA: Diagnosis not present

## 2018-12-06 DIAGNOSIS — M9905 Segmental and somatic dysfunction of pelvic region: Secondary | ICD-10-CM | POA: Diagnosis not present

## 2018-12-06 DIAGNOSIS — M461 Sacroiliitis, not elsewhere classified: Secondary | ICD-10-CM | POA: Diagnosis not present

## 2018-12-13 DIAGNOSIS — M9902 Segmental and somatic dysfunction of thoracic region: Secondary | ICD-10-CM | POA: Diagnosis not present

## 2018-12-13 DIAGNOSIS — M461 Sacroiliitis, not elsewhere classified: Secondary | ICD-10-CM | POA: Diagnosis not present

## 2018-12-13 DIAGNOSIS — M9903 Segmental and somatic dysfunction of lumbar region: Secondary | ICD-10-CM | POA: Diagnosis not present

## 2018-12-13 DIAGNOSIS — M9905 Segmental and somatic dysfunction of pelvic region: Secondary | ICD-10-CM | POA: Diagnosis not present

## 2018-12-13 DIAGNOSIS — M5442 Lumbago with sciatica, left side: Secondary | ICD-10-CM | POA: Diagnosis not present

## 2018-12-13 DIAGNOSIS — M546 Pain in thoracic spine: Secondary | ICD-10-CM | POA: Diagnosis not present

## 2018-12-20 DIAGNOSIS — M9903 Segmental and somatic dysfunction of lumbar region: Secondary | ICD-10-CM | POA: Diagnosis not present

## 2018-12-20 DIAGNOSIS — M5442 Lumbago with sciatica, left side: Secondary | ICD-10-CM | POA: Diagnosis not present

## 2018-12-20 DIAGNOSIS — M546 Pain in thoracic spine: Secondary | ICD-10-CM | POA: Diagnosis not present

## 2018-12-20 DIAGNOSIS — M461 Sacroiliitis, not elsewhere classified: Secondary | ICD-10-CM | POA: Diagnosis not present

## 2018-12-20 DIAGNOSIS — M9902 Segmental and somatic dysfunction of thoracic region: Secondary | ICD-10-CM | POA: Diagnosis not present

## 2018-12-20 DIAGNOSIS — M9905 Segmental and somatic dysfunction of pelvic region: Secondary | ICD-10-CM | POA: Diagnosis not present

## 2019-01-03 DIAGNOSIS — M461 Sacroiliitis, not elsewhere classified: Secondary | ICD-10-CM | POA: Diagnosis not present

## 2019-01-03 DIAGNOSIS — M9902 Segmental and somatic dysfunction of thoracic region: Secondary | ICD-10-CM | POA: Diagnosis not present

## 2019-01-03 DIAGNOSIS — M5442 Lumbago with sciatica, left side: Secondary | ICD-10-CM | POA: Diagnosis not present

## 2019-01-03 DIAGNOSIS — M546 Pain in thoracic spine: Secondary | ICD-10-CM | POA: Diagnosis not present

## 2019-01-03 DIAGNOSIS — M9903 Segmental and somatic dysfunction of lumbar region: Secondary | ICD-10-CM | POA: Diagnosis not present

## 2019-01-03 DIAGNOSIS — M9905 Segmental and somatic dysfunction of pelvic region: Secondary | ICD-10-CM | POA: Diagnosis not present

## 2019-03-07 DIAGNOSIS — M955 Acquired deformity of pelvis: Secondary | ICD-10-CM | POA: Diagnosis not present

## 2019-03-07 DIAGNOSIS — M5416 Radiculopathy, lumbar region: Secondary | ICD-10-CM | POA: Diagnosis not present

## 2019-03-07 DIAGNOSIS — M9903 Segmental and somatic dysfunction of lumbar region: Secondary | ICD-10-CM | POA: Diagnosis not present

## 2019-03-07 DIAGNOSIS — M9905 Segmental and somatic dysfunction of pelvic region: Secondary | ICD-10-CM | POA: Diagnosis not present

## 2019-03-09 DIAGNOSIS — M955 Acquired deformity of pelvis: Secondary | ICD-10-CM | POA: Diagnosis not present

## 2019-03-09 DIAGNOSIS — M9903 Segmental and somatic dysfunction of lumbar region: Secondary | ICD-10-CM | POA: Diagnosis not present

## 2019-03-09 DIAGNOSIS — M5416 Radiculopathy, lumbar region: Secondary | ICD-10-CM | POA: Diagnosis not present

## 2019-03-09 DIAGNOSIS — M9905 Segmental and somatic dysfunction of pelvic region: Secondary | ICD-10-CM | POA: Diagnosis not present

## 2019-03-12 DIAGNOSIS — M955 Acquired deformity of pelvis: Secondary | ICD-10-CM | POA: Diagnosis not present

## 2019-03-12 DIAGNOSIS — M9903 Segmental and somatic dysfunction of lumbar region: Secondary | ICD-10-CM | POA: Diagnosis not present

## 2019-03-12 DIAGNOSIS — M5416 Radiculopathy, lumbar region: Secondary | ICD-10-CM | POA: Diagnosis not present

## 2019-03-12 DIAGNOSIS — M9905 Segmental and somatic dysfunction of pelvic region: Secondary | ICD-10-CM | POA: Diagnosis not present

## 2019-03-14 DIAGNOSIS — M5416 Radiculopathy, lumbar region: Secondary | ICD-10-CM | POA: Diagnosis not present

## 2019-03-14 DIAGNOSIS — M9905 Segmental and somatic dysfunction of pelvic region: Secondary | ICD-10-CM | POA: Diagnosis not present

## 2019-03-14 DIAGNOSIS — M9903 Segmental and somatic dysfunction of lumbar region: Secondary | ICD-10-CM | POA: Diagnosis not present

## 2019-03-14 DIAGNOSIS — M955 Acquired deformity of pelvis: Secondary | ICD-10-CM | POA: Diagnosis not present

## 2019-03-16 DIAGNOSIS — M5416 Radiculopathy, lumbar region: Secondary | ICD-10-CM | POA: Diagnosis not present

## 2019-03-16 DIAGNOSIS — M9903 Segmental and somatic dysfunction of lumbar region: Secondary | ICD-10-CM | POA: Diagnosis not present

## 2019-03-16 DIAGNOSIS — M955 Acquired deformity of pelvis: Secondary | ICD-10-CM | POA: Diagnosis not present

## 2019-03-16 DIAGNOSIS — M9905 Segmental and somatic dysfunction of pelvic region: Secondary | ICD-10-CM | POA: Diagnosis not present

## 2019-03-19 DIAGNOSIS — M9905 Segmental and somatic dysfunction of pelvic region: Secondary | ICD-10-CM | POA: Diagnosis not present

## 2019-03-19 DIAGNOSIS — M9903 Segmental and somatic dysfunction of lumbar region: Secondary | ICD-10-CM | POA: Diagnosis not present

## 2019-03-19 DIAGNOSIS — M955 Acquired deformity of pelvis: Secondary | ICD-10-CM | POA: Diagnosis not present

## 2019-03-19 DIAGNOSIS — M5416 Radiculopathy, lumbar region: Secondary | ICD-10-CM | POA: Diagnosis not present

## 2019-03-21 DIAGNOSIS — M9903 Segmental and somatic dysfunction of lumbar region: Secondary | ICD-10-CM | POA: Diagnosis not present

## 2019-03-21 DIAGNOSIS — M5416 Radiculopathy, lumbar region: Secondary | ICD-10-CM | POA: Diagnosis not present

## 2019-03-21 DIAGNOSIS — M955 Acquired deformity of pelvis: Secondary | ICD-10-CM | POA: Diagnosis not present

## 2019-03-21 DIAGNOSIS — M9905 Segmental and somatic dysfunction of pelvic region: Secondary | ICD-10-CM | POA: Diagnosis not present

## 2019-03-23 DIAGNOSIS — M9905 Segmental and somatic dysfunction of pelvic region: Secondary | ICD-10-CM | POA: Diagnosis not present

## 2019-03-23 DIAGNOSIS — M9903 Segmental and somatic dysfunction of lumbar region: Secondary | ICD-10-CM | POA: Diagnosis not present

## 2019-03-23 DIAGNOSIS — M5416 Radiculopathy, lumbar region: Secondary | ICD-10-CM | POA: Diagnosis not present

## 2019-03-23 DIAGNOSIS — M955 Acquired deformity of pelvis: Secondary | ICD-10-CM | POA: Diagnosis not present

## 2019-03-27 DIAGNOSIS — M9903 Segmental and somatic dysfunction of lumbar region: Secondary | ICD-10-CM | POA: Diagnosis not present

## 2019-03-27 DIAGNOSIS — M9905 Segmental and somatic dysfunction of pelvic region: Secondary | ICD-10-CM | POA: Diagnosis not present

## 2019-03-27 DIAGNOSIS — M5416 Radiculopathy, lumbar region: Secondary | ICD-10-CM | POA: Diagnosis not present

## 2019-03-27 DIAGNOSIS — M955 Acquired deformity of pelvis: Secondary | ICD-10-CM | POA: Diagnosis not present

## 2019-03-29 DIAGNOSIS — R0789 Other chest pain: Secondary | ICD-10-CM | POA: Diagnosis not present

## 2019-03-29 DIAGNOSIS — F411 Generalized anxiety disorder: Secondary | ICD-10-CM | POA: Diagnosis not present

## 2019-03-30 DIAGNOSIS — M955 Acquired deformity of pelvis: Secondary | ICD-10-CM | POA: Diagnosis not present

## 2019-03-30 DIAGNOSIS — M5416 Radiculopathy, lumbar region: Secondary | ICD-10-CM | POA: Diagnosis not present

## 2019-03-30 DIAGNOSIS — M9905 Segmental and somatic dysfunction of pelvic region: Secondary | ICD-10-CM | POA: Diagnosis not present

## 2019-03-30 DIAGNOSIS — M9903 Segmental and somatic dysfunction of lumbar region: Secondary | ICD-10-CM | POA: Diagnosis not present

## 2019-04-03 DIAGNOSIS — M9903 Segmental and somatic dysfunction of lumbar region: Secondary | ICD-10-CM | POA: Diagnosis not present

## 2019-04-03 DIAGNOSIS — M5416 Radiculopathy, lumbar region: Secondary | ICD-10-CM | POA: Diagnosis not present

## 2019-04-03 DIAGNOSIS — M9905 Segmental and somatic dysfunction of pelvic region: Secondary | ICD-10-CM | POA: Diagnosis not present

## 2019-04-03 DIAGNOSIS — M955 Acquired deformity of pelvis: Secondary | ICD-10-CM | POA: Diagnosis not present

## 2019-04-06 DIAGNOSIS — M9903 Segmental and somatic dysfunction of lumbar region: Secondary | ICD-10-CM | POA: Diagnosis not present

## 2019-04-06 DIAGNOSIS — M955 Acquired deformity of pelvis: Secondary | ICD-10-CM | POA: Diagnosis not present

## 2019-04-06 DIAGNOSIS — M5416 Radiculopathy, lumbar region: Secondary | ICD-10-CM | POA: Diagnosis not present

## 2019-04-06 DIAGNOSIS — M9905 Segmental and somatic dysfunction of pelvic region: Secondary | ICD-10-CM | POA: Diagnosis not present

## 2019-04-09 DIAGNOSIS — M5416 Radiculopathy, lumbar region: Secondary | ICD-10-CM | POA: Diagnosis not present

## 2019-04-09 DIAGNOSIS — M9903 Segmental and somatic dysfunction of lumbar region: Secondary | ICD-10-CM | POA: Diagnosis not present

## 2019-04-09 DIAGNOSIS — M955 Acquired deformity of pelvis: Secondary | ICD-10-CM | POA: Diagnosis not present

## 2019-04-09 DIAGNOSIS — M9905 Segmental and somatic dysfunction of pelvic region: Secondary | ICD-10-CM | POA: Diagnosis not present

## 2019-04-23 DIAGNOSIS — M955 Acquired deformity of pelvis: Secondary | ICD-10-CM | POA: Diagnosis not present

## 2019-04-23 DIAGNOSIS — M9903 Segmental and somatic dysfunction of lumbar region: Secondary | ICD-10-CM | POA: Diagnosis not present

## 2019-04-23 DIAGNOSIS — M5416 Radiculopathy, lumbar region: Secondary | ICD-10-CM | POA: Diagnosis not present

## 2019-04-23 DIAGNOSIS — M9905 Segmental and somatic dysfunction of pelvic region: Secondary | ICD-10-CM | POA: Diagnosis not present

## 2019-05-07 DIAGNOSIS — M9905 Segmental and somatic dysfunction of pelvic region: Secondary | ICD-10-CM | POA: Diagnosis not present

## 2019-05-07 DIAGNOSIS — M955 Acquired deformity of pelvis: Secondary | ICD-10-CM | POA: Diagnosis not present

## 2019-05-07 DIAGNOSIS — M9903 Segmental and somatic dysfunction of lumbar region: Secondary | ICD-10-CM | POA: Diagnosis not present

## 2019-05-07 DIAGNOSIS — M5416 Radiculopathy, lumbar region: Secondary | ICD-10-CM | POA: Diagnosis not present

## 2019-05-14 ENCOUNTER — Other Ambulatory Visit: Payer: Self-pay | Admitting: Internal Medicine

## 2019-05-14 DIAGNOSIS — Z1231 Encounter for screening mammogram for malignant neoplasm of breast: Secondary | ICD-10-CM

## 2019-05-14 DIAGNOSIS — I10 Essential (primary) hypertension: Secondary | ICD-10-CM | POA: Diagnosis not present

## 2019-05-14 DIAGNOSIS — E538 Deficiency of other specified B group vitamins: Secondary | ICD-10-CM | POA: Diagnosis not present

## 2019-05-14 DIAGNOSIS — E7800 Pure hypercholesterolemia, unspecified: Secondary | ICD-10-CM | POA: Diagnosis not present

## 2019-05-21 DIAGNOSIS — M955 Acquired deformity of pelvis: Secondary | ICD-10-CM | POA: Diagnosis not present

## 2019-05-21 DIAGNOSIS — M5416 Radiculopathy, lumbar region: Secondary | ICD-10-CM | POA: Diagnosis not present

## 2019-05-21 DIAGNOSIS — M25562 Pain in left knee: Secondary | ICD-10-CM | POA: Diagnosis not present

## 2019-05-21 DIAGNOSIS — M1712 Unilateral primary osteoarthritis, left knee: Secondary | ICD-10-CM | POA: Diagnosis not present

## 2019-05-21 DIAGNOSIS — M9903 Segmental and somatic dysfunction of lumbar region: Secondary | ICD-10-CM | POA: Diagnosis not present

## 2019-05-21 DIAGNOSIS — G8929 Other chronic pain: Secondary | ICD-10-CM | POA: Diagnosis not present

## 2019-05-21 DIAGNOSIS — E079 Disorder of thyroid, unspecified: Secondary | ICD-10-CM | POA: Diagnosis not present

## 2019-05-21 DIAGNOSIS — I10 Essential (primary) hypertension: Secondary | ICD-10-CM | POA: Diagnosis not present

## 2019-05-21 DIAGNOSIS — M25561 Pain in right knee: Secondary | ICD-10-CM | POA: Diagnosis not present

## 2019-05-21 DIAGNOSIS — M1711 Unilateral primary osteoarthritis, right knee: Secondary | ICD-10-CM | POA: Diagnosis not present

## 2019-05-21 DIAGNOSIS — E538 Deficiency of other specified B group vitamins: Secondary | ICD-10-CM | POA: Diagnosis not present

## 2019-05-21 DIAGNOSIS — E7800 Pure hypercholesterolemia, unspecified: Secondary | ICD-10-CM | POA: Diagnosis not present

## 2019-05-21 DIAGNOSIS — M9905 Segmental and somatic dysfunction of pelvic region: Secondary | ICD-10-CM | POA: Diagnosis not present

## 2019-06-11 DIAGNOSIS — M9905 Segmental and somatic dysfunction of pelvic region: Secondary | ICD-10-CM | POA: Diagnosis not present

## 2019-06-11 DIAGNOSIS — M5416 Radiculopathy, lumbar region: Secondary | ICD-10-CM | POA: Diagnosis not present

## 2019-06-11 DIAGNOSIS — M9903 Segmental and somatic dysfunction of lumbar region: Secondary | ICD-10-CM | POA: Diagnosis not present

## 2019-06-11 DIAGNOSIS — M955 Acquired deformity of pelvis: Secondary | ICD-10-CM | POA: Diagnosis not present

## 2019-06-21 ENCOUNTER — Other Ambulatory Visit: Payer: Self-pay

## 2019-06-21 ENCOUNTER — Ambulatory Visit
Admission: RE | Admit: 2019-06-21 | Discharge: 2019-06-21 | Disposition: A | Discharge status: Home / Self Care | Payer: PPO | Source: Ambulatory Visit | Attending: Internal Medicine | Admitting: Internal Medicine

## 2019-06-21 DIAGNOSIS — Z1231 Encounter for screening mammogram for malignant neoplasm of breast: Secondary | ICD-10-CM | POA: Insufficient documentation

## 2019-06-21 IMAGING — MG DIGITAL SCREENING BILATERAL MAMMOGRAM WITH TOMO AND CAD
8 series · 8 of 24 positions shown · non-contrast
Comparison: Previous exam(s).

CLINICAL DATA: Screening.

EXAM:
DIGITAL SCREENING BILATERAL MAMMOGRAM WITH TOMO AND CAD

[L CC synth-2D]
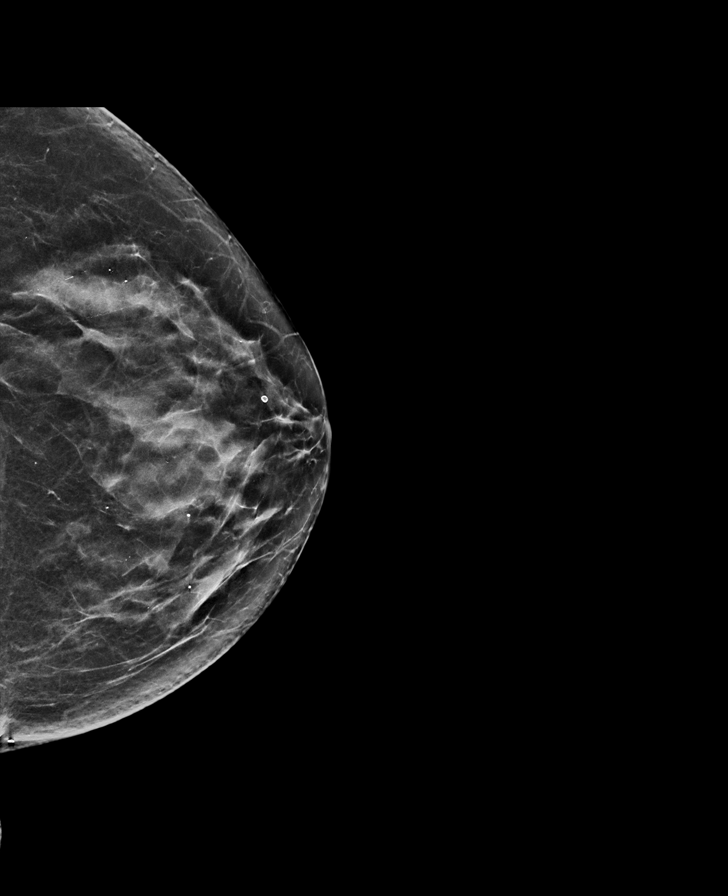

[R MLO synth-2D]
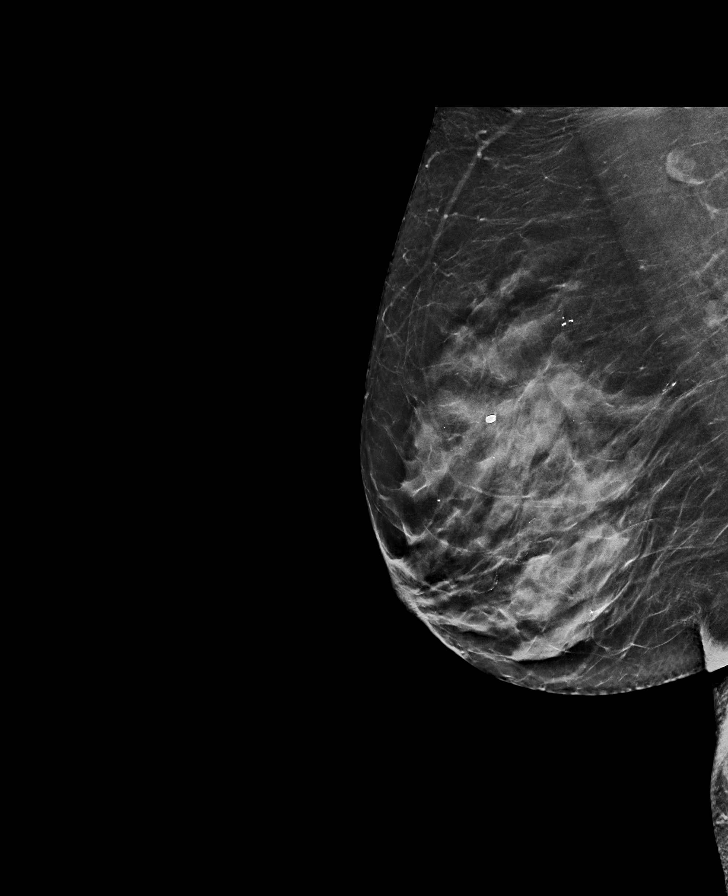

[R CC synth-2D]
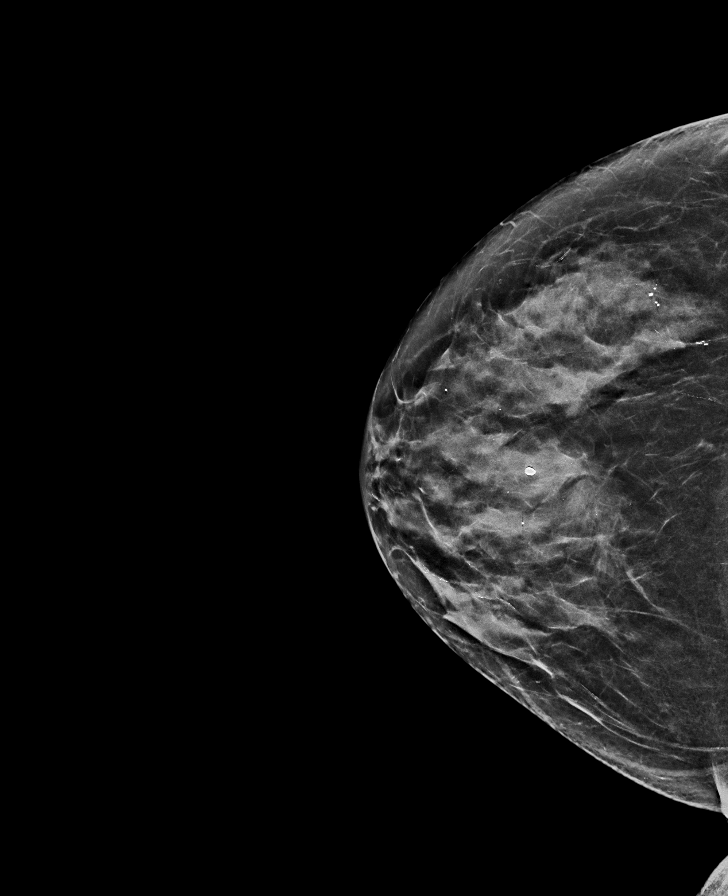

[L MLO synth-2D]
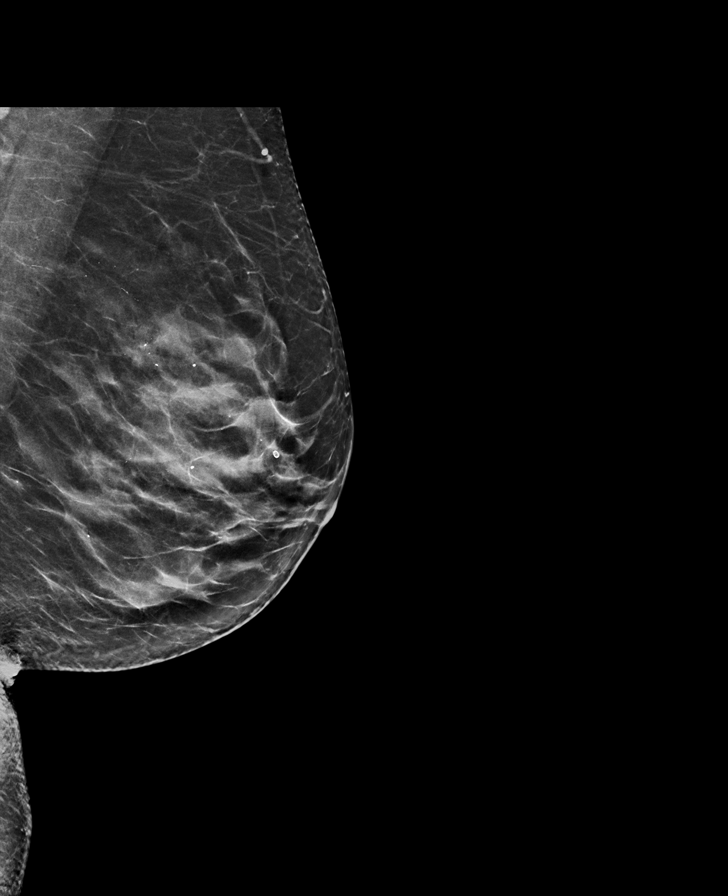

[R CC tomo · tomo slice 30/59.0]
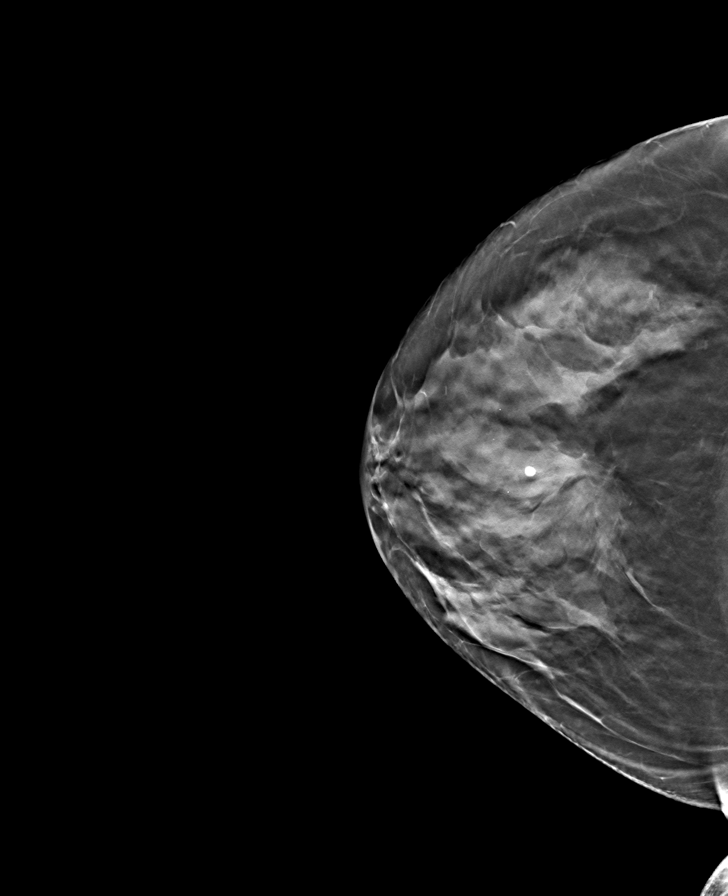

[R MLO tomo · tomo slice 34/67.0]
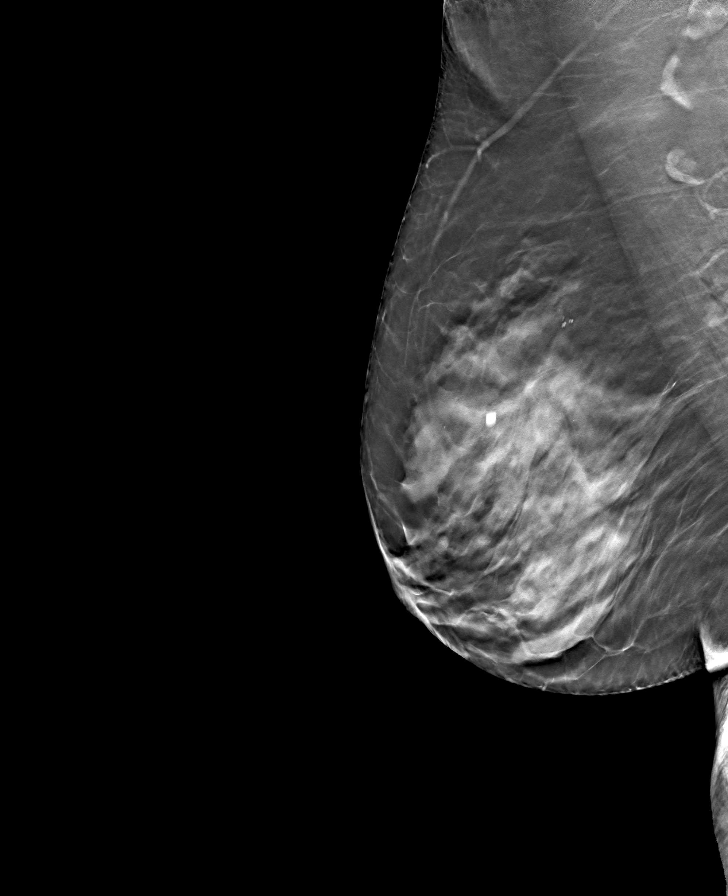

[L MLO tomo · tomo slice 34/67.0]
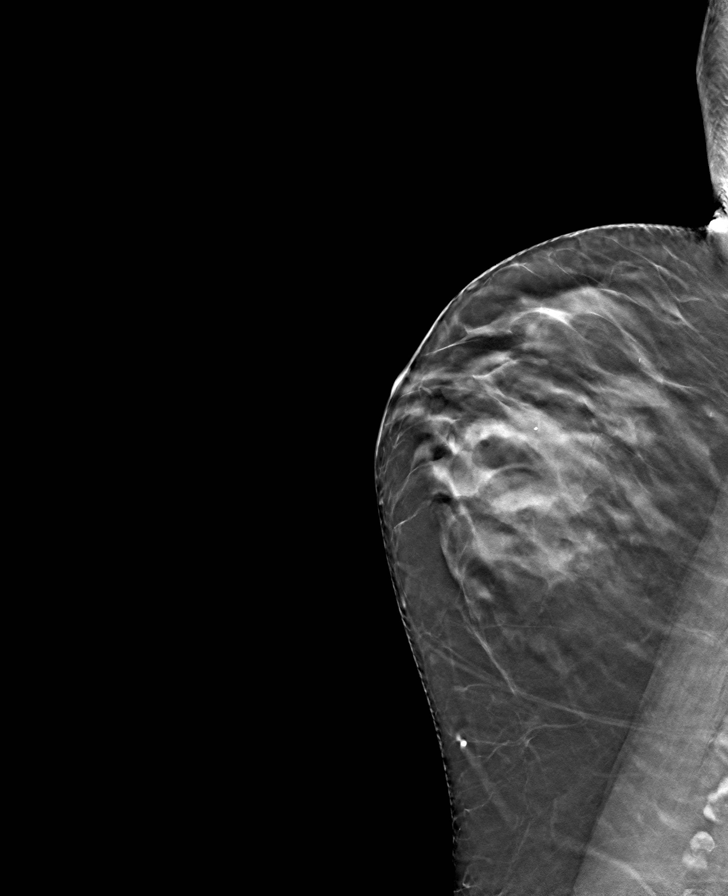

[L CC tomo · tomo slice 34/67.0]
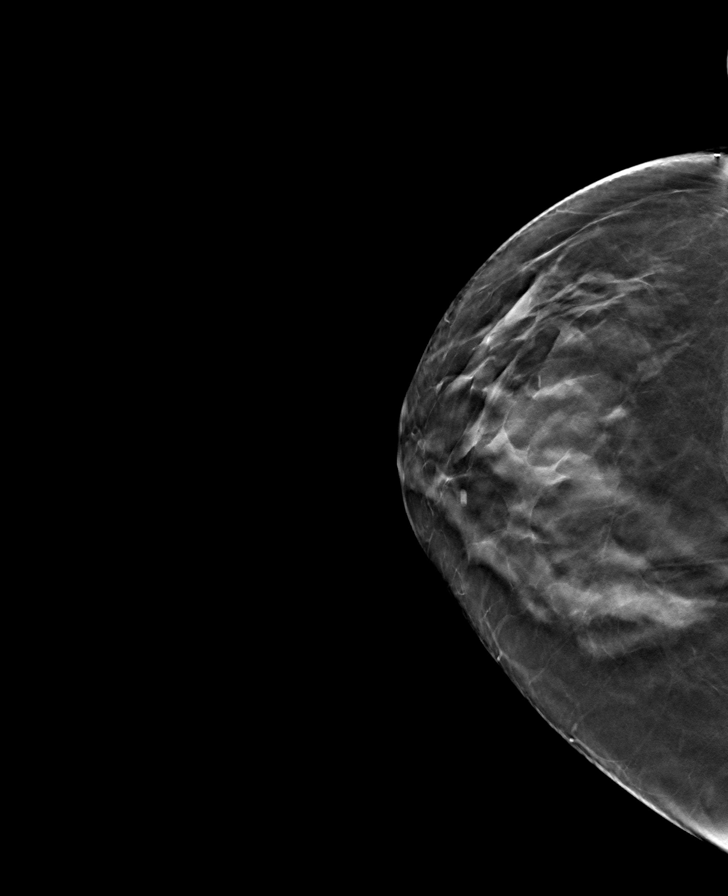

[8 of 24 positions shown; findings below may reference images not displayed]

ACR Breast Density Category c: The breast tissue is heterogeneously
dense, which may obscure small masses.
FINDINGS: There are no findings suspicious for malignancy. Images were
processed with CAD.
IMPRESSION: No mammographic evidence of malignancy. A result letter of this
screening mammogram will be mailed directly to the patient.

RECOMMENDATION:
Screening mammogram in one year. (Code:[5V])

BI-RADS CATEGORY  1: Negative.

## 2019-07-17 DIAGNOSIS — M955 Acquired deformity of pelvis: Secondary | ICD-10-CM | POA: Diagnosis not present

## 2019-07-17 DIAGNOSIS — M9905 Segmental and somatic dysfunction of pelvic region: Secondary | ICD-10-CM | POA: Diagnosis not present

## 2019-07-17 DIAGNOSIS — M5416 Radiculopathy, lumbar region: Secondary | ICD-10-CM | POA: Diagnosis not present

## 2019-07-17 DIAGNOSIS — M9903 Segmental and somatic dysfunction of lumbar region: Secondary | ICD-10-CM | POA: Diagnosis not present

## 2019-08-14 DIAGNOSIS — M9903 Segmental and somatic dysfunction of lumbar region: Secondary | ICD-10-CM | POA: Diagnosis not present

## 2019-08-14 DIAGNOSIS — M5416 Radiculopathy, lumbar region: Secondary | ICD-10-CM | POA: Diagnosis not present

## 2019-08-14 DIAGNOSIS — M9905 Segmental and somatic dysfunction of pelvic region: Secondary | ICD-10-CM | POA: Diagnosis not present

## 2019-08-14 DIAGNOSIS — M955 Acquired deformity of pelvis: Secondary | ICD-10-CM | POA: Diagnosis not present

## 2019-08-23 DIAGNOSIS — M170 Bilateral primary osteoarthritis of knee: Secondary | ICD-10-CM | POA: Diagnosis not present

## 2019-08-23 DIAGNOSIS — M25562 Pain in left knee: Secondary | ICD-10-CM | POA: Diagnosis not present

## 2019-08-23 DIAGNOSIS — M1712 Unilateral primary osteoarthritis, left knee: Secondary | ICD-10-CM | POA: Diagnosis not present

## 2019-08-23 DIAGNOSIS — G8929 Other chronic pain: Secondary | ICD-10-CM | POA: Diagnosis not present

## 2019-08-23 DIAGNOSIS — M25561 Pain in right knee: Secondary | ICD-10-CM | POA: Diagnosis not present

## 2019-08-23 DIAGNOSIS — M1711 Unilateral primary osteoarthritis, right knee: Secondary | ICD-10-CM | POA: Diagnosis not present

## 2019-10-11 DIAGNOSIS — M170 Bilateral primary osteoarthritis of knee: Secondary | ICD-10-CM | POA: Diagnosis not present

## 2019-11-14 DIAGNOSIS — E538 Deficiency of other specified B group vitamins: Secondary | ICD-10-CM | POA: Diagnosis not present

## 2019-11-14 DIAGNOSIS — I10 Essential (primary) hypertension: Secondary | ICD-10-CM | POA: Diagnosis not present

## 2019-11-14 DIAGNOSIS — E7800 Pure hypercholesterolemia, unspecified: Secondary | ICD-10-CM | POA: Diagnosis not present

## 2019-11-14 DIAGNOSIS — E079 Disorder of thyroid, unspecified: Secondary | ICD-10-CM | POA: Diagnosis not present

## 2019-11-16 DIAGNOSIS — Z20828 Contact with and (suspected) exposure to other viral communicable diseases: Secondary | ICD-10-CM | POA: Diagnosis not present

## 2019-11-22 ENCOUNTER — Encounter
Admission: RE | Admit: 2019-11-22 | Discharge: 2019-11-22 | Disposition: A | Discharge status: Home / Self Care | Payer: PPO | Source: Ambulatory Visit | Attending: Orthopedic Surgery | Admitting: Orthopedic Surgery

## 2019-11-22 ENCOUNTER — Other Ambulatory Visit: Payer: Self-pay

## 2019-11-22 DIAGNOSIS — Z8711 Personal history of peptic ulcer disease: Secondary | ICD-10-CM | POA: Diagnosis not present

## 2019-11-22 DIAGNOSIS — R519 Headache, unspecified: Secondary | ICD-10-CM | POA: Diagnosis not present

## 2019-11-22 DIAGNOSIS — K219 Gastro-esophageal reflux disease without esophagitis: Secondary | ICD-10-CM | POA: Diagnosis not present

## 2019-11-22 DIAGNOSIS — Z8249 Family history of ischemic heart disease and other diseases of the circulatory system: Secondary | ICD-10-CM | POA: Diagnosis not present

## 2019-11-22 DIAGNOSIS — Z800 Family history of malignant neoplasm of digestive organs: Secondary | ICD-10-CM | POA: Diagnosis not present

## 2019-11-22 DIAGNOSIS — G8929 Other chronic pain: Secondary | ICD-10-CM | POA: Diagnosis not present

## 2019-11-22 DIAGNOSIS — M79671 Pain in right foot: Secondary | ICD-10-CM | POA: Diagnosis not present

## 2019-11-22 DIAGNOSIS — Z79899 Other long term (current) drug therapy: Secondary | ICD-10-CM | POA: Diagnosis not present

## 2019-11-22 DIAGNOSIS — Z803 Family history of malignant neoplasm of breast: Secondary | ICD-10-CM | POA: Diagnosis not present

## 2019-11-22 DIAGNOSIS — Z8614 Personal history of Methicillin resistant Staphylococcus aureus infection: Secondary | ICD-10-CM | POA: Diagnosis not present

## 2019-11-22 DIAGNOSIS — Z820 Family history of epilepsy and other diseases of the nervous system: Secondary | ICD-10-CM | POA: Diagnosis not present

## 2019-11-22 DIAGNOSIS — Z90710 Acquired absence of both cervix and uterus: Secondary | ICD-10-CM | POA: Diagnosis not present

## 2019-11-22 DIAGNOSIS — E041 Nontoxic single thyroid nodule: Secondary | ICD-10-CM | POA: Diagnosis not present

## 2019-11-22 DIAGNOSIS — Z887 Allergy status to serum and vaccine status: Secondary | ICD-10-CM | POA: Diagnosis not present

## 2019-11-22 DIAGNOSIS — R011 Cardiac murmur, unspecified: Secondary | ICD-10-CM | POA: Diagnosis not present

## 2019-11-22 DIAGNOSIS — M170 Bilateral primary osteoarthritis of knee: Secondary | ICD-10-CM | POA: Diagnosis not present

## 2019-11-22 DIAGNOSIS — M1712 Unilateral primary osteoarthritis, left knee: Secondary | ICD-10-CM | POA: Diagnosis present

## 2019-11-22 DIAGNOSIS — N6019 Diffuse cystic mastopathy of unspecified breast: Secondary | ICD-10-CM | POA: Diagnosis not present

## 2019-11-22 DIAGNOSIS — I10 Essential (primary) hypertension: Secondary | ICD-10-CM | POA: Diagnosis not present

## 2019-11-22 HISTORY — DX: Family history of other specified conditions: Z84.89

## 2019-11-22 HISTORY — DX: Methicillin resistant Staphylococcus aureus infection as the cause of diseases classified elsewhere: L03.90

## 2019-11-22 HISTORY — DX: Unspecified osteoarthritis, unspecified site: M19.90

## 2019-11-22 HISTORY — DX: Nontoxic single thyroid nodule: E04.1

## 2019-11-22 HISTORY — DX: Leukoplakia of vulva: N90.4

## 2019-11-22 HISTORY — DX: Cortical age-related cataract, unspecified eye: H25.019

## 2019-11-22 HISTORY — DX: Methicillin resistant Staphylococcus aureus infection as the cause of diseases classified elsewhere: B95.62

## 2019-11-22 HISTORY — DX: Other female intestinal-genital tract fistulae: N82.4

## 2019-11-22 HISTORY — DX: Diverticulosis of intestine, part unspecified, without perforation or abscess without bleeding: K57.90

## 2019-11-22 HISTORY — DX: Diffuse cystic mastopathy of unspecified breast: N60.19

## 2019-11-22 HISTORY — DX: Other chronic pain: G89.29

## 2019-11-22 NOTE — Patient Instructions (Signed)
Your procedure is scheduled on: Mon 1/25 Report to Day Surgery.  Medical Cleotis Lema To find out your arrival time please call [phone] between 1PM - 3PM on Friday 1/22  Remember: Instructions that are not followed completely may result in serious medical risk,  up to and including death, or upon the discretion of your surgeon and anesthesiologist your  surgery may need to be rescheduled.     _X__ 1. Do not eat food after midnight the night before your procedure.                 No gum chewing or hard candies. You may drink clear liquids up to 2 hours                 before you are scheduled to arrive for your surgery- DO not drink clear                 liquids within 2 hours of the start of your surgery.                 Clear Liquids include:  water, apple juice without pulp, clear carbohydrate                 Ensure Gatorade, Black Coffee or Tea (Do not add                 anything to coffee or tea).  __X__2.  On the morning of surgery brush your teeth with toothpaste and water, you                may rinse your mouth with mouthwash if you wish.  Do not swallow any toothpaste of mouthwash.     __ 3.  No Alcohol for 24 hours before or after surgery.   ___ 4.  Do Not Smoke or use e-cigarettes For 24 Hours Prior to Your Surgery.                 Do not use any chewable tobacco products for at least 6 hours prior to                 surgery.  ____  5.  Bring all medications with you on the day of surgery if instructed.   __x__  6.  Notify your doctor if there is any change in your medical condition      (cold, fever, infections).     Do not wear jewelry, make-up, hairpins, clips or nail polish. Do not wear lotions, powders, or perfumes. You may wear deodorant. Do not shave 48 hours prior to surgery. Men may shave face and neck. Do not bring valuables to the hospital.    [facility] is not responsible for any belongings or valuables.  Contacts, dentures or  bridgework may not be worn into surgery. Leave your suitcase in the car. After surgery it may be brought to your room. For patients admitted to the hospital, discharge time is determined by your treatment team.   Patients discharged the day of surgery will not be allowed to drive home.   Please read over the following fact sheets that you were given:    _x___ Take these medicines the morning of surgery with A SIP OF WATER:    1. none  2.   3.   4.  5.  6.  ____ Fleet Enema (as directed)   __x__ Use CHG Soap as directed  ____ Use inhalers on the day of surgery  ____ Stop metformin 2 days prior to surgery    ____ Take 1/2 of usual insulin dose the night before surgery. No insulin the morning          of surgery.   ____ Stop Coumadin/Plavix/aspirin on   __x__ Stop Anti-inflammatories ibuprofen aleve or aspirin until after surgery  May take tylenol   _x___ Stop supplements until after surgery.    ____ Bring C-Pap to the hospital.

## 2019-11-23 ENCOUNTER — Other Ambulatory Visit: Payer: PPO

## 2019-11-23 ENCOUNTER — Encounter
Admission: RE | Admit: 2019-11-23 | Discharge: 2019-11-23 | Disposition: A | Discharge status: Home / Self Care | Payer: PPO | Source: Ambulatory Visit | Attending: Orthopedic Surgery | Admitting: Orthopedic Surgery

## 2019-11-23 DIAGNOSIS — Z01818 Encounter for other preprocedural examination: Secondary | ICD-10-CM | POA: Insufficient documentation

## 2019-11-23 DIAGNOSIS — Z01810 Encounter for preprocedural cardiovascular examination: Secondary | ICD-10-CM | POA: Diagnosis not present

## 2019-11-23 DIAGNOSIS — M1712 Unilateral primary osteoarthritis, left knee: Secondary | ICD-10-CM | POA: Diagnosis not present

## 2019-11-23 LAB — URINALYSIS, ROUTINE W REFLEX MICROSCOPIC
Bilirubin Urine: NEGATIVE
Glucose, UA: NEGATIVE mg/dL
Hgb urine dipstick: NEGATIVE
Ketones, ur: 5 mg/dL — AB
Nitrite: NEGATIVE
Protein, ur: 100 mg/dL — AB
Specific Gravity, Urine: 1.028 (ref 1.005–1.030)
pH: 5 (ref 5.0–8.0)

## 2019-11-23 LAB — PROTIME-INR
INR: 1 (ref 0.8–1.2)
Prothrombin Time: 12.9 seconds (ref 11.4–15.2)

## 2019-11-23 LAB — SEDIMENTATION RATE: Sed Rate: 30 mm/hr (ref 0–30)

## 2019-11-23 LAB — TYPE AND SCREEN
ABO/RH(D): O POS
Antibody Screen: NEGATIVE

## 2019-11-23 LAB — APTT: aPTT: 29 seconds (ref 24–36)

## 2019-11-23 LAB — SURGICAL PCR SCREEN
MRSA, PCR: NEGATIVE
Staphylococcus aureus: NEGATIVE

## 2019-11-23 LAB — C-REACTIVE PROTEIN: CRP: 1.1 mg/dL — ABNORMAL HIGH (ref <1.0)

## 2019-11-25 ENCOUNTER — Encounter: Payer: Self-pay | Admitting: Orthopedic Surgery

## 2019-11-25 DIAGNOSIS — E079 Disorder of thyroid, unspecified: Secondary | ICD-10-CM | POA: Insufficient documentation

## 2019-11-25 DIAGNOSIS — G43909 Migraine, unspecified, not intractable, without status migrainosus: Secondary | ICD-10-CM | POA: Insufficient documentation

## 2019-11-25 DIAGNOSIS — N824 Other female intestinal-genital tract fistulae: Secondary | ICD-10-CM | POA: Insufficient documentation

## 2019-11-25 LAB — URINE CULTURE: Special Requests: NORMAL

## 2019-11-25 MED ORDER — TRANEXAMIC ACID-NACL 1000-0.7 MG/100ML-% IV SOLN
1000.0000 mg | INTRAVENOUS | Status: AC
  Administered 2019-11-26: 12:00:00 1000 mg via INTRAVENOUS

## 2019-11-25 NOTE — H&P (Signed)
ORTHOPAEDIC HISTORY & PHYSICAL  Progress Notes by Michelene Gardener, PA at 11/23/2019 2:30 PM  [facility] Chief Complaint:       Chief Complaint  Patient presents with  . Knee Pain    H & P LEFT KNEE    History of Present Illness:    Carrie [name] is a 74 y.o. female that presents to clinic today for her preoperative history and evaluation.  Patient presents unaccompanied. The patient is scheduled to undergo a left total knee arthroplasty on 11/26/19 by Dr. Ernest Pine. Her pain began several years ago.  The pain is located along the lateral aspect of the knee.  She reports associated swelling, no locking, and some giving way of the knee.  She states she has some decreased sensation over the soles of the foot, but this has been present for some time.    The patient's symptoms have progressed to the point that they decrease her quality of life. The patient has previously undergone conservative treatment including NSAIDS and injections to the knee without adequate control of her symptoms.  Patient denies any cardiac history, history of blood clots, or allergies. She states she does have a heart murmur.  Of note, the patient will be staying with her daughter at [address], [place], [place].  Past Medical, Surgical, Family, Social History, Allergies, Medications:   Past Medical History:      Past Medical History:  Diagnosis Date  . Arthritis    a.Hands. b.Left knee. c.Trigger nodules. d.  Ganglion.  . Cataract cortical, senile   . Chronic pain in right foot   . Colovaginal fistula   . Diverticulosis 05/30/2015  . Fibrocystic breast disease   . Hypertension   . Lichen sclerosus of female genitalia   . Migraine   . Mitral regurgitation    echo 6/13 with normal LVEF, mild MR with mild LAE, and normal right heart with normal pressures.  Marland Kitchen MRSA cellulitis    right leg; hospitalized at [facility]  . Thyroid disease     thyroid nodule    Past Surgical History:       Past Surgical History:  Procedure Laterality Date  . Benign breast tumor  1962  . COLONOSCOPY  04/01/2005   Normal Colon: CBF 04/2015  . COLONOSCOPY  05/30/2015   Diverticulosis/Otherwise normal/Repeat 32yrs/PYO  . Excision cyst left index finger   07/07/2010    extensor tendon and extensor retinacular release   . HYSTERECTOMY  1998   with BSO  . lower anterior resection of sigmoid colon  04/2014   Dr. Katrinka Blazing  . Tummy tuck  1998  . WRIST GANGLION EXCISION      Current Medications:  Current Medications        Current Outpatient Medications  Medication Sig Dispense Refill  . cyanocobalamin (VITAMIN B12) 1000 MCG tablet Take 1,000 mcg by mouth once daily       . losartan (COZAAR) 50 MG tablet TAKE 1 TABLET(50 MG) BY MOUTH EVERY DAY 90 tablet 1  . multivitamin with minerals (HAIR,SKIN AND NAILS ORAL) Take by mouth One daily    . naproxen sodium (ALEVE, ANAPROX) 220 MG tablet Take 220 mg by mouth 2 (two) times daily as needed daily      . turmeric/turmeric ext/pepr ext (TURMERIC-TURMERIC EXT-PEPPER) 500-3 mg Cap Take 1 capsule by mouth once daily       . ZOLMitriptan (ZOMIG-ZMT) 5 MG disintegrating tablet DISSOLVE 1 TABLET(5 MG) ON  THE TONGUE EVERY DAY AS NEEDED FOR MIGRAINE 10 tablet 1   No current facility-administered medications for this visit.       Allergies:       Allergies  Allergen Reactions  . Pneumovax 23 [Pneumococcal 23-Val Ps Vaccine] Rash    Severe local reaction     Social History:  Social History  Social History        Socioeconomic History  . Marital status: Divorced    Spouse name: Not on file  . Number of children: 1  . Years of education: 24  . Highest education level: Not on file  Occupational History  . Occupation: Retired- Audiological scientist, [employer]  Social Needs  . Financial resource strain: Not on file  . Food insecurity    Worry: Not on file    Inability: Not  on file  . Transportation needs    Medical: Not on file    Non-medical: Not on file  Tobacco Use  . Smoking status: Never Smoker  . Smokeless tobacco: Never Used  Substance and Sexual Activity  . Alcohol use: No  . Drug use: No  . Sexual activity: Defer    Partners: Male  Lifestyle  . Physical activity    Days per week: Not on file    Minutes per session: Not on file  . Stress: Not on file  Relationships  . Social Musician on phone: Not on file    Gets together: Not on file    Attends religious service: Not on file    Active member of club or organization: Not on file    Attends meetings of clubs or organizations: Not on file    Relationship status: Not on file  Other Topics Concern  . Not on file  Social History Narrative  . Not on file      Family History:       Family History  Problem Relation Age of Onset  . Alzheimer's disease Mother   . High blood pressure (Hypertension) Mother   . Pancreatic cancer Mother   . Coronary Artery Disease (Blocked arteries around heart) Father   . Skin cancer Sister   . Skin cancer Brother   . Pancreatic cancer Maternal Grandmother   . Pancreatic cancer Brother   . Breast cancer Paternal Aunt        x2    Review of Systems:   A 10+ ROS was performed, reviewed, and the pertinent orthopaedic findings are documented in the HPI.    Physical Examination:   BP 130/84   Ht 152.4 cm (5')   Wt 66.9 kg (147 lb 6.4 oz)   BMI 28.79 kg/m   Patient is a well-developed, well-nourished female in no acute distress. Patient has normal mood and affect. Patient is alert and oriented to person, place, and time.   HEENT: Atraumatic, normocephalic.  Pupils equal and reactive to light.  Extraocular motion intact.  Noninjected sclera.  Cardiovascular: Regular rate and rhythm, normal S1, S2, rubs, or gallops.  Distal pulses palpable. Unable to appreciate murmur on  auscultation  Respiratory: Lungs clear to auscultation bilaterally.   Left Knee: Soft tissue swelling:mild Effusion:minimal Erythema:none Crepitance:mild Tenderness:lateral Alignment:relative valgus Mediolateral laxity:lateral pseudolaxity Posterior MHD:[id number] Patellar tracking:Good tracking without evidence of subluxation or tilt Atrophy:No significantatrophy.  Quadriceps tone was fair to good. Range of motion:0/0/106degrees   Sensation intact over the saphenous, lateral sural cutaneous, superficial fibular, and deep fibular nerve distributions.  Tests Performed/Reviewed:  X-rays  No new  radiographs were obtained today. Previous radiographs were reviewed of the left knee and revealed complete loss of lateral joint space with bone-on-bone contact, subchondral sclerosis of the bone, and significant osteophyte formation.  Medial compartment reveals significant osteophyte formation.  Valgus alignment noted.  Significant posterior osteophytes appreciated.  Patellofemoral joint reveals mild loss of joint space with osteophyte formation.  No fractures noted.   Impression:     ICD-10-CM  1. Primary osteoarthritis of left knee  M17.12   Plan:   The patient has end-stage degenerative changes of the left knee.  It was explained to the patient that the condition is progressive in nature.  Having failed conservative treatment, the patient has elected to proceed with a total joint arthroplasty.  The patient will undergo a total joint arthroplasty with Dr. Marry Guan.  The risks of surgery, including blood clot and infection, were discussed with the patient.  Measures to reduce these risks, including the use of anticoagulation, perioperative antibiotics, and early ambulation were  discussed.  The importance of postoperative physical therapy was discussed with the patient. The patient elects to proceed with surgery. The patient is instructed to stop all blood thinners prior to surgery.  The patient is instructed to call the hospital the day before surgery to learn of the proper arrival time.    Contact our office with any questions or concerns.  Follow up as indicated, or sooner should any new problems arise, if conditions worsen, or if they are otherwise concerned.   Gwenlyn Fudge, PA [facility] [address] [place], [place] [address] Phone: [phone]  This note was generated in part with voice recognition software and I apologize for any typographical errors that were not detected and corrected.     Electronically signed by Gwenlyn Fudge, Anita on 11/23/2019 5:23 PM

## 2019-11-26 ENCOUNTER — Ambulatory Visit: Payer: PPO | Admitting: Registered Nurse

## 2019-11-26 ENCOUNTER — Ambulatory Visit
Admission: RE | Admit: 2019-11-26 | Discharge: 2019-11-26 | Disposition: A | Discharge status: Home / Self Care | Payer: PPO | Source: Ambulatory Visit | Attending: Orthopedic Surgery | Admitting: Orthopedic Surgery

## 2019-11-26 ENCOUNTER — Ambulatory Visit: Payer: PPO

## 2019-11-26 ENCOUNTER — Other Ambulatory Visit: Payer: Self-pay

## 2019-11-26 ENCOUNTER — Encounter
Admission: RE | Disposition: A | Discharge status: Home / Self Care | Payer: Self-pay | Source: Ambulatory Visit | Attending: Orthopedic Surgery

## 2019-11-26 ENCOUNTER — Encounter: Payer: Self-pay | Admitting: Orthopedic Surgery

## 2019-11-26 DIAGNOSIS — Z90710 Acquired absence of both cervix and uterus: Secondary | ICD-10-CM | POA: Insufficient documentation

## 2019-11-26 DIAGNOSIS — N6019 Diffuse cystic mastopathy of unspecified breast: Secondary | ICD-10-CM | POA: Insufficient documentation

## 2019-11-26 DIAGNOSIS — Z8711 Personal history of peptic ulcer disease: Secondary | ICD-10-CM | POA: Insufficient documentation

## 2019-11-26 DIAGNOSIS — Z96652 Presence of left artificial knee joint: Secondary | ICD-10-CM | POA: Diagnosis not present

## 2019-11-26 DIAGNOSIS — Z820 Family history of epilepsy and other diseases of the nervous system: Secondary | ICD-10-CM | POA: Insufficient documentation

## 2019-11-26 DIAGNOSIS — G8929 Other chronic pain: Secondary | ICD-10-CM | POA: Insufficient documentation

## 2019-11-26 DIAGNOSIS — R011 Cardiac murmur, unspecified: Secondary | ICD-10-CM | POA: Insufficient documentation

## 2019-11-26 DIAGNOSIS — Z8249 Family history of ischemic heart disease and other diseases of the circulatory system: Secondary | ICD-10-CM | POA: Insufficient documentation

## 2019-11-26 DIAGNOSIS — M170 Bilateral primary osteoarthritis of knee: Secondary | ICD-10-CM | POA: Insufficient documentation

## 2019-11-26 DIAGNOSIS — Z471 Aftercare following joint replacement surgery: Secondary | ICD-10-CM | POA: Diagnosis not present

## 2019-11-26 DIAGNOSIS — Z800 Family history of malignant neoplasm of digestive organs: Secondary | ICD-10-CM | POA: Insufficient documentation

## 2019-11-26 DIAGNOSIS — E041 Nontoxic single thyroid nodule: Secondary | ICD-10-CM | POA: Insufficient documentation

## 2019-11-26 DIAGNOSIS — Z803 Family history of malignant neoplasm of breast: Secondary | ICD-10-CM | POA: Insufficient documentation

## 2019-11-26 DIAGNOSIS — M1712 Unilateral primary osteoarthritis, left knee: Secondary | ICD-10-CM | POA: Diagnosis not present

## 2019-11-26 DIAGNOSIS — Z96651 Presence of right artificial knee joint: Secondary | ICD-10-CM

## 2019-11-26 DIAGNOSIS — Z79899 Other long term (current) drug therapy: Secondary | ICD-10-CM | POA: Insufficient documentation

## 2019-11-26 DIAGNOSIS — R519 Headache, unspecified: Secondary | ICD-10-CM | POA: Insufficient documentation

## 2019-11-26 DIAGNOSIS — K219 Gastro-esophageal reflux disease without esophagitis: Secondary | ICD-10-CM | POA: Insufficient documentation

## 2019-11-26 DIAGNOSIS — Z96659 Presence of unspecified artificial knee joint: Secondary | ICD-10-CM

## 2019-11-26 DIAGNOSIS — Z887 Allergy status to serum and vaccine status: Secondary | ICD-10-CM | POA: Insufficient documentation

## 2019-11-26 DIAGNOSIS — Z8614 Personal history of Methicillin resistant Staphylococcus aureus infection: Secondary | ICD-10-CM | POA: Insufficient documentation

## 2019-11-26 DIAGNOSIS — I10 Essential (primary) hypertension: Secondary | ICD-10-CM | POA: Insufficient documentation

## 2019-11-26 DIAGNOSIS — M79671 Pain in right foot: Secondary | ICD-10-CM | POA: Insufficient documentation

## 2019-11-26 HISTORY — PX: KNEE ARTHROPLASTY: SHX992

## 2019-11-26 LAB — ABO/RH: ABO/RH(D): O POS

## 2019-11-26 IMAGING — DX DG KNEE 1-2V PORT*L*
2 series · 2 of 2 positions shown · non-contrast
Comparison: None.

CLINICAL DATA: Status post LEFT knee replacement.

EXAM:
PORTABLE LEFT KNEE - 1-2 VIEW

[knee ap]
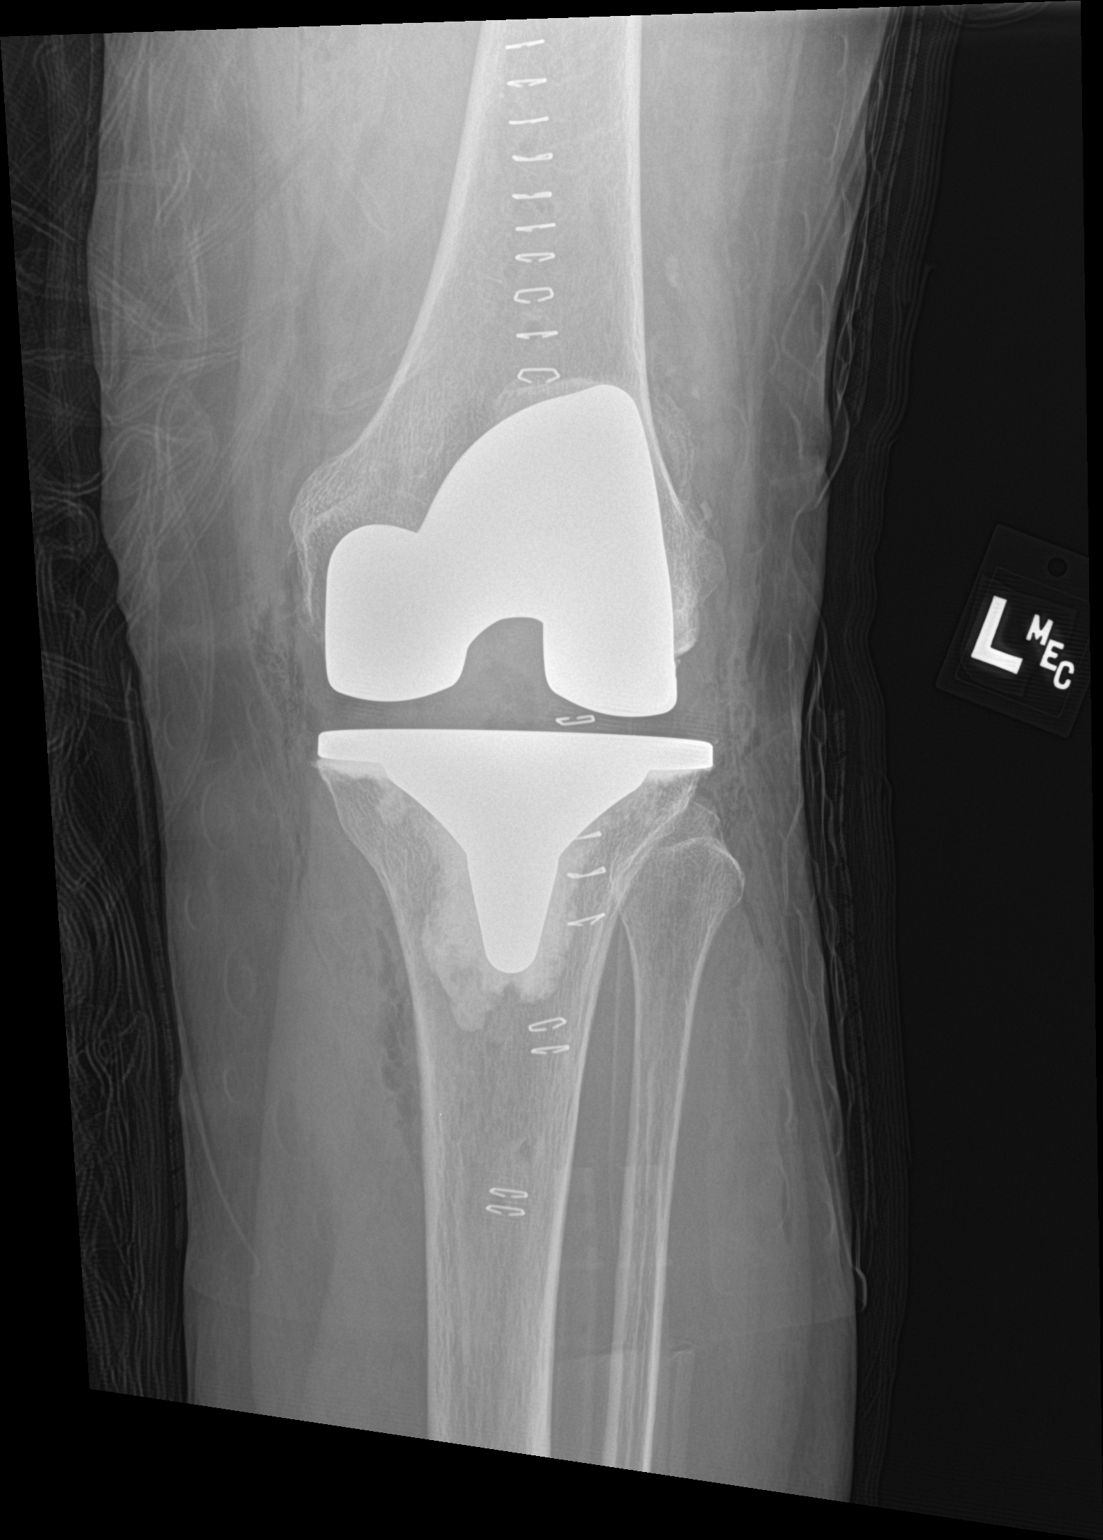

[knee lat]
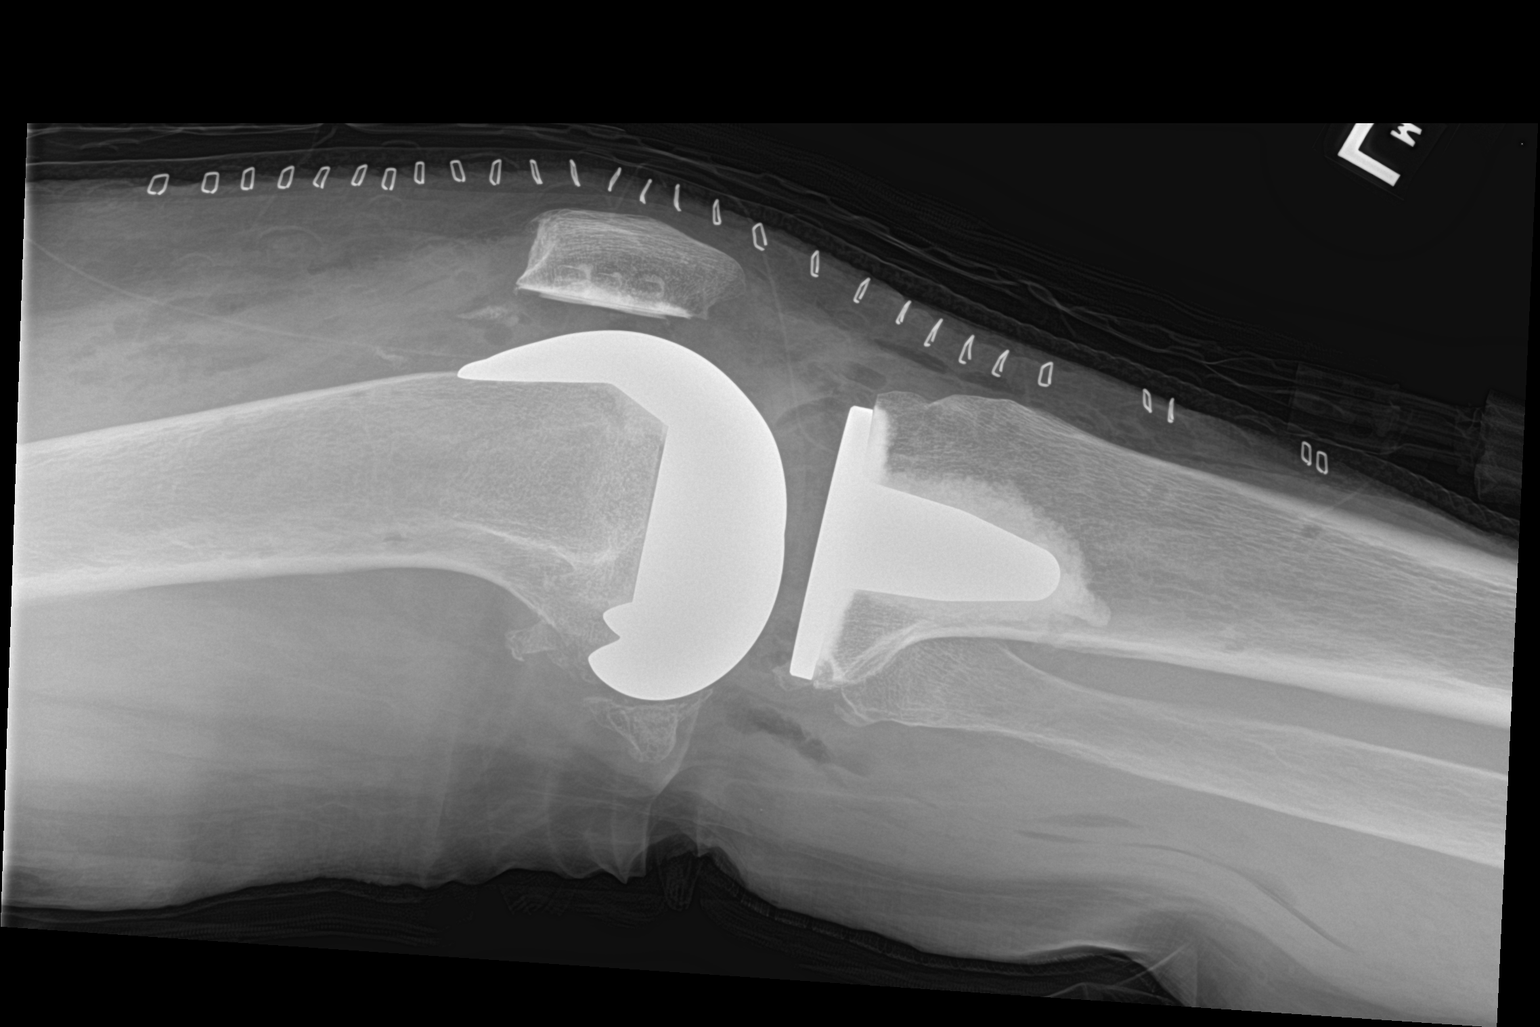

[2 of 2 positions shown; findings below may reference images not displayed]

FINDINGS: Total LEFT knee arthroplasty changes identified.

No definite complicating features are noted.

No acute fracture or dislocation.
IMPRESSION: Total LEFT knee arthroplasty without definite complicating features.

## 2019-11-26 SURGERY — ARTHROPLASTY, KNEE, TOTAL, USING IMAGELESS COMPUTER-ASSISTED NAVIGATION
Anesthesia: Spinal | Site: Knee | Laterality: Left

## 2019-11-26 MED ORDER — NEOMYCIN-POLYMYXIN B GU 40-200000 IR SOLN
Status: DC | PRN
  Administered 2019-11-26: 14 mL

## 2019-11-26 MED ORDER — DIPHENHYDRAMINE HCL 12.5 MG/5ML PO ELIX
12.5000 mg | ORAL_SOLUTION | ORAL | Status: DC | PRN
  Filled 2019-11-26: qty 10

## 2019-11-26 MED ORDER — BUPIVACAINE HCL (PF) 0.5 % IJ SOLN
INTRAMUSCULAR | Status: AC
  Filled 2019-11-26: qty 10

## 2019-11-26 MED ORDER — METOCLOPRAMIDE HCL 5 MG/ML IJ SOLN: 5.0000 mg | Freq: Three times a day (TID) | INTRAMUSCULAR | Status: DC | PRN

## 2019-11-26 MED ORDER — KETAMINE HCL 10 MG/ML IJ SOLN
INTRAMUSCULAR | Status: DC | PRN
  Administered 2019-11-26 (×3): 10 mg via INTRAVENOUS
  Administered 2019-11-26: 5 mg via INTRAVENOUS

## 2019-11-26 MED ORDER — CEFAZOLIN SODIUM-DEXTROSE 2-4 GM/100ML-% IV SOLN
2.0000 g | INTRAVENOUS | Status: AC
  Administered 2019-11-26: 2 g via INTRAVENOUS

## 2019-11-26 MED ORDER — LACTATED RINGERS IV BOLUS
500.0000 mL | Freq: Once | INTRAVENOUS | Status: AC
  Administered 2019-11-26: 500 mL via INTRAVENOUS

## 2019-11-26 MED ORDER — BUPIVACAINE HCL (PF) 0.5 % IJ SOLN
INTRAMUSCULAR | Status: DC | PRN
  Administered 2019-11-26: 3 mL via INTRATHECAL

## 2019-11-26 MED ORDER — PROPOFOL 500 MG/50ML IV EMUL
INTRAVENOUS | Status: AC
  Filled 2019-11-26: qty 50

## 2019-11-26 MED ORDER — ALUM & MAG HYDROXIDE-SIMETH 200-200-20 MG/5ML PO SUSP: 30.0000 mL | ORAL | Status: DC | PRN

## 2019-11-26 MED ORDER — ACETAMINOPHEN 10 MG/ML IV SOLN
INTRAVENOUS | Status: AC
  Filled 2019-11-26: qty 100

## 2019-11-26 MED ORDER — CELECOXIB 200 MG PO CAPS: 400.0000 mg | ORAL_CAPSULE | Freq: Once | ORAL | Status: AC

## 2019-11-26 MED ORDER — PROPOFOL 10 MG/ML IV BOLUS
INTRAVENOUS | Status: DC | PRN
  Administered 2019-11-26: 30 mg via INTRAVENOUS
  Administered 2019-11-26: 20 mg via INTRAVENOUS

## 2019-11-26 MED ORDER — ENOXAPARIN SODIUM 40 MG/0.4ML ~~LOC~~ SOLN: 40.0000 mg | SUBCUTANEOUS | 0 refills | Status: DC

## 2019-11-26 MED ORDER — ACETAMINOPHEN 10 MG/ML IV SOLN: 1000.0000 mg | Freq: Four times a day (QID) | INTRAVENOUS | Status: DC

## 2019-11-26 MED ORDER — GABAPENTIN 300 MG PO CAPS
ORAL_CAPSULE | ORAL | Status: AC
  Filled 2019-11-26: qty 1

## 2019-11-26 MED ORDER — ONDANSETRON HCL 4 MG/2ML IJ SOLN
INTRAMUSCULAR | Status: DC | PRN
  Administered 2019-11-26: 4 mg via INTRAVENOUS

## 2019-11-26 MED ORDER — GLYCOPYRROLATE 0.2 MG/ML IJ SOLN
INTRAMUSCULAR | Status: DC | PRN
  Administered 2019-11-26: .2 mg via INTRAVENOUS

## 2019-11-26 MED ORDER — BUPIVACAINE HCL (PF) 0.25 % IJ SOLN
INTRAMUSCULAR | Status: AC
  Filled 2019-11-26: qty 60

## 2019-11-26 MED ORDER — CELECOXIB 200 MG PO CAPS: 200.0000 mg | ORAL_CAPSULE | Freq: Two times a day (BID) | ORAL | Status: DC

## 2019-11-26 MED ORDER — SODIUM CHLORIDE 0.9 % IV SOLN: INTRAVENOUS | Status: DC

## 2019-11-26 MED ORDER — BUPIVACAINE LIPOSOME 1.3 % IJ SUSP
INTRAMUSCULAR | Status: AC
  Filled 2019-11-26: qty 20

## 2019-11-26 MED ORDER — METOCLOPRAMIDE HCL 10 MG PO TABS: 5.0000 mg | ORAL_TABLET | Freq: Three times a day (TID) | ORAL | Status: DC | PRN

## 2019-11-26 MED ORDER — SODIUM CHLORIDE (PF) 0.9 % IJ SOLN
INTRAMUSCULAR | Status: AC
  Filled 2019-11-26: qty 10

## 2019-11-26 MED ORDER — DEXAMETHASONE SODIUM PHOSPHATE 10 MG/ML IJ SOLN: 8.0000 mg | Freq: Once | INTRAMUSCULAR | Status: AC

## 2019-11-26 MED ORDER — SODIUM CHLORIDE FLUSH 0.9 % IV SOLN
INTRAVENOUS | Status: AC
  Filled 2019-11-26: qty 40

## 2019-11-26 MED ORDER — TRANEXAMIC ACID-NACL 1000-0.7 MG/100ML-% IV SOLN
INTRAVENOUS | Status: AC
  Administered 2019-11-26: 1000 mg via INTRAVENOUS
  Filled 2019-11-26: qty 100

## 2019-11-26 MED ORDER — DEXAMETHASONE SODIUM PHOSPHATE 10 MG/ML IJ SOLN
INTRAMUSCULAR | Status: AC
  Administered 2019-11-26: 11:00:00 8 mg via INTRAVENOUS
  Filled 2019-11-26: qty 1

## 2019-11-26 MED ORDER — ACETAMINOPHEN 10 MG/ML IV SOLN
INTRAVENOUS | Status: DC | PRN
  Administered 2019-11-26: 1000 mg via INTRAVENOUS

## 2019-11-26 MED ORDER — SODIUM CHLORIDE FLUSH 0.9 % IV SOLN
INTRAVENOUS | Status: AC
  Filled 2019-11-26: qty 10

## 2019-11-26 MED ORDER — CEFAZOLIN SODIUM-DEXTROSE 2-4 GM/100ML-% IV SOLN
2.0000 g | Freq: Four times a day (QID) | INTRAVENOUS | Status: DC
  Administered 2019-11-26: 17:00:00 2 g via INTRAVENOUS

## 2019-11-26 MED ORDER — BISACODYL 10 MG RE SUPP
10.0000 mg | Freq: Every day | RECTAL | Status: DC | PRN
  Filled 2019-11-26: qty 1

## 2019-11-26 MED ORDER — PHENOL 1.4 % MT LIQD
1.0000 | OROMUCOSAL | Status: DC | PRN
  Filled 2019-11-26: qty 177

## 2019-11-26 MED ORDER — ONDANSETRON HCL 4 MG/2ML IJ SOLN: 4.0000 mg | Freq: Once | INTRAMUSCULAR | Status: DC | PRN

## 2019-11-26 MED ORDER — CELECOXIB 200 MG PO CAPS
ORAL_CAPSULE | ORAL | Status: AC
  Administered 2019-11-26: 400 mg via ORAL
  Filled 2019-11-26: qty 2

## 2019-11-26 MED ORDER — ENOXAPARIN SODIUM 40 MG/0.4ML ~~LOC~~ SOLN: 40.0000 mg | SUBCUTANEOUS | Status: DC

## 2019-11-26 MED ORDER — OXYCODONE HCL 5 MG PO TABS
10.0000 mg | ORAL_TABLET | ORAL | Status: DC | PRN
  Filled 2019-11-26: qty 2

## 2019-11-26 MED ORDER — FAMOTIDINE 20 MG PO TABS: 20.0000 mg | ORAL_TABLET | Freq: Once | ORAL | Status: AC

## 2019-11-26 MED ORDER — CEFAZOLIN SODIUM-DEXTROSE 2-4 GM/100ML-% IV SOLN
INTRAVENOUS | Status: AC
  Filled 2019-11-26: qty 100

## 2019-11-26 MED ORDER — CELECOXIB 200 MG PO CAPS: 200.0000 mg | ORAL_CAPSULE | Freq: Two times a day (BID) | ORAL | 2 refills | Status: AC

## 2019-11-26 MED ORDER — TRAMADOL HCL 50 MG PO TABS: 50.0000 mg | ORAL_TABLET | ORAL | Status: DC | PRN

## 2019-11-26 MED ORDER — FAMOTIDINE 20 MG PO TABS
ORAL_TABLET | ORAL | Status: AC
  Administered 2019-11-26: 11:00:00 20 mg via ORAL
  Filled 2019-11-26: qty 1

## 2019-11-26 MED ORDER — PROPOFOL 500 MG/50ML IV EMUL
INTRAVENOUS | Status: DC | PRN
  Administered 2019-11-26: 75 ug/kg/min via INTRAVENOUS

## 2019-11-26 MED ORDER — ONDANSETRON HCL 4 MG/2ML IJ SOLN
INTRAMUSCULAR | Status: AC
  Filled 2019-11-26: qty 2

## 2019-11-26 MED ORDER — NEOMYCIN-POLYMYXIN B GU 40-200000 IR SOLN
Status: AC
  Filled 2019-11-26: qty 20

## 2019-11-26 MED ORDER — BUPIVACAINE HCL (PF) 0.25 % IJ SOLN
INTRAMUSCULAR | Status: DC | PRN
  Administered 2019-11-26: 60 mL

## 2019-11-26 MED ORDER — SODIUM CHLORIDE 0.9 % IV SOLN
INTRAVENOUS | Status: DC | PRN
  Administered 2019-11-26: 40 ug/min via INTRAVENOUS

## 2019-11-26 MED ORDER — LACTATED RINGERS IV SOLN: INTRAVENOUS | Status: DC

## 2019-11-26 MED ORDER — LACTATED RINGERS IV BOLUS
250.0000 mL | Freq: Once | INTRAVENOUS | Status: AC
  Administered 2019-11-26: 250 mL via INTRAVENOUS

## 2019-11-26 MED ORDER — CHLORHEXIDINE GLUCONATE 4 % EX LIQD: 60.0000 mL | Freq: Once | CUTANEOUS | Status: DC

## 2019-11-26 MED ORDER — ENSURE PRE-SURGERY PO LIQD
296.0000 mL | Freq: Once | ORAL | Status: DC
  Filled 2019-11-26: qty 296

## 2019-11-26 MED ORDER — TRAMADOL HCL 50 MG PO TABS: 50.0000 mg | ORAL_TABLET | Freq: Four times a day (QID) | ORAL | 0 refills | Status: DC | PRN

## 2019-11-26 MED ORDER — PHENYLEPHRINE HCL (PRESSORS) 10 MG/ML IV SOLN
INTRAVENOUS | Status: DC | PRN
  Administered 2019-11-26: 50 ug via INTRAVENOUS
  Administered 2019-11-26 (×2): 100 ug via INTRAVENOUS
  Administered 2019-11-26: 50 ug via INTRAVENOUS
  Administered 2019-11-26: 100 ug via INTRAVENOUS

## 2019-11-26 MED ORDER — METOCLOPRAMIDE HCL 10 MG PO TABS: 10.0000 mg | ORAL_TABLET | Freq: Three times a day (TID) | ORAL | Status: DC

## 2019-11-26 MED ORDER — MENTHOL 3 MG MT LOZG
1.0000 | LOZENGE | OROMUCOSAL | Status: DC | PRN
  Filled 2019-11-26: qty 9

## 2019-11-26 MED ORDER — TRANEXAMIC ACID-NACL 1000-0.7 MG/100ML-% IV SOLN
INTRAVENOUS | Status: AC
  Filled 2019-11-26: qty 100

## 2019-11-26 MED ORDER — MAGNESIUM HYDROXIDE 400 MG/5ML PO SUSP: 30.0000 mL | Freq: Every day | ORAL | Status: DC

## 2019-11-26 MED ORDER — OXYCODONE HCL 5 MG PO TABS
5.0000 mg | ORAL_TABLET | ORAL | Status: DC | PRN
  Administered 2019-11-26: 5 mg via ORAL
  Filled 2019-11-26 (×2): qty 1

## 2019-11-26 MED ORDER — HYDROMORPHONE HCL 1 MG/ML IJ SOLN: 0.5000 mg | INTRAMUSCULAR | Status: DC | PRN

## 2019-11-26 MED ORDER — ACETAMINOPHEN 325 MG PO TABS: 325.0000 mg | ORAL_TABLET | Freq: Four times a day (QID) | ORAL | Status: DC | PRN

## 2019-11-26 MED ORDER — PHENYLEPHRINE HCL (PRESSORS) 10 MG/ML IV SOLN
INTRAVENOUS | Status: AC
  Filled 2019-11-26: qty 1

## 2019-11-26 MED ORDER — PANTOPRAZOLE SODIUM 40 MG PO TBEC: 40.0000 mg | DELAYED_RELEASE_TABLET | Freq: Two times a day (BID) | ORAL | Status: DC

## 2019-11-26 MED ORDER — SENNOSIDES-DOCUSATE SODIUM 8.6-50 MG PO TABS: 1.0000 | ORAL_TABLET | Freq: Two times a day (BID) | ORAL | Status: DC

## 2019-11-26 MED ORDER — FENTANYL CITRATE (PF) 100 MCG/2ML IJ SOLN: 25.0000 ug | INTRAMUSCULAR | Status: DC | PRN

## 2019-11-26 MED ORDER — GABAPENTIN 300 MG PO CAPS
300.0000 mg | ORAL_CAPSULE | Freq: Once | ORAL | Status: AC
  Administered 2019-11-26: 300 mg via ORAL

## 2019-11-26 MED ORDER — TRANEXAMIC ACID-NACL 1000-0.7 MG/100ML-% IV SOLN: 1000.0000 mg | Freq: Once | INTRAVENOUS | Status: AC

## 2019-11-26 MED ORDER — FERROUS SULFATE 325 (65 FE) MG PO TABS: 325.0000 mg | ORAL_TABLET | Freq: Two times a day (BID) | ORAL | Status: DC

## 2019-11-26 MED ORDER — ONDANSETRON HCL 4 MG PO TABS: 4.0000 mg | ORAL_TABLET | Freq: Four times a day (QID) | ORAL | Status: DC | PRN

## 2019-11-26 MED ORDER — DEXMEDETOMIDINE HCL 200 MCG/2ML IV SOLN
INTRAVENOUS | Status: DC | PRN
  Administered 2019-11-26: 8 ug via INTRAVENOUS

## 2019-11-26 MED ORDER — OXYCODONE HCL 5 MG PO TABS: 5.0000 mg | ORAL_TABLET | ORAL | 0 refills | Status: DC | PRN

## 2019-11-26 MED ORDER — ONDANSETRON HCL 4 MG/2ML IJ SOLN: 4.0000 mg | Freq: Four times a day (QID) | INTRAMUSCULAR | Status: DC | PRN

## 2019-11-26 MED ORDER — SODIUM CHLORIDE 0.9 % IV SOLN
INTRAVENOUS | Status: DC | PRN
  Administered 2019-11-26: 60 mL

## 2019-11-26 MED ORDER — OXYCODONE HCL 5 MG PO TABS
ORAL_TABLET | ORAL | Status: AC
  Filled 2019-11-26: qty 1

## 2019-11-26 MED ORDER — FLEET ENEMA 7-19 GM/118ML RE ENEM: 1.0000 | ENEMA | Freq: Once | RECTAL | Status: DC | PRN

## 2019-11-26 SURGICAL SUPPLY — 79 items
ATTUNE PSFEM LTSZ6 NARCEM KNEE (Femur) ×2 IMPLANT
ATTUNE PSRP INSR SZ6 5 KNEE (Insert) ×1 IMPLANT
ATTUNE PSRP INSR SZ6 5MM KNEE (Insert) ×1 IMPLANT
BASE TIBIA ATTUNE KNEE SYS SZ6 (Knees) IMPLANT
BATTERY INSTRU NAVIGATION (MISCELLANEOUS) ×12 IMPLANT
BLADE SAW 70X12.5 (BLADE) ×3 IMPLANT
BLADE SAW 90X13X1.19 OSCILLAT (BLADE) ×3 IMPLANT
BLADE SAW 90X25X1.19 OSCILLAT (BLADE) ×3 IMPLANT
BONE CEMENT GENTAMICIN (Cement) ×6 IMPLANT
BSPLAT TIB 6 CMNT ROT PLAT STR (Knees) ×1 IMPLANT
BTRY SRG DRVR LF (MISCELLANEOUS) ×4
CANISTER SUCT 3000ML PPV (MISCELLANEOUS) ×3 IMPLANT
CEMENT BONE GENTAMICIN 40 (Cement) IMPLANT
COOLER POLAR GLACIER W/PUMP (MISCELLANEOUS) ×3 IMPLANT
COVER WAND RF STERILE (DRAPES) ×3 IMPLANT
CUFF TOURN SGL QUICK 24 (TOURNIQUET CUFF) ×3
CUFF TOURN SGL QUICK 30 (TOURNIQUET CUFF)
CUFF TRNQT CYL 24X4X16.5-23 (TOURNIQUET CUFF) IMPLANT
CUFF TRNQT CYL 30X4X21-28X (TOURNIQUET CUFF) IMPLANT
DRAPE 3/4 80X56 (DRAPES) ×3 IMPLANT
DRSG AQUACEL AG ADV 3.5X14 (GAUZE/BANDAGES/DRESSINGS) ×2 IMPLANT
DRSG DERMACEA 8X12 NADH (GAUZE/BANDAGES/DRESSINGS) ×1 IMPLANT
DRSG OPSITE POSTOP 4X14 (GAUZE/BANDAGES/DRESSINGS) ×1 IMPLANT
DRSG TEGADERM 4X4.75 (GAUZE/BANDAGES/DRESSINGS) ×3 IMPLANT
DURAPREP 26ML APPLICATOR (WOUND CARE) ×4 IMPLANT
ELECT REM PT RETURN 9FT ADLT (ELECTROSURGICAL) ×3
ELECTRODE REM PT RTRN 9FT ADLT (ELECTROSURGICAL) ×1 IMPLANT
EX-PIN ORTHOLOCK NAV 4X150 (PIN) ×6 IMPLANT
GLOVE BIO SURGEON STRL SZ7.5 (GLOVE) ×6 IMPLANT
GLOVE BIOGEL M STRL SZ7.5 (GLOVE) ×6 IMPLANT
GLOVE BIOGEL PI IND STRL 7.5 (GLOVE) ×1 IMPLANT
GLOVE BIOGEL PI INDICATOR 7.5 (GLOVE) ×2
GLOVE INDICATOR 8.0 STRL GRN (GLOVE) ×3 IMPLANT
GOWN STRL REUS W/ TWL LRG LVL3 (GOWN DISPOSABLE) ×2 IMPLANT
GOWN STRL REUS W/ TWL XL LVL3 (GOWN DISPOSABLE) ×1 IMPLANT
GOWN STRL REUS W/TWL LRG LVL3 (GOWN DISPOSABLE) ×6
GOWN STRL REUS W/TWL XL LVL3 (GOWN DISPOSABLE) ×3
HEMOVAC 400CC 10FR (MISCELLANEOUS) ×1 IMPLANT
HOLDER FOLEY CATH W/STRAP (MISCELLANEOUS) ×3 IMPLANT
HOOD PEEL AWAY FLYTE STAYCOOL (MISCELLANEOUS) ×6 IMPLANT
KIT TURNOVER KIT A (KITS) ×3 IMPLANT
KNIFE SCULPS 14X20 (INSTRUMENTS) ×3 IMPLANT
LABEL OR SOLS (LABEL) ×3 IMPLANT
MANIFOLD NEPTUNE II (INSTRUMENTS) ×3 IMPLANT
NDL SAFETY ECLIPSE 18X1.5 (NEEDLE) ×1 IMPLANT
NDL SPNL 20GX3.5 QUINCKE YW (NEEDLE) ×2 IMPLANT
NEEDLE HYPO 18GX1.5 SHARP (NEEDLE) ×3
NEEDLE SPNL 20GX3.5 QUINCKE YW (NEEDLE) ×6 IMPLANT
NS IRRIG 500ML POUR BTL (IV SOLUTION) ×3 IMPLANT
PACK TOTAL KNEE (MISCELLANEOUS) ×3 IMPLANT
PAD WRAPON POLAR KNEE (MISCELLANEOUS) ×1 IMPLANT
PATELLA MEDIAL ATTUN 35MM KNEE (Knees) ×2 IMPLANT
PENCIL SMOKE EVACUATOR COATED (MISCELLANEOUS) ×3 IMPLANT
PENCIL SMOKE ULTRAEVAC 22 CON (MISCELLANEOUS) ×3 IMPLANT
PIN DRILL QUICK PACK ×3 IMPLANT
PIN FIXATION 1/8DIA X 3INL (PIN) ×9 IMPLANT
PULSAVAC PLUS IRRIG FAN TIP (DISPOSABLE) ×3
SOL .9 NS 3000ML IRR  AL (IV SOLUTION) ×2
SOL .9 NS 3000ML IRR AL (IV SOLUTION) ×1
SOL .9 NS 3000ML IRR UROMATIC (IV SOLUTION) ×1 IMPLANT
SOL PREP PVP 2OZ (MISCELLANEOUS) ×3
SOLUTION PREP PVP 2OZ (MISCELLANEOUS) ×1 IMPLANT
SPONGE DRAIN TRACH 4X4 STRL 2S (GAUZE/BANDAGES/DRESSINGS) ×3 IMPLANT
STAPLER SKIN PROX 35W (STAPLE) ×3 IMPLANT
STOCKINETTE IMPERV 14X48 (MISCELLANEOUS) IMPLANT
STRAP TIBIA SHORT (MISCELLANEOUS) ×3 IMPLANT
SUCTION FRAZIER HANDLE 10FR (MISCELLANEOUS) ×2
SUCTION TUBE FRAZIER 10FR DISP (MISCELLANEOUS) ×1 IMPLANT
SUT VIC AB 0 CT1 36 (SUTURE) ×6 IMPLANT
SUT VIC AB 1 CT1 36 (SUTURE) ×6 IMPLANT
SUT VIC AB 2-0 CT2 27 (SUTURE) ×3 IMPLANT
SYR 20ML LL LF (SYRINGE) ×3 IMPLANT
SYR 30ML LL (SYRINGE) ×6 IMPLANT
TIBIA ATTUNE KNEE SYS BASE SZ6 (Knees) ×3 IMPLANT
TIP FAN IRRIG PULSAVAC PLUS (DISPOSABLE) ×1 IMPLANT
TOWEL OR 17X26 4PK STRL BLUE (TOWEL DISPOSABLE) ×3 IMPLANT
TOWER CARTRIDGE SMART MIX (DISPOSABLE) ×3 IMPLANT
TRAY FOLEY MTR SLVR 16FR STAT (SET/KITS/TRAYS/PACK) ×3 IMPLANT
WRAPON POLAR PAD KNEE (MISCELLANEOUS) ×3

## 2019-11-26 NOTE — Discharge Instructions (Addendum)
Oxycodone 5mg  taken November 26, 2019 at 4:40pm    Instructions after Total Knee Replacement   Carrie [name]., M.D.     Dept. of [facility]  [address] [place], [place]  [address]  Phone: [phone]   Fax: [phone]    DIET: . Drink plenty of non-alcoholic fluids. . Resume your normal diet. Include foods high in fiber.  ACTIVITY:  . You may use crutches or a walker with weight-bearing as tolerated, unless instructed otherwise. . You may be weaned off of the walker or crutches by your Physical Therapist.  . Do NOT place pillows under the knee. Anything placed under the knee could limit your ability to straighten the knee.   . Continue doing gentle exercises. Exercising will reduce the pain and swelling, increase motion, and prevent muscle weakness.   . Please continue to use the TED compression stockings for 6 weeks. You may remove the stockings at night, but should reapply them in the morning. . Do not drive or operate any equipment until instructed.  WOUND CARE:  . Continue to use the PolarCare or ice packs periodically to reduce pain and swelling. . You may bathe or shower after the staples are removed at the first office visit following surgery.  MEDICATIONS: . Carrie [name] may resume your regular medications. . Please take the pain medication as prescribed on the medication. . Do not take pain medication on an empty stomach. . You have been given a prescription for a blood thinner (Lovenox or Coumadin). Please take the medication as instructed. (NOTE: After completing a 2 week course of Lovenox, take one Enteric-coated aspirin once a day. This along with elevation will help reduce the possibility of phlebitis in your operated leg.) . Do not drive or drink alcoholic beverages when taking pain medications.  CALL THE OFFICE FOR: . Temperature above 101 degrees . Excessive bleeding or drainage on the dressing. . Excessive swelling,  coldness, or paleness of the toes. . Persistent nausea and vomiting.  FOLLOW-UP:  . You should have an appointment to return to the office in 10-14 days after surgery. . Arrangements have been made for continuation of Physical Therapy (either home therapy or outpatient therapy).      [facility] Department Directory         [email or web address]       [email or web address]           Cardiology  Appointments: [place] [phone]  Endocrinology  Appointments: [place] [phone] [place] [phone]  Gastroenterology  Appointments: [place] [phone] [place] [phone]        General Surgery   Appointments: [facility]  Internal Medicine/Family Medicine  Appointments: [facility] [place] - [phone] [place] [phone]  Metabolic and [place] Loss Surgery  Appointments: [facility]        Neurology  Appointments: [place] [phone] [place] - [phone]  Neurosurgery  Appointments: [place]  Obstetrics & Gynecology  Appointments: [place] [phone] [place] - [phone]        Pediatrics  Appointments: [place] [phone] [place]  Appointments: [place] [phone]  Physical Therapy  Appointments: [place] - [phone]        Podiatry  Appointments: [place] [phone] [place] - [phone]  Pulmonology  Appointments: [place]  Rheumatology  Appointments: [place] [phone]          [place]  INSTRUCTIONS  1) The drugs that you were given will stay in your system until tomorrow so for the next 24 hours you should not: A) Drive an automobile B) Make any legal decisions C) Drink any alcoholic beverage  2) You may resume regular meals tomorrow.  Today it is better to start  with liquids and gradually work up to solid foods. You may eat anything you prefer, but it is better to start with liquids, then soup and crackers, and gradually work up to solid foods.  3) Please notify your doctor immediately if you have any unusual bleeding, trouble breathing, redness and pain at the surgery site, drainage, fever, or pain not relieved by medication.  4) Additional Instructions:  Please contact your physician with any problems or Same Day Surgery at [phone], Monday through Friday 6 am to 4 pm, or [place] at [facility] number at [phone].

## 2019-11-26 NOTE — Anesthesia Preprocedure Evaluation (Signed)
Anesthesia Evaluation  Patient identified by MRN, date of birth, ID band Patient awake    Reviewed: Allergy & Precautions, NPO status , Patient's Chart, lab work & pertinent test results  History of Anesthesia Complications (+) PONV and history of anesthetic complications  Airway Mallampati: II  TM Distance: >3 FB Neck ROM: Full    Dental no notable dental hx.    Pulmonary neg pulmonary ROS, neg sleep apnea, neg COPD,    breath sounds clear to auscultation- rhonchi (-) wheezing      Cardiovascular hypertension, Pt. on medications (-) CAD, (-) Past MI, (-) Cardiac Stents and (-) CABG  Rhythm:Regular Rate:Normal - Systolic murmurs and - Diastolic murmurs    Neuro/Psych  Headaches, neg Seizures negative psych ROS   GI/Hepatic Neg liver ROS, GERD  ,  Endo/Other  negative endocrine ROSneg diabetes  Renal/GU negative Renal ROS     Musculoskeletal  (+) Arthritis ,   Abdominal (+) - obese,   Peds  Hematology negative hematology ROS (+)   Anesthesia Other Findings Past Medical History: No date: Arthritis No date: Cataract cortical, senile No date: Cellulitis due to MRSA     Comment:  right leg (hospitalized [facility]) No date: Chronic pain in right foot 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 No date: Diverticulosis No date: Family history of adverse reaction to anesthesia No date: Fibrocystic breast No date: GERD (gastroesophageal reflux disease) No date: Heart murmur     Comment:  mild 1998: History of blood transfusion     Comment:  slight rash with No date: History of hypertension     Comment:  off all meds for last 4 years No date: History of stomach ulcers No date: Hypertension No date: Lichen sclerosus of female genitalia No date: Migraines No date: PONV (postoperative nausea and vomiting)     Comment:  just once  No date: Thyroid nodule   Reproductive/Obstetrics                              PLT count done at [facility] 11/14/19 results in care everywhere: 179  Anesthesia Physical Anesthesia Plan  ASA: II  Anesthesia Plan: Spinal   Post-op Pain Management:    Induction:   PONV Risk Score and Plan: 3 and Propofol infusion  Airway Management Planned: Natural Airway  Additional Equipment:   Intra-op Plan:   Post-operative Plan:   Informed Consent: I have reviewed the patients History and Physical, chart, labs and discussed the procedure including the risks, benefits and alternatives for the proposed anesthesia with the patient or authorized representative who has indicated his/her understanding and acceptance.     Dental advisory given  Plan Discussed with: CRNA and Anesthesiologist  Anesthesia Plan Comments:         Anesthesia Quick Evaluation

## 2019-11-26 NOTE — Transfer of Care (Signed)
Immediate Anesthesia Transfer of Care Note  Patient: Carrie [name]  Procedure(s) Performed: Procedure(s): COMPUTER ASSISTED TOTAL KNEE ARTHROPLASTY (Left)  Patient Location: PACU  Anesthesia Type:General  Level of Consciousness: sedated  Airway & Oxygen Therapy: Patient Spontanous Breathing and Patient connected to face mask oxygen  Post-op Assessment: Report given to RN and Post -op Vital signs reviewed and stable  Post vital signs: Reviewed and stable  Last Vitals:  Vitals:   11/26/19 1056 11/26/19 1522  BP: 138/71 (!) 111/45  Pulse: 84 75  Resp: 16 12  Temp: 36.6 C   SpO2: 99% 100%    Complications: No apparent anesthesia complications

## 2019-11-26 NOTE — Evaluation (Signed)
Occupational Therapy Evaluation Patient Details Name: ILYSSA [name] MRN: [id number] DOB: 02-05-1946 Today's Date: 11/26/2019    History of Present Illness 74yo female POD0 s/p L TKA, WBAT.   Clinical Impression   Pt seen for OT evaluation this date, POD#0 from above surgery. 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 Pt is eager to return to PLOF with less pain and improved safety and independence. Pt currently requires PRN minimal assist for LB dressing and bathing while in seated position due to pain and limited AROM of L knee. Pt instructed in polar care mgt, falls prevention strategies, pet care considerations, home/routines modifications, DME/AE for LB bathing and dressing tasks, and compression stocking mgt. Pt verbalized understanding. Pending further mobility assessment with PT once ready per RN. Do not currently anticipate any additional skilled OT needs following this hospitalization. Will sign off.    Follow Up Recommendations  No OT follow up    Equipment Recommendations  Other (comment)(reacher)    Recommendations for Other Services PT consult     Precautions / Restrictions Precautions Precautions: Fall Precaution Comments: no KI required, pt able to perform SLR independently Restrictions Weight Bearing Restrictions: Yes LLE Weight Bearing: Weight bearing as tolerated      Mobility Bed Mobility               General bed mobility comments: deferred, pending upcoming PT evaluation  Transfers                 General transfer comment: deferred, pending upcoming PT evaluation    Balance                                           ADL either performed or assessed with clinical judgement   ADL Overall ADL's : Needs assistance/impaired                                       General ADL Comments: PRN Min A for LB ADL tasks, children able to provide as needed      Vision Baseline Vision/History: Wears glasses Patient Visual Report: No change from baseline       Perception     Praxis      Pertinent Vitals/Pain Pain Assessment: No/denies pain     Hand Dominance Right   Extremity/Trunk Assessment Upper Extremity Assessment Upper Extremity Assessment: Overall WFL for tasks assessed   Lower Extremity Assessment Lower Extremity Assessment: Defer to PT evaluation;LLE deficits/detail LLE Deficits / Details: expected post-op strength/ROM deficits, denies sensory deficits in L foot   Cervical / Trunk Assessment Cervical / Trunk Assessment: Normal   Communication Communication Communication: No difficulties   Cognition Arousal/Alertness: Awake/alert Behavior During Therapy: WFL for tasks assessed/performed Overall Cognitive Status: Within Functional Limits for tasks assessed                                     General Comments       Exercises Other Exercises Other Exercises: Pt instructed in falls prevention, pet care considerations, AE/DME for ADL, home/routines modifications, optimally safe set up for showering, compression stocking mgt, and polar care mgt; handout provided to support recall and carryover  Shoulder Instructions      Home Living Family/patient expects to be discharged to:: Private residence Living Arrangements: Children(daughter and daughter in law) Available Help at Discharge: Family;Available 24 hours/day Type of Home: House Home Access: Ramped entrance     Home Layout: One level     Bathroom Shower/Tub: Tub/shower unit;Walk-in shower   Bathroom Toilet: Standard     Home Equipment: Environmental consultant - 2 wheels;Bedside commode;Hand held shower head          Prior Functioning/Environment Level of Independence: Independent        Comments: Pt ambulated without AD, was teaching exercise classes until recently (closed), driving, 1 fall in past 12 months        OT Problem List: Decreased  strength;Decreased range of motion;Decreased knowledge of use of DME or AE      OT Treatment/Interventions:      OT Goals(Current goals can be found in the care plan section) Acute Rehab OT Goals Patient Stated Goal: go home and return to PLOF OT Goal Formulation: All assessment and education complete, DC therapy  OT Frequency:     Barriers to D/C:            Co-evaluation              AM-PAC OT "6 Clicks" Daily Activity     Outcome Measure Help from another person eating meals?: None Help from another person taking care of personal grooming?: None Help from another person toileting, which includes using toliet, bedpan, or urinal?: A Little Help from another person bathing (including washing, rinsing, drying)?: A Little Help from another person to put on and taking off regular upper body clothing?: None Help from another person to put on and taking off regular lower body clothing?: A Little 6 Click Score: 21   End of Session    Activity Tolerance: Patient tolerated treatment well Patient left: in bed;with call bell/phone within reach;with nursing/sitter in room;with SCD's reapplied;Other (comment)(polar care in place)  OT Visit Diagnosis: Other abnormalities of gait and mobility (R26.89)                Time: 3149-7026 OT Time Calculation (min): 14 min Charges:  OT General Charges $OT Visit: 1 Visit OT Evaluation $OT Eval Low Complexity: 1 Low OT Treatments $Self Care/Home Management : 8-22 mins  Richrd Prime, MPH, MS, OTR/L ascom [phone] 11/26/19, 4:43 PM

## 2019-11-26 NOTE — H&P (Signed)
The patient has been re-examined, and the chart reviewed, and there have been no interval changes to the documented history and physical.    The risks, benefits, and alternatives have been discussed at length. The patient expressed understanding of the risks benefits and agreed with plans for surgical intervention.  Shaylie Eklund P. Knut Rondinelli, Jr. M.D.    

## 2019-11-26 NOTE — Anesthesia Procedure Notes (Signed)
Spinal  Patient location during procedure: OR Start time: 11/26/2019 11:32 AM End time: 11/26/2019 11:36 AM Staffing Performed: resident/CRNA  Anesthesiologist: Alver Fisher, MD Resident/CRNA: Lynden Oxford, CRNA Preanesthetic Checklist Completed: patient identified, IV checked, site marked, risks and benefits discussed, surgical consent, monitors and equipment checked, pre-op evaluation and timeout performed Spinal Block Patient position: sitting Prep: ChloraPrep Patient monitoring: heart rate, continuous pulse ox and blood pressure Approach: midline Location: L3-4 Injection technique: single-shot Needle Needle type: Introducer and Pencil-Tip  Needle gauge: 24 G Needle length: 9 cm Assessment Sensory level: T4

## 2019-11-26 NOTE — OR Nursing (Signed)
PT and OT evals complete.   Dr. Ernest Pine in to see pt during phase II.

## 2019-11-26 NOTE — Op Note (Signed)
OPERATIVE NOTE  DATE OF SURGERY:  11/26/2019  PATIENT NAME:  Carrie [name]   DOB: 12-Aug-1946  MRN: [id number]  PRE-OPERATIVE DIAGNOSIS: Degenerative arthrosis of the left knee, primary  POST-OPERATIVE DIAGNOSIS:  Same  PROCEDURE:  Left total knee arthroplasty using computer-assisted navigation  SURGEON:  Marciano Sequin. M.D.  ASSISTANT: Cassell Smiles, PA-C (present and scrubbed throughout the case, critical for assistance with exposure, retraction, instrumentation, and closure)  ANESTHESIA: spinal  ESTIMATED BLOOD LOSS: 25mL  FLUIDS REPLACED: 900 mL of crystalloid  TOURNIQUET TIME: 98 minutes  DRAINS: 2 medium Hemovac drains  SOFT TISSUE RELEASES: Anterior cruciate ligament, posterior cruciate ligament, deep medial collateral ligament, patellofemoral ligament, and posterolateral corner  IMPLANTS UTILIZED: DePuy Attune size 6 posterior stabilized femoral component (cemented), size 6 rotating platform tibial component (cemented), 35 mm medialized dome patella (cemented), and a 5 mm stabilized rotating platform polyethylene insert.  INDICATIONS FOR SURGERY: Carrie [name] is a 74 y.o. year old female with a long history of progressive knee pain. X-rays demonstrated severe degenerative changes in tricompartmental fashion. The patient had not seen any significant improvement despite conservative nonsurgical intervention. After discussion of the risks and benefits of surgical intervention, the patient expressed understanding of the risks benefits and agree with plans for total knee arthroplasty.   The risks, benefits, and alternatives were discussed at length including but not limited to the risks of infection, bleeding, nerve injury, stiffness, blood clots, the need for revision surgery, cardiopulmonary complications, among others, and they were willing to proceed.  PROCEDURE IN DETAIL: The patient was brought into the operating room and, after adequate spinal anesthesia was achieved, a  tourniquet was placed on the patient's upper thigh. The patient's knee and leg were cleaned and prepped with alcohol and DuraPrep and draped in the usual sterile fashion. A "timeout" was performed as per usual protocol. The lower extremity was exsanguinated using an Esmarch, and the tourniquet was inflated to 300 mmHg. An anterior longitudinal incision was made followed by a standard mid vastus approach. The deep fibers of the medial collateral ligament were elevated in a subperiosteal fashion off of the medial flare of the tibia so as to maintain a continuous soft tissue sleeve. The patella was subluxed laterally and the patellofemoral ligament was incised. Inspection of the knee demonstrated severe degenerative changes with full-thickness loss of articular cartilage. Osteophytes were debrided using a rongeur. Anterior and posterior cruciate ligaments were excised. Two 4.0 mm Schanz pins were inserted in the femur and into the tibia for attachment of the array of trackers used for computer-assisted navigation. Hip center was identified using a circumduction technique. Distal landmarks were mapped using the computer. The distal femur and proximal tibia were mapped using the computer. The distal femoral cutting guide was positioned using computer-assisted navigation so as to achieve a 5 distal valgus cut. The femur was sized and it was felt that a size 6 femoral component was appropriate. A size 6 femoral cutting guide was positioned and the anterior cut was performed and verified using the computer. This was followed by completion of the posterior and chamfer cuts. Femoral cutting guide for the central box was then positioned in the center box cut was performed.  Attention was then directed to the proximal tibia. Medial and lateral menisci were excised. The extramedullary tibial cutting guide was positioned using computer-assisted navigation so as to achieve a 0 varus-valgus alignment and 3 posterior slope. The  cut was performed and verified using the computer. The proximal  tibia was sized and it was felt that a size 6 tibial tray was appropriate. Tibial and femoral trials were inserted followed by insertion of a 5 mm polyethylene insert. The knee was felt to be tight laterally.  The trial components were removed and the knee was brought into full extension and distracted using the Moreland retractors.  The posterolateral corner was carefully released using a combination of electrocautery and Metzenbaum scissors.  Trial components were reinserted followed by placement of a 5 mm polyethylene trial.  This allowed for excellent mediolateral soft tissue balancing both in flexion and in full extension. Finally, the patella was cut and prepared so as to accommodate a 35 mm medialized dome patella. A patella trial was placed and the knee was placed through a range of motion with excellent patellar tracking appreciated. The femoral trial was removed after debridement of posterior osteophytes. The central post-hole for the tibial component was reamed followed by insertion of a keel punch. Tibial trials were then removed. Cut surfaces of bone were irrigated with copious amounts of normal saline with antibiotic solution using pulsatile lavage and then suctioned dry. Polymethylmethacrylate cement with gentamicin was prepared in the usual fashion using a vacuum mixer. Cement was applied to the cut surface of the proximal tibia as well as along the undersurface of a size 6 rotating platform tibial component. Tibial component was positioned and impacted into place. Excess cement was removed using Personal assistant. Cement was then applied to the cut surfaces of the femur as well as along the posterior flanges of the size 6 femoral component. The femoral component was positioned and impacted into place. Excess cement was removed using Personal assistant. A 5 mm polyethylene trial was inserted and the knee was brought into full extension with  steady axial compression applied. Finally, cement was applied to the backside of a 35 mm medialized dome patella and the patellar component was positioned and patellar clamp applied. Excess cement was removed using Personal assistant. After adequate curing of the cement, the tourniquet was deflated after a total tourniquet time of 98 minutes. Hemostasis was achieved using electrocautery. The knee was irrigated with copious amounts of normal saline with antibiotic solution using pulsatile lavage and then suctioned dry. 20 mL of 1.3% Exparel and 60 mL of 0.25% Marcaine in 40 mL of normal saline was injected along the posterior capsule, medial and lateral gutters, and along the arthrotomy site. A 5 mm stabilized rotating platform polyethylene insert was inserted and the knee was placed through a range of motion with excellent mediolateral soft tissue balancing appreciated and excellent patellar tracking noted. 2 medium drains were placed in the wound bed and brought out through separate stab incisions. The medial parapatellar portion of the incision was reapproximated using interrupted sutures of #1 Vicryl. Subcutaneous tissue was approximated in layers using first #0 Vicryl followed #2-0 Vicryl. The skin was approximated with skin staples. A sterile dressing was applied.  The patient tolerated the procedure well and was transported to the recovery room in stable condition.    Tabari Volkert P. Angie Fava., M.D.

## 2019-11-26 NOTE — Evaluation (Signed)
Physical Therapy Evaluation Patient Details Name: Carrie [name] MRN: [id number] DOB: [birth year] Today's Date: 11/26/2019   History of Present Illness  74yo female POD0 s/p L TKA, WBAT.  Clinical Impression  Pt is a 74 yo female s/p L TKA. Pt reports that she will be staying with her daughter in a one level home with a ramped entrance. Pt already has necessary DME of RW and BSC. Pt reporting mild pain throughout session. Pt able to perform SLR independently. Pt educated on HEP and demo good performance of exercises with handout provided. Pt mod I to get EOB. Pt required min guard assist for transfers, gait and stair training. Pt progressed to ambulating with step through gait pattern with RW with steady gait. Pt educated on stair training techniques for improved community ambulation since pt has ramp at home. Pt ascended/descended 2 steps safely with min guard assist. No buckling or LOB noted during ambulation or stairs. Pt and daughter educated on car transfer with demo from PT. Pt educated on polar care use and resting with L knee in extended position. Pt encouraged to gradually progress mobility while pacing herself and listening to her body resting as appropriate. Pt presents with decreased ROM (-3-50), strength, balance and activity tolerance consistent with recent surgery. All of pt and daughters questions answered. Pt reports feeling comfortable returning home and managing ADLs. Recommendation for HHPT to further improve deficits and allow for return to PLOF.     Follow Up Recommendations Home health PT;Follow surgeon's recommendation for DC plan and follow-up therapies    Equipment Recommendations  None recommended by PT    Recommendations for Other Services       Precautions / Restrictions Precautions Precautions: Fall Precaution Comments: no KI required, pt able to perform SLR independently Restrictions Weight Bearing Restrictions: Yes LLE Weight Bearing: Weight bearing as tolerated       Mobility  Bed Mobility Overal bed mobility: Modified Independent             General bed mobility comments: mod I to get EOB, no physical assist required  Transfers Overall transfer level: Needs assistance Equipment used: Rolling walker (2 wheeled) Transfers: Sit to/from Stand Sit to Stand: Min guard         General transfer comment: min guard for safety, pt educated on safe technique with RW, pt steady during transfer from bed, recliner and toilet  Ambulation/Gait Ambulation/Gait assistance: Min guard Gait Distance (Feet): 175 Feet Assistive device: Rolling walker (2 wheeled) Gait Pattern/deviations: Step-through pattern;Decreased stride length;Decreased stance time - left;Decreased weight shift to left;Antalgic Gait velocity: dec   General Gait Details: mildly antalgic, pt ambulated >129ft with RW, pt progressing to step through gait pattern, no buckling or LOB noted, overall steady  Stairs Stairs: Yes Stairs assistance: Min guard Stair Management: Step to pattern;Two rails;Forwards Number of Stairs: 2 General stair comments: pt educated on up with the good down with the bad technique, pt ascended/descended 2 steps with BHR safely with min guard A, daughter present for education on proper guarding and assist, pt steady with steps and will have ramp entrance at home  Wheelchair Mobility    Modified Rankin (Stroke Patients Only)       Balance Overall balance assessment: Mild deficits observed, not formally tested  Pertinent Vitals/Pain Pain Assessment: Faces Faces Pain Scale: Hurts a little bit Pain Location: L knee Pain Descriptors / Indicators: Sore;Operative site guarding;Discomfort Pain Intervention(s): Limited activity within patient's tolerance;Monitored during session;Premedicated before session;RN gave pain meds during session;Ice applied    Home Living Family/patient expects to be  discharged to:: Private residence Living Arrangements: Children Available Help at Discharge: Family;Available 24 hours/day Type of Home: House Home Access: Ramped entrance     Home Layout: One level Home Equipment: Walker - 2 wheels;Bedside commode;Hand held shower head      Prior Function Level of Independence: Independent         Comments: Pt ambulated without AD, was teaching exercise classes until recently (closed), driving, 1 fall in past 12 months     Hand Dominance   Dominant Hand: Right    Extremity/Trunk Assessment   Upper Extremity Assessment Upper Extremity Assessment: Overall WFL for tasks assessed    Lower Extremity Assessment Lower Extremity Assessment: Overall WFL for tasks assessed;LLE deficits/detail LLE Deficits / Details: dec ROM, strength and balance consistent with recent surgery    Cervical / Trunk Assessment Cervical / Trunk Assessment: Normal  Communication   Communication: No difficulties  Cognition Arousal/Alertness: Awake/alert Behavior During Therapy: WFL for tasks assessed/performed Overall Cognitive Status: Within Functional Limits for tasks assessed                                        General Comments      Exercises Total Joint Exercises Ankle Circles/Pumps: AROM;Both;10 reps Quad Sets: AROM;10 reps;Left Heel Slides: AROM;10 reps;Left Straight Leg Raises: AROM;10 reps;Left Long Arc Quad: AROM;5 reps;Left Goniometric ROM: -3-50 Other Exercises Other Exercises:    Assessment/Plan    PT Assessment Patient needs continued PT services  PT Problem List Decreased strength;Decreased mobility;Decreased range of motion;Decreased activity tolerance;Decreased balance;Decreased knowledge of use of DME;Pain       PT Treatment Interventions DME instruction;Therapeutic exercise;Gait training;Balance training;Stair training;Neuromuscular re-education;Functional mobility training;Therapeutic activities;Patient/family  education    PT Goals (Current goals can be found in the Care Plan section)  Acute Rehab PT Goals Patient Stated Goal: go home and return to PLOF PT Goal Formulation: With patient Time For Goal Achievement: 12/10/19 Potential to Achieve Goals: Good    Frequency BID   Barriers to discharge        Co-evaluation               AM-PAC PT "6 Clicks" Mobility  Outcome Measure Help needed turning from your back to your side while in a flat bed without using bedrails?: A Little Help needed moving from lying on your back to sitting on the side of a flat bed without using bedrails?: A Little Help needed moving to and from a bed to a chair (including a wheelchair)?: A Little Help needed standing up from a chair using your arms (e.g., wheelchair or bedside chair)?: A Little Help needed to walk in hospital room?: A Little Help needed climbing 3-5 steps with a railing? : A Little 6 Click Score: 18    End of Session Equipment Utilized During Treatment: Gait belt Activity Tolerance: Patient tolerated treatment well Patient left: in chair;with family/visitor present;with nursing/sitter in room Nurse Communication: Mobility status PT Visit Diagnosis: Difficulty in walking, not elsewhere classified (R26.2);Other abnormalities of gait and mobility (R26.89)    Time: 2951-8841 PT Time Calculation (min) (ACUTE ONLY): 39 min  Charges:   PT Evaluation $PT Eval Moderate Complexity: 1 Mod PT Treatments $Gait Training: 8-22 mins $Therapeutic Activity: 8-22 mins       Karoline Caldwell PT, DPT 6:12 PM,11/26/19 [phone]   Mills Mitton Shyrl Numbers 11/26/2019, 6:07 PM

## 2019-11-27 DIAGNOSIS — M19042 Primary osteoarthritis, left hand: Secondary | ICD-10-CM | POA: Diagnosis not present

## 2019-11-27 DIAGNOSIS — Z471 Aftercare following joint replacement surgery: Secondary | ICD-10-CM | POA: Diagnosis not present

## 2019-11-27 DIAGNOSIS — G43909 Migraine, unspecified, not intractable, without status migrainosus: Secondary | ICD-10-CM | POA: Diagnosis not present

## 2019-11-27 DIAGNOSIS — E041 Nontoxic single thyroid nodule: Secondary | ICD-10-CM | POA: Diagnosis not present

## 2019-11-27 DIAGNOSIS — U071 COVID-19: Secondary | ICD-10-CM | POA: Diagnosis not present

## 2019-11-27 DIAGNOSIS — I10 Essential (primary) hypertension: Secondary | ICD-10-CM | POA: Diagnosis not present

## 2019-11-27 DIAGNOSIS — G8929 Other chronic pain: Secondary | ICD-10-CM | POA: Diagnosis not present

## 2019-11-27 DIAGNOSIS — M19041 Primary osteoarthritis, right hand: Secondary | ICD-10-CM | POA: Diagnosis not present

## 2019-11-27 DIAGNOSIS — M79671 Pain in right foot: Secondary | ICD-10-CM | POA: Diagnosis not present

## 2019-11-27 DIAGNOSIS — Z96652 Presence of left artificial knee joint: Secondary | ICD-10-CM | POA: Diagnosis not present

## 2019-11-27 DIAGNOSIS — Z7901 Long term (current) use of anticoagulants: Secondary | ICD-10-CM | POA: Diagnosis not present

## 2019-11-27 DIAGNOSIS — I051 Rheumatic mitral insufficiency: Secondary | ICD-10-CM | POA: Diagnosis not present

## 2019-11-27 DIAGNOSIS — Z9181 History of falling: Secondary | ICD-10-CM | POA: Diagnosis not present

## 2019-11-28 NOTE — Anesthesia Postprocedure Evaluation (Signed)
Anesthesia Post Note  Patient: Carrie [name]  Procedure(s) Performed: COMPUTER ASSISTED TOTAL KNEE ARTHROPLASTY (Left Knee)  Patient location during evaluation: PACU Anesthesia Type: Spinal Level of consciousness: awake and alert and oriented Pain management: pain level controlled Vital Signs Assessment: post-procedure vital signs reviewed and stable Respiratory status: spontaneous breathing Cardiovascular status: blood pressure returned to baseline Anesthetic complications: no     Last Vitals:  Vitals:   11/26/19 1700 11/26/19 1750  BP:  126/79  Pulse: 81 73  Resp: 16 18  Temp: (!) 36.2 C   SpO2: 96% 98%    Last Pain:  Vitals:   11/27/19 0815  TempSrc:   PainSc: 3                  Rubens Cranston

## 2019-11-28 NOTE — Progress Notes (Signed)
[facility]  Physical Therapy Certification  Patient Details  Name: Carrie [name] MRN: [id number] Date of Birth: 1945/12/12 Medical Diagnosis: Active Problems:   * No active hospital problems. *  Visit Diagnosis: PT Visit Diagnosis: Difficulty in walking, not elsewhere classified (R26.2), Other abnormalities of gait and mobility (R26.89) PT Problem: Decreased strength, Decreased mobility, Decreased range of motion, Decreased activity tolerance, Decreased balance, Decreased knowledge of use of DME, Pain  Goals: Patient will transfer sit to/from stand: with modified independence Pt will Ambulate: > 125 feet, with modified independence, with rolling walker Pt/caregiver will Perform Home Exercise Program: For increased ROM, For increased strengthening, For improved balance, Independently  Duration: Services will be provided through the following date: 12/10/19  Frequency: BID  Amount: one treatment session per day unless otherwise indicated.    Certification Start Date: 11/26/2019 Certification End Date: 12/08/19   PT Treatments/Interventions: DME instruction, Therapeutic exercise, Gait training, Balance training, Stair training, Neuromuscular re-education, Functional mobility training, Therapeutic activities, Patient/family education  Laury Axon 11/28/2019, 3:18 PM

## 2019-12-03 DIAGNOSIS — E041 Nontoxic single thyroid nodule: Secondary | ICD-10-CM | POA: Diagnosis not present

## 2019-12-03 DIAGNOSIS — M19041 Primary osteoarthritis, right hand: Secondary | ICD-10-CM | POA: Diagnosis not present

## 2019-12-03 DIAGNOSIS — U071 COVID-19: Secondary | ICD-10-CM | POA: Diagnosis not present

## 2019-12-03 DIAGNOSIS — M79671 Pain in right foot: Secondary | ICD-10-CM | POA: Diagnosis not present

## 2019-12-03 DIAGNOSIS — Z96652 Presence of left artificial knee joint: Secondary | ICD-10-CM | POA: Diagnosis not present

## 2019-12-03 DIAGNOSIS — I051 Rheumatic mitral insufficiency: Secondary | ICD-10-CM | POA: Diagnosis not present

## 2019-12-03 DIAGNOSIS — I10 Essential (primary) hypertension: Secondary | ICD-10-CM | POA: Diagnosis not present

## 2019-12-03 DIAGNOSIS — G8929 Other chronic pain: Secondary | ICD-10-CM | POA: Diagnosis not present

## 2019-12-03 DIAGNOSIS — M19042 Primary osteoarthritis, left hand: Secondary | ICD-10-CM | POA: Diagnosis not present

## 2019-12-03 DIAGNOSIS — G43909 Migraine, unspecified, not intractable, without status migrainosus: Secondary | ICD-10-CM | POA: Diagnosis not present

## 2019-12-03 DIAGNOSIS — Z7901 Long term (current) use of anticoagulants: Secondary | ICD-10-CM | POA: Diagnosis not present

## 2019-12-03 DIAGNOSIS — Z9181 History of falling: Secondary | ICD-10-CM | POA: Diagnosis not present

## 2019-12-03 DIAGNOSIS — Z471 Aftercare following joint replacement surgery: Secondary | ICD-10-CM | POA: Diagnosis not present

## 2019-12-11 DIAGNOSIS — M25562 Pain in left knee: Secondary | ICD-10-CM | POA: Diagnosis not present

## 2019-12-11 DIAGNOSIS — R29898 Other symptoms and signs involving the musculoskeletal system: Secondary | ICD-10-CM | POA: Diagnosis not present

## 2019-12-11 DIAGNOSIS — M25662 Stiffness of left knee, not elsewhere classified: Secondary | ICD-10-CM | POA: Diagnosis not present

## 2019-12-11 DIAGNOSIS — Z96652 Presence of left artificial knee joint: Secondary | ICD-10-CM | POA: Diagnosis not present

## 2019-12-17 DIAGNOSIS — R29898 Other symptoms and signs involving the musculoskeletal system: Secondary | ICD-10-CM | POA: Diagnosis not present

## 2019-12-17 DIAGNOSIS — Z96652 Presence of left artificial knee joint: Secondary | ICD-10-CM | POA: Diagnosis not present

## 2019-12-17 DIAGNOSIS — M25562 Pain in left knee: Secondary | ICD-10-CM | POA: Diagnosis not present

## 2019-12-17 DIAGNOSIS — M25662 Stiffness of left knee, not elsewhere classified: Secondary | ICD-10-CM | POA: Diagnosis not present

## 2019-12-19 ENCOUNTER — Encounter: Payer: Self-pay | Admitting: *Deleted

## 2019-12-19 DIAGNOSIS — R29898 Other symptoms and signs involving the musculoskeletal system: Secondary | ICD-10-CM | POA: Diagnosis not present

## 2019-12-19 DIAGNOSIS — Z96652 Presence of left artificial knee joint: Secondary | ICD-10-CM | POA: Diagnosis not present

## 2019-12-19 DIAGNOSIS — M25662 Stiffness of left knee, not elsewhere classified: Secondary | ICD-10-CM | POA: Diagnosis not present

## 2019-12-19 DIAGNOSIS — M25562 Pain in left knee: Secondary | ICD-10-CM | POA: Diagnosis not present

## 2019-12-26 DIAGNOSIS — M25562 Pain in left knee: Secondary | ICD-10-CM | POA: Diagnosis not present

## 2019-12-26 DIAGNOSIS — M25662 Stiffness of left knee, not elsewhere classified: Secondary | ICD-10-CM | POA: Diagnosis not present

## 2019-12-26 DIAGNOSIS — Z96652 Presence of left artificial knee joint: Secondary | ICD-10-CM | POA: Diagnosis not present

## 2019-12-26 DIAGNOSIS — R29898 Other symptoms and signs involving the musculoskeletal system: Secondary | ICD-10-CM | POA: Diagnosis not present

## 2019-12-28 DIAGNOSIS — Z96652 Presence of left artificial knee joint: Secondary | ICD-10-CM | POA: Diagnosis not present

## 2019-12-28 DIAGNOSIS — M25662 Stiffness of left knee, not elsewhere classified: Secondary | ICD-10-CM | POA: Diagnosis not present

## 2019-12-28 DIAGNOSIS — M25562 Pain in left knee: Secondary | ICD-10-CM | POA: Diagnosis not present

## 2019-12-28 DIAGNOSIS — R29898 Other symptoms and signs involving the musculoskeletal system: Secondary | ICD-10-CM | POA: Diagnosis not present

## 2020-01-02 DIAGNOSIS — R29898 Other symptoms and signs involving the musculoskeletal system: Secondary | ICD-10-CM | POA: Diagnosis not present

## 2020-01-02 DIAGNOSIS — M25662 Stiffness of left knee, not elsewhere classified: Secondary | ICD-10-CM | POA: Diagnosis not present

## 2020-01-02 DIAGNOSIS — Z96652 Presence of left artificial knee joint: Secondary | ICD-10-CM | POA: Diagnosis not present

## 2020-01-02 DIAGNOSIS — M25562 Pain in left knee: Secondary | ICD-10-CM | POA: Diagnosis not present

## 2020-01-04 DIAGNOSIS — R29898 Other symptoms and signs involving the musculoskeletal system: Secondary | ICD-10-CM | POA: Diagnosis not present

## 2020-01-04 DIAGNOSIS — M25562 Pain in left knee: Secondary | ICD-10-CM | POA: Diagnosis not present

## 2020-01-04 DIAGNOSIS — M25662 Stiffness of left knee, not elsewhere classified: Secondary | ICD-10-CM | POA: Diagnosis not present

## 2020-01-04 DIAGNOSIS — Z96652 Presence of left artificial knee joint: Secondary | ICD-10-CM | POA: Diagnosis not present

## 2020-01-09 DIAGNOSIS — M25662 Stiffness of left knee, not elsewhere classified: Secondary | ICD-10-CM | POA: Diagnosis not present

## 2020-01-09 DIAGNOSIS — R29898 Other symptoms and signs involving the musculoskeletal system: Secondary | ICD-10-CM | POA: Diagnosis not present

## 2020-01-09 DIAGNOSIS — M25562 Pain in left knee: Secondary | ICD-10-CM | POA: Diagnosis not present

## 2020-01-09 DIAGNOSIS — Z96652 Presence of left artificial knee joint: Secondary | ICD-10-CM | POA: Diagnosis not present

## 2020-01-11 DIAGNOSIS — R29898 Other symptoms and signs involving the musculoskeletal system: Secondary | ICD-10-CM | POA: Diagnosis not present

## 2020-01-11 DIAGNOSIS — M25662 Stiffness of left knee, not elsewhere classified: Secondary | ICD-10-CM | POA: Diagnosis not present

## 2020-01-11 DIAGNOSIS — Z96652 Presence of left artificial knee joint: Secondary | ICD-10-CM | POA: Diagnosis not present

## 2020-01-11 DIAGNOSIS — M25562 Pain in left knee: Secondary | ICD-10-CM | POA: Diagnosis not present

## 2020-01-14 DIAGNOSIS — R29898 Other symptoms and signs involving the musculoskeletal system: Secondary | ICD-10-CM | POA: Diagnosis not present

## 2020-01-14 DIAGNOSIS — M25662 Stiffness of left knee, not elsewhere classified: Secondary | ICD-10-CM | POA: Diagnosis not present

## 2020-01-14 DIAGNOSIS — M25562 Pain in left knee: Secondary | ICD-10-CM | POA: Diagnosis not present

## 2020-01-14 DIAGNOSIS — Z96652 Presence of left artificial knee joint: Secondary | ICD-10-CM | POA: Diagnosis not present

## 2020-01-15 DIAGNOSIS — M1712 Unilateral primary osteoarthritis, left knee: Secondary | ICD-10-CM | POA: Diagnosis not present

## 2020-01-18 DIAGNOSIS — M25562 Pain in left knee: Secondary | ICD-10-CM | POA: Diagnosis not present

## 2020-01-18 DIAGNOSIS — M25662 Stiffness of left knee, not elsewhere classified: Secondary | ICD-10-CM | POA: Diagnosis not present

## 2020-01-18 DIAGNOSIS — R29898 Other symptoms and signs involving the musculoskeletal system: Secondary | ICD-10-CM | POA: Diagnosis not present

## 2020-01-18 DIAGNOSIS — Z96652 Presence of left artificial knee joint: Secondary | ICD-10-CM | POA: Diagnosis not present

## 2020-01-31 DIAGNOSIS — M25562 Pain in left knee: Secondary | ICD-10-CM | POA: Diagnosis not present

## 2020-02-14 DIAGNOSIS — R29898 Other symptoms and signs involving the musculoskeletal system: Secondary | ICD-10-CM | POA: Diagnosis not present

## 2020-02-14 DIAGNOSIS — M25562 Pain in left knee: Secondary | ICD-10-CM | POA: Diagnosis not present

## 2020-03-02 DIAGNOSIS — Z96652 Presence of left artificial knee joint: Secondary | ICD-10-CM | POA: Insufficient documentation

## 2020-03-26 ENCOUNTER — Other Ambulatory Visit: Payer: Self-pay | Admitting: Internal Medicine

## 2020-03-26 DIAGNOSIS — E079 Disorder of thyroid, unspecified: Secondary | ICD-10-CM | POA: Diagnosis not present

## 2020-03-26 DIAGNOSIS — E538 Deficiency of other specified B group vitamins: Secondary | ICD-10-CM | POA: Diagnosis not present

## 2020-03-26 DIAGNOSIS — I10 Essential (primary) hypertension: Secondary | ICD-10-CM | POA: Diagnosis not present

## 2020-03-26 DIAGNOSIS — R809 Proteinuria, unspecified: Secondary | ICD-10-CM | POA: Diagnosis not present

## 2020-03-26 DIAGNOSIS — R413 Other amnesia: Secondary | ICD-10-CM | POA: Diagnosis not present

## 2020-03-26 DIAGNOSIS — E7800 Pure hypercholesterolemia, unspecified: Secondary | ICD-10-CM | POA: Diagnosis not present

## 2020-03-26 DIAGNOSIS — Z0000 Encounter for general adult medical examination without abnormal findings: Secondary | ICD-10-CM | POA: Diagnosis not present

## 2020-04-11 ENCOUNTER — Other Ambulatory Visit: Payer: Self-pay

## 2020-04-11 ENCOUNTER — Ambulatory Visit
Admission: RE | Admit: 2020-04-11 | Discharge: 2020-04-11 | Disposition: A | Discharge status: Home / Self Care | Payer: PPO | Source: Ambulatory Visit | Attending: Internal Medicine | Admitting: Internal Medicine

## 2020-04-11 DIAGNOSIS — R413 Other amnesia: Secondary | ICD-10-CM | POA: Diagnosis not present

## 2020-04-11 DIAGNOSIS — R519 Headache, unspecified: Secondary | ICD-10-CM | POA: Diagnosis not present

## 2020-04-11 IMAGING — CT CT HEAD W/O CM
2 of 4 series · 13 of 47 positions shown, 16 images · non-contrast
Comparison: None.

CLINICAL DATA: Memory change

EXAM:
CT HEAD WITHOUT CONTRAST
TECHNIQUE: Contiguous axial images were obtained from the base of the skull
through the vertex without intravenous contrast.

[Series 2: axial st head 5.00 ax · axial · 0.30mm/px · z∈[-600,-485]mm · 10 of 28 slices shown, 13 images]
[im 2/28  brain]
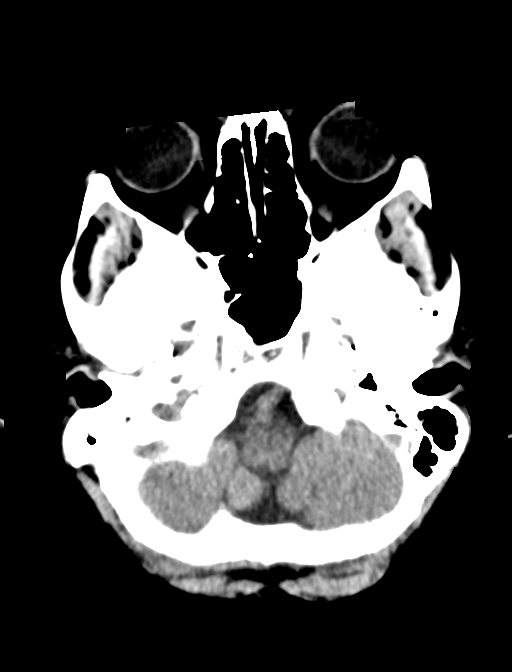
[im 2/28  bone]
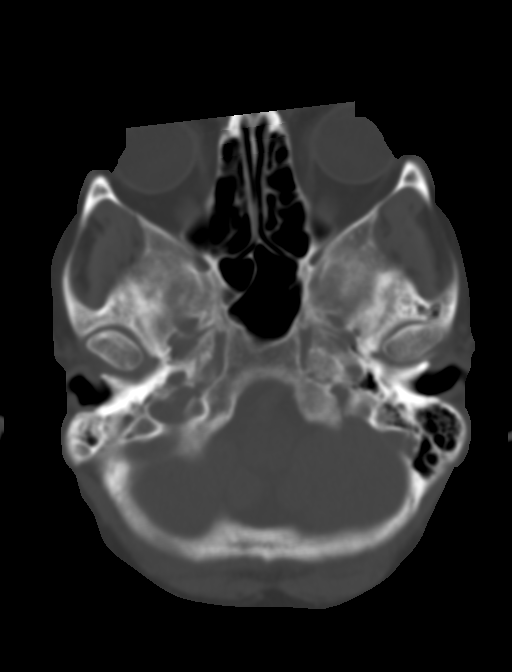
[im 4/28  brain]
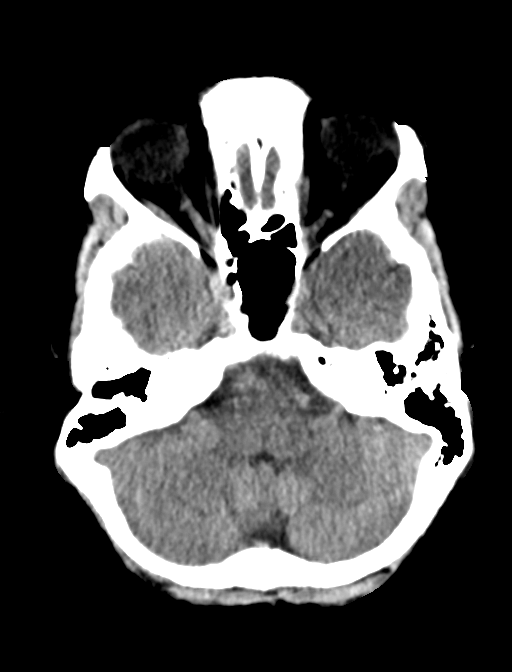
[im 8/28  brain]
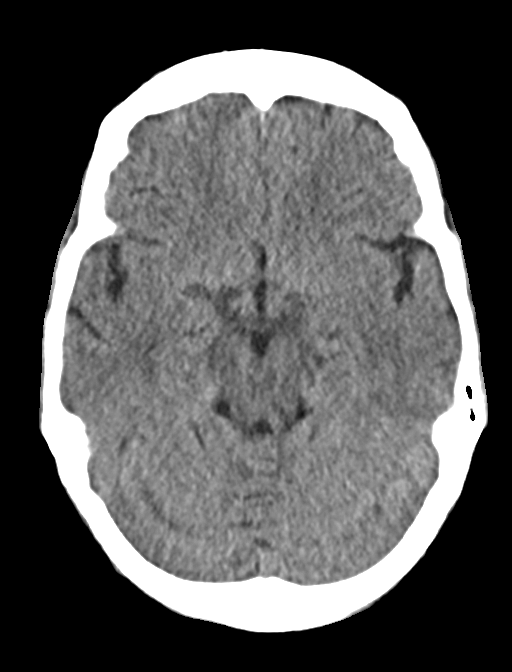
[im 10/28  brain]
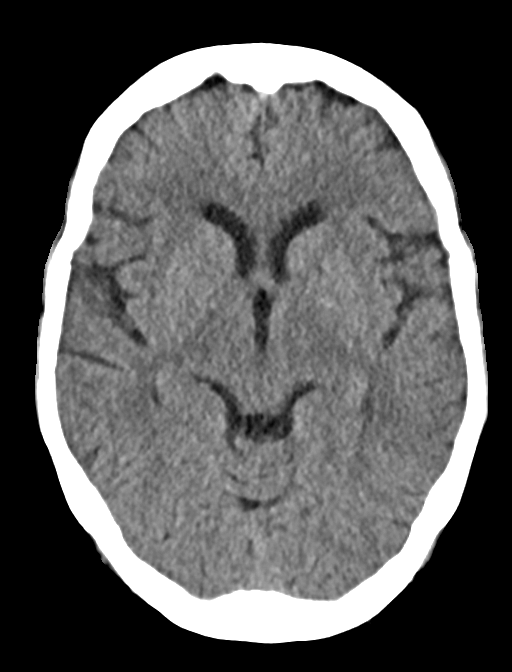
[im 12/28  brain]
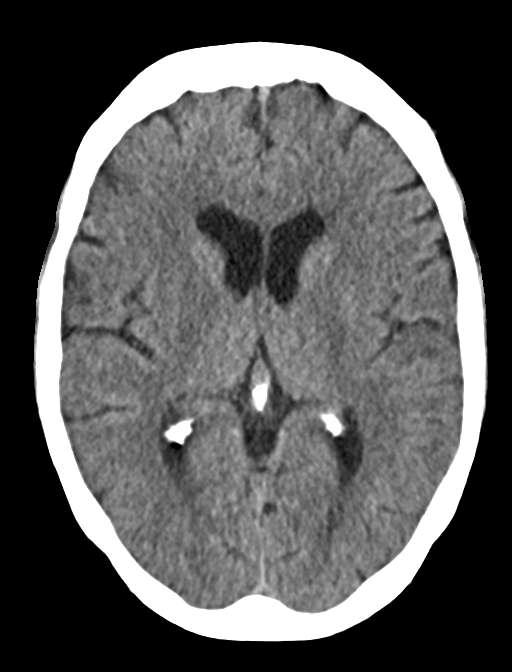
[im 12/28  bone]
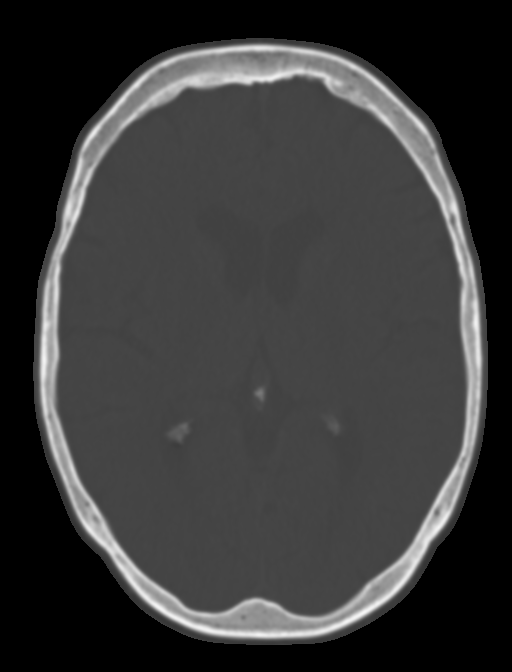
[im 16/28  brain]
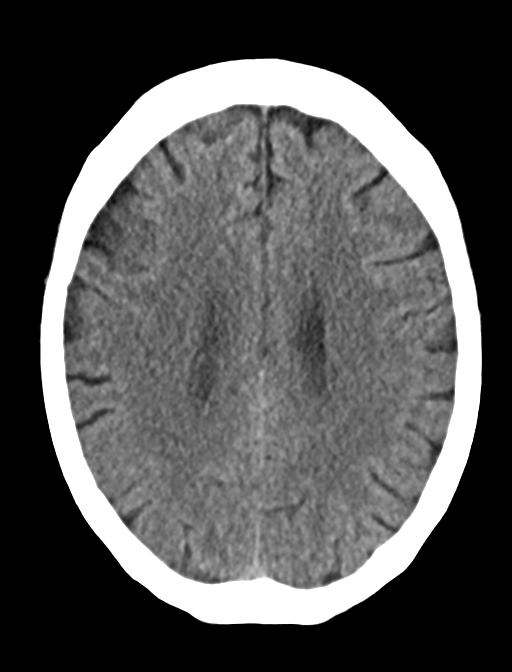
[im 18/28  brain]
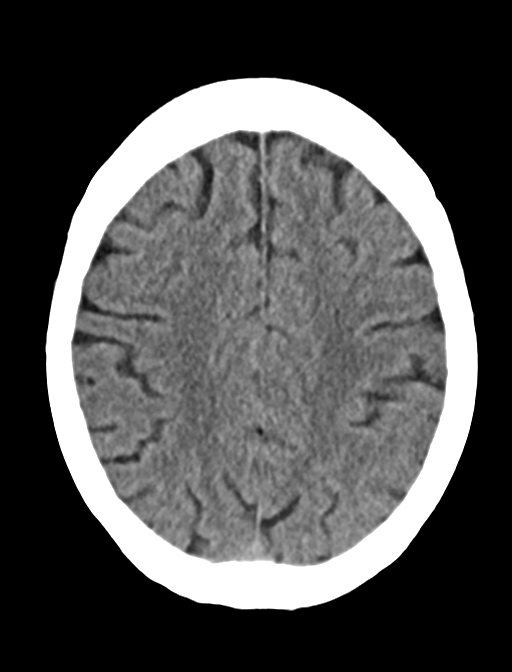
[im 20/28  brain]
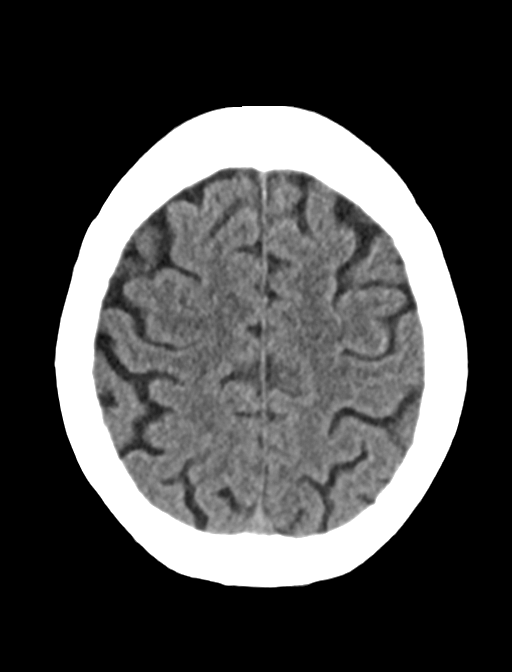
[im 24/28  brain]
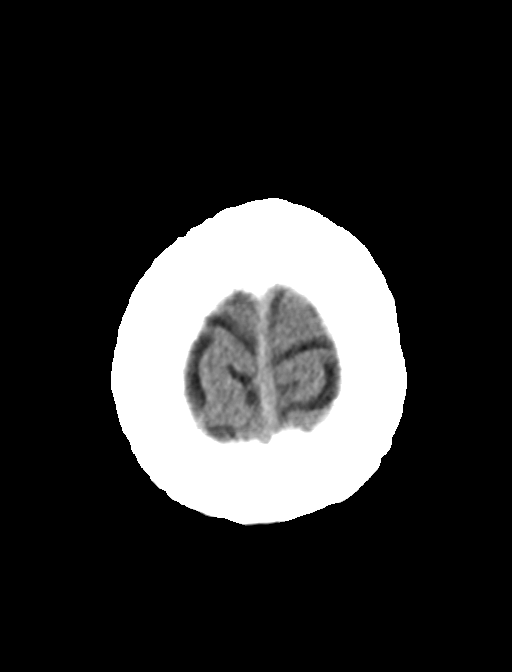
[im 24/28  bone]
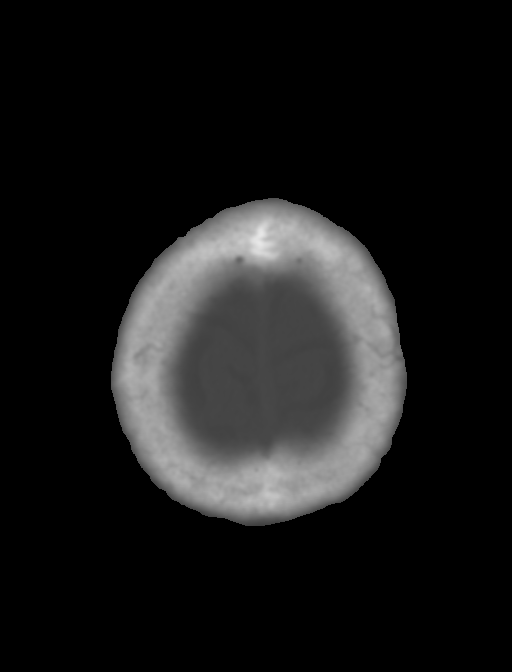
[im 26/28  brain]
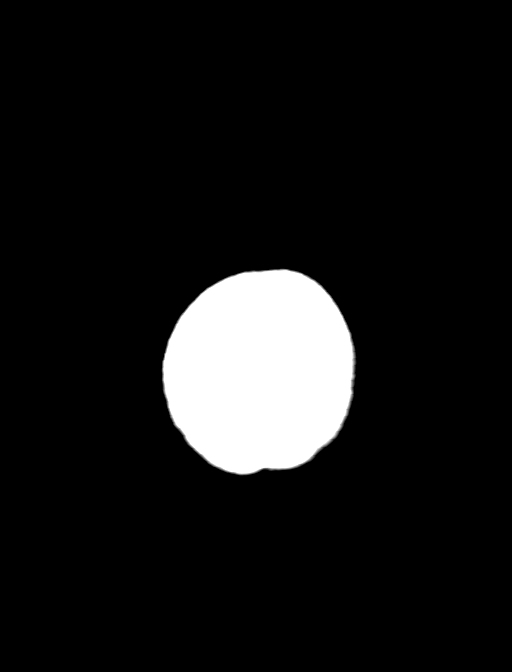

[Series 6: coronals head 3.00 cor · coronal · 0.27mm/px · 3 of 68 slices shown]
[im 23/68  brain]
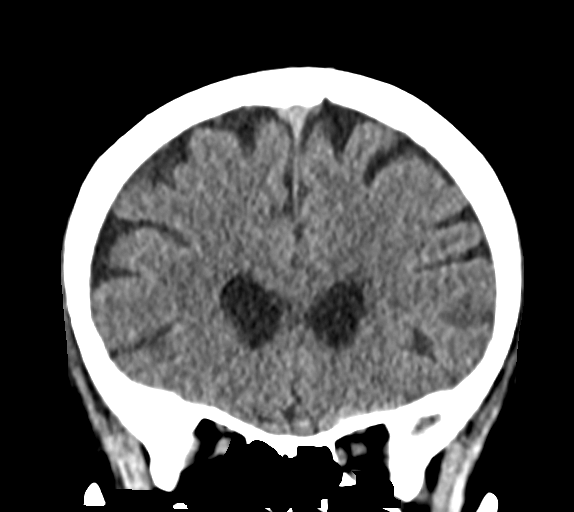
[im 30/68  brain]
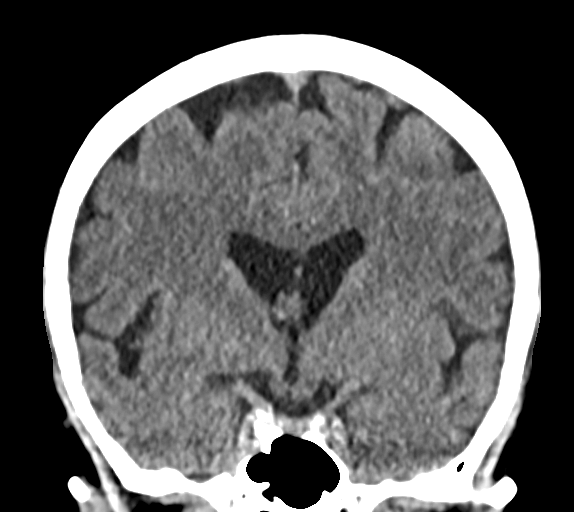
[im 38/68  brain]
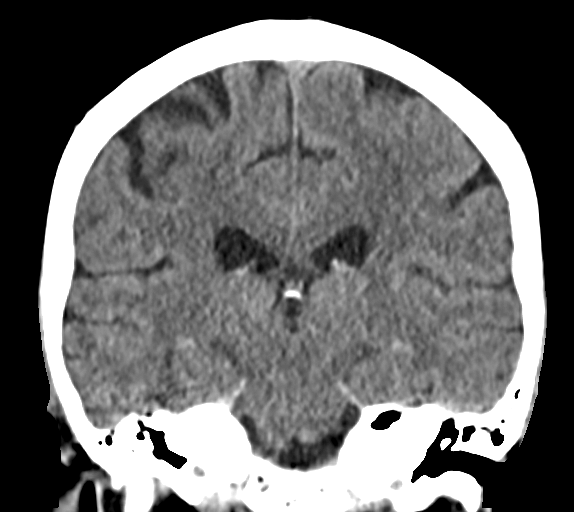

[13 of 47 positions shown; findings below may reference images not displayed]

FINDINGS: Brain: No acute intracranial abnormality. Specifically, no
hemorrhage, hydrocephalus, mass lesion, acute infarction, or
significant intracranial injury.

Vascular: No hyperdense vessel or unexpected calcification.

Skull: No acute calvarial abnormality.

Sinuses/Orbits: Visualized paranasal sinuses and mastoids clear.
Orbital soft tissues unremarkable.

Other: None
IMPRESSION: Normal study for patient's age.

## 2020-05-13 ENCOUNTER — Other Ambulatory Visit: Payer: Self-pay | Admitting: Internal Medicine

## 2020-05-13 DIAGNOSIS — Z1231 Encounter for screening mammogram for malignant neoplasm of breast: Secondary | ICD-10-CM

## 2020-06-01 DIAGNOSIS — M1711 Unilateral primary osteoarthritis, right knee: Secondary | ICD-10-CM | POA: Insufficient documentation

## 2020-06-02 ENCOUNTER — Other Ambulatory Visit: Payer: Self-pay | Admitting: Neurology

## 2020-06-02 ENCOUNTER — Other Ambulatory Visit (HOSPITAL_COMMUNITY): Payer: Self-pay | Admitting: Neurology

## 2020-06-02 DIAGNOSIS — G3184 Mild cognitive impairment, so stated: Secondary | ICD-10-CM

## 2020-06-23 ENCOUNTER — Other Ambulatory Visit: Payer: Self-pay

## 2020-06-23 ENCOUNTER — Ambulatory Visit
Admission: RE | Admit: 2020-06-23 | Discharge: 2020-06-23 | Disposition: A | Discharge status: Home / Self Care | Payer: Medicare HMO | Source: Ambulatory Visit | Attending: Internal Medicine | Admitting: Internal Medicine

## 2020-06-23 ENCOUNTER — Ambulatory Visit (HOSPITAL_COMMUNITY)
Admission: RE | Admit: 2020-06-23 | Discharge: 2020-06-23 | Disposition: A | Discharge status: Home / Self Care | Payer: Medicare HMO | Source: Ambulatory Visit | Attending: Neurology | Admitting: Neurology

## 2020-06-23 DIAGNOSIS — G3184 Mild cognitive impairment, so stated: Secondary | ICD-10-CM | POA: Diagnosis not present

## 2020-06-23 DIAGNOSIS — Z1231 Encounter for screening mammogram for malignant neoplasm of breast: Secondary | ICD-10-CM | POA: Diagnosis not present

## 2020-06-23 IMAGING — MG DIGITAL SCREENING BILAT W/ TOMO W/ CAD
8 series · 8 of 24 positions shown · non-contrast
Comparison: Previous exam(s).

CLINICAL DATA: Screening.

EXAM:
DIGITAL SCREENING BILATERAL MAMMOGRAM WITH TOMO AND CAD

[L CC synth-2D]
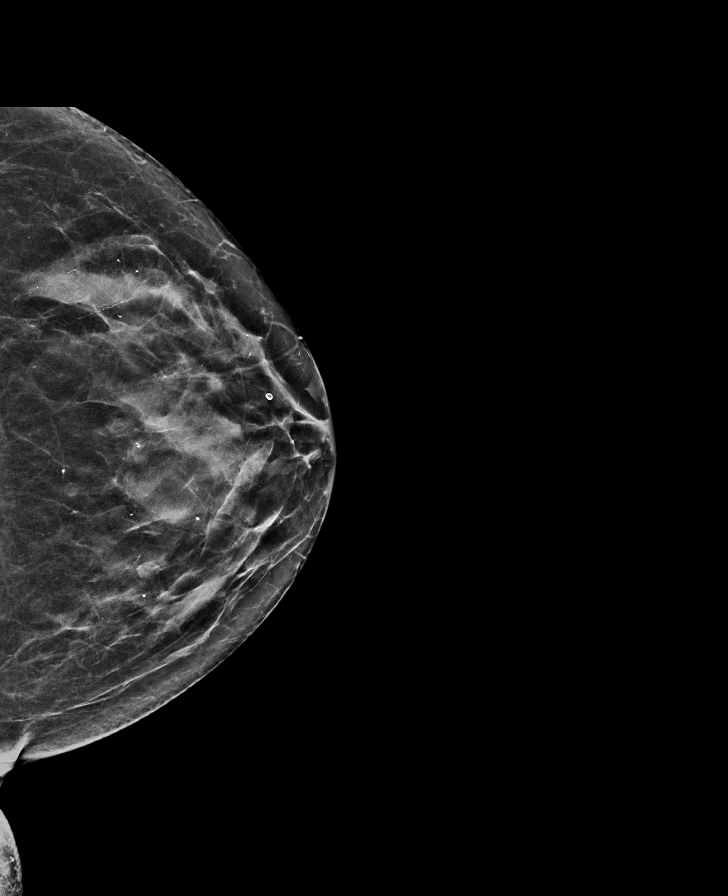

[R MLO synth-2D]
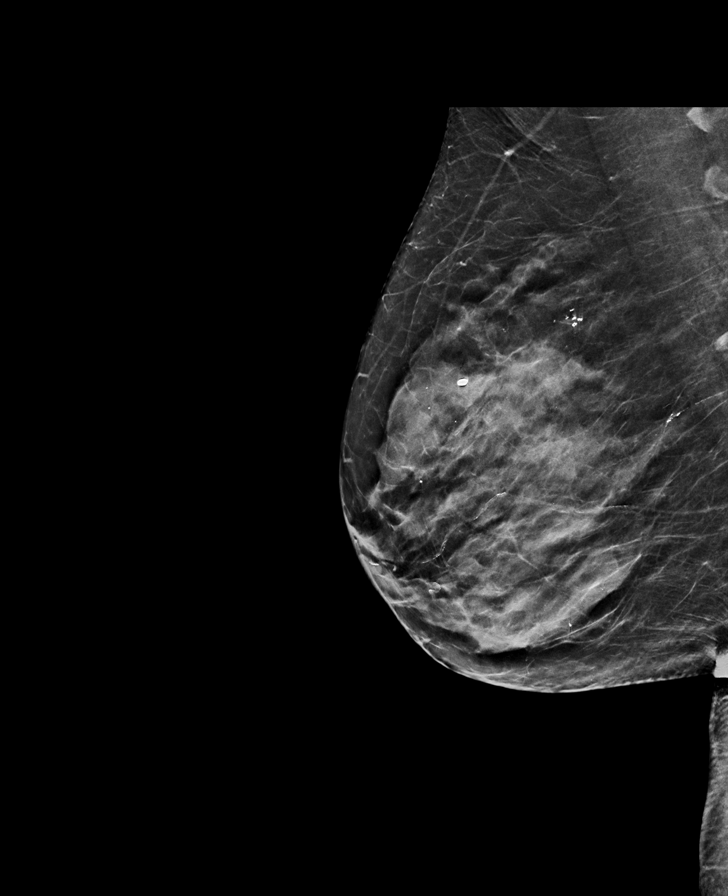

[R CC synth-2D]
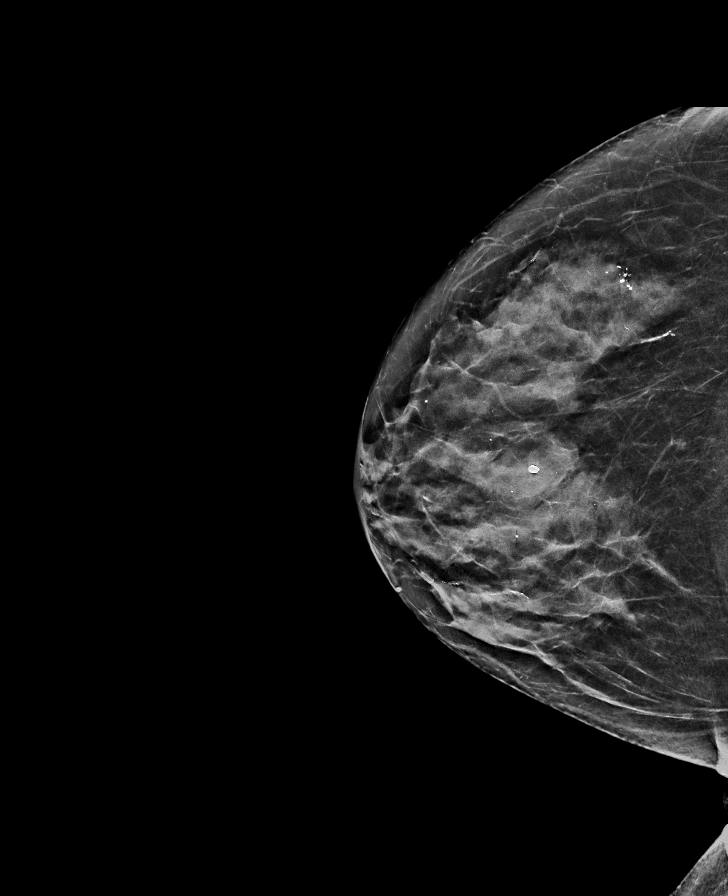

[L MLO synth-2D]
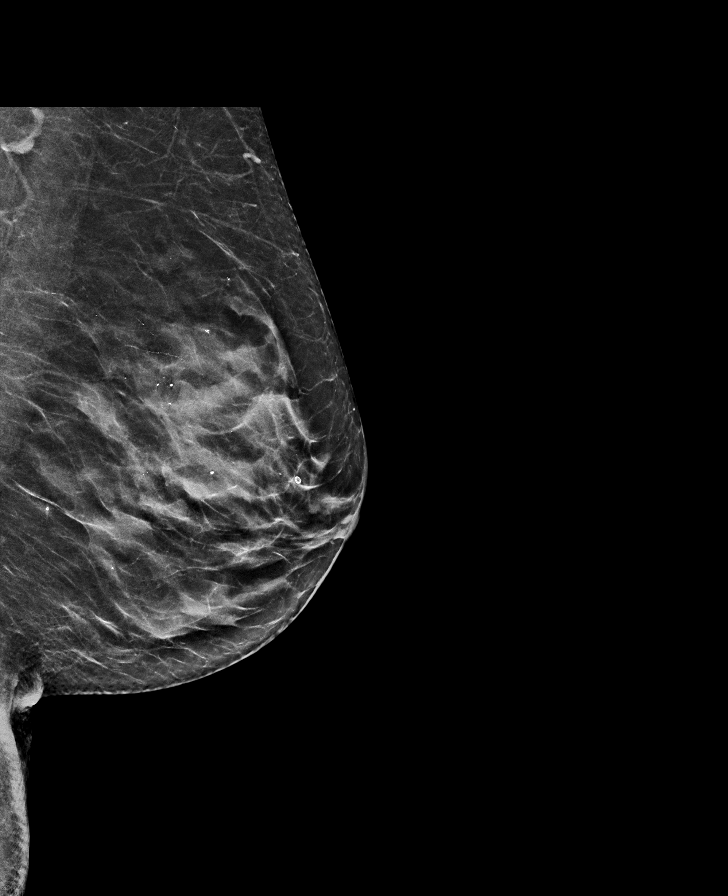

[R CC tomo · tomo slice 29/57.0]
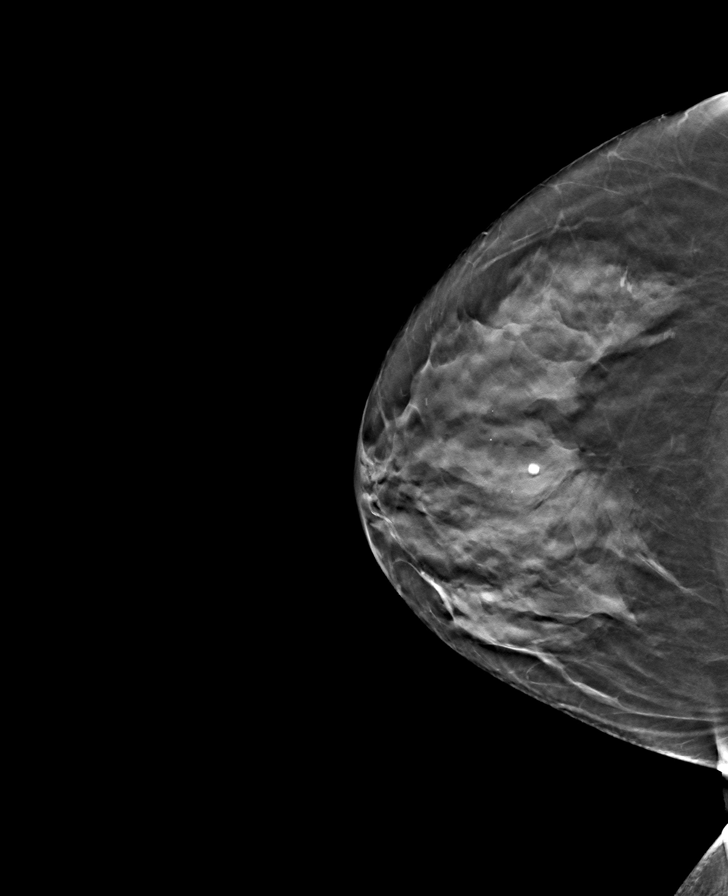

[R MLO tomo · tomo slice 30/59.0]
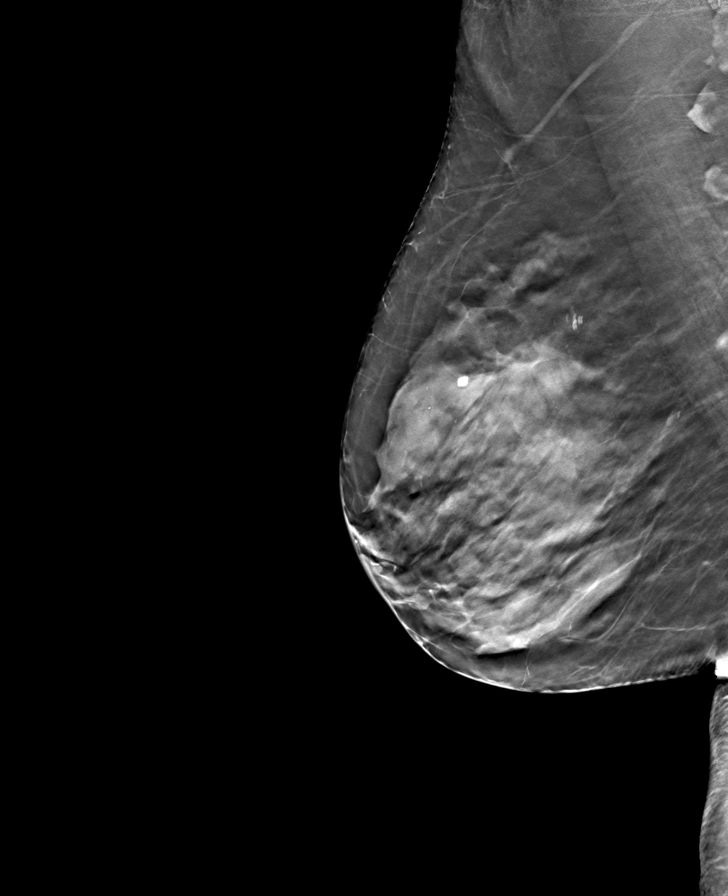

[L MLO tomo · tomo slice 31/60.0]
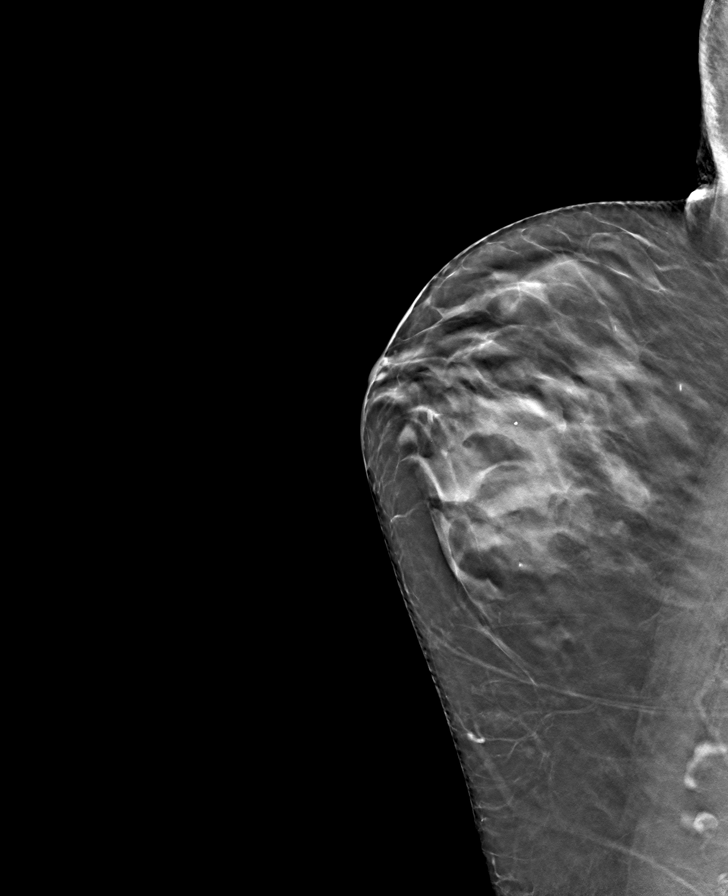

[L CC tomo · tomo slice 29/58.0]
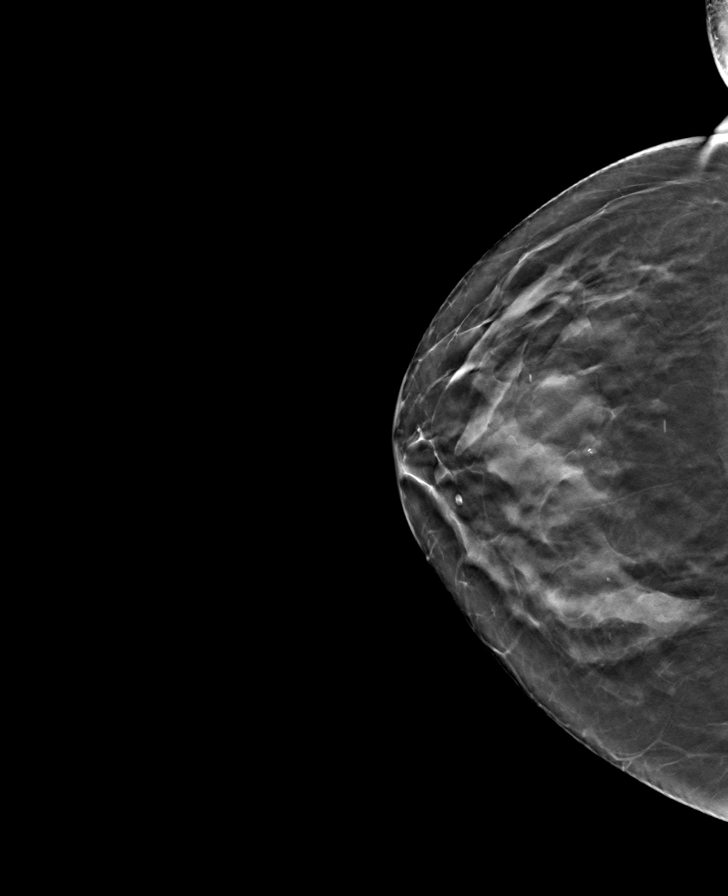

[8 of 24 positions shown; findings below may reference images not displayed]

ACR Breast Density Category c: The breast tissue is heterogeneously
dense, which may obscure small masses.
FINDINGS: There are no findings suspicious for malignancy. Images were
processed with CAD.
IMPRESSION: No mammographic evidence of malignancy. A result letter of this
screening mammogram will be mailed directly to the patient.

RECOMMENDATION:
Screening mammogram in one year. (Code:[5V])

BI-RADS CATEGORY  1: Negative.

## 2020-06-23 IMAGING — MR MR HEAD W/O CM
13 of 14 series · 43 of 48 positions shown · non-contrast
Comparison: CT head [DATE]

CLINICAL DATA: Mild cognitive impairment

EXAM:
MRI HEAD WITHOUT CONTRAST
TECHNIQUE: Multiplanar, multiecho pulse sequences of the brain and surrounding
structures were obtained without intravenous contrast.
Additionally, using NeuroQuant software a 3D volumetric analysis of
the brain was performed and is compared to a normative database
adjusted for age, gender and intracranial volume.

[Series 3: DWI · axial · 3.0mm · 0.94mm/px · z∈[-98,+46]mm · 4 of 100 slices shown (1 of 2)]
[im 1/100]
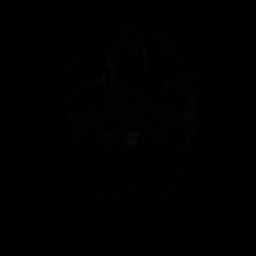
[im 34/100]
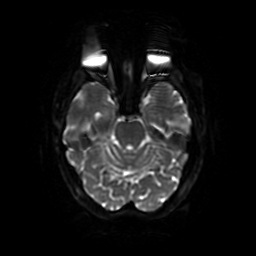
[im 67/100]
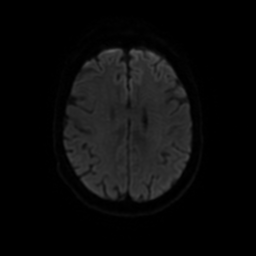
[im 100/100]
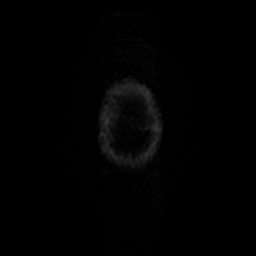

[Series 4: FLAIR · sagittal · 5.0mm · 0.47mm/px · 1 of 23 slices shown (1 of 2)]
[im 1/23]
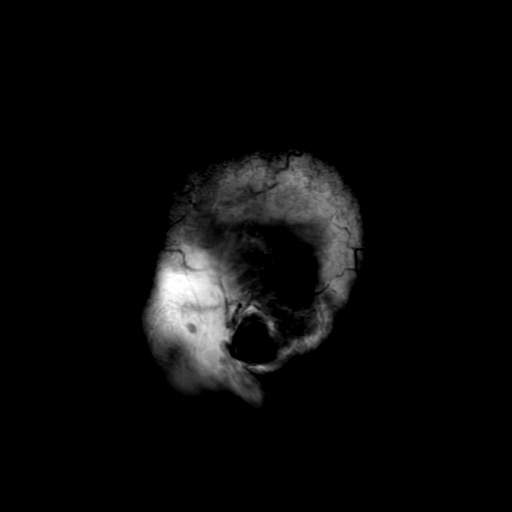

[Series 5: DWI · coronal · 4.0mm · 0.94mm/px · 3 of 74 slices shown (2 of 2)]
[im 1/74]
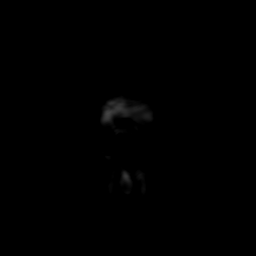
[im 37/74]
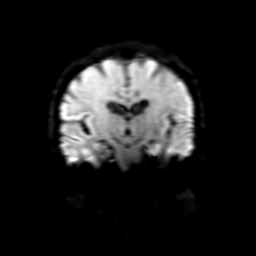
[im 74/74]
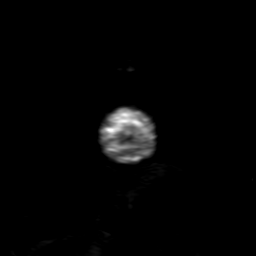

[Series 6: T2 · axial · 5.0mm · 0.47mm/px · 1 of 25 slices shown (1 of 2)]
[im 1/25]
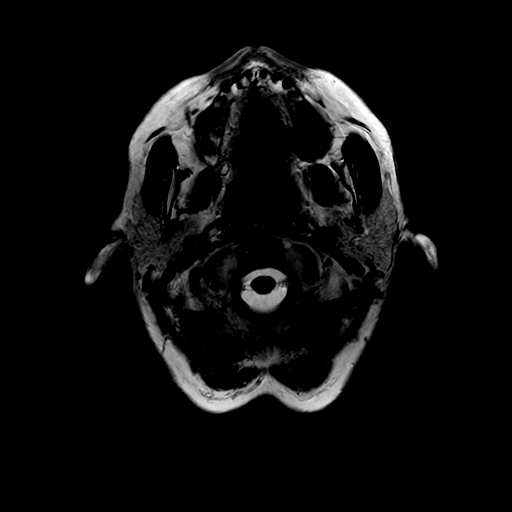

[Series 7: T2 · coronal · 5.0mm · 0.39mm/px · 1 of 31 slices shown (2 of 2)]
[im 1/31]
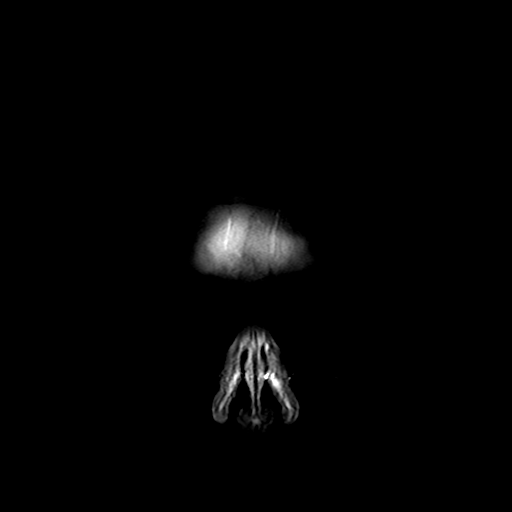

[Series 8: FLAIR · axial · 3.0mm · 0.41mm/px · 1 of 25 slices shown (2 of 2)]
[im 1/25]
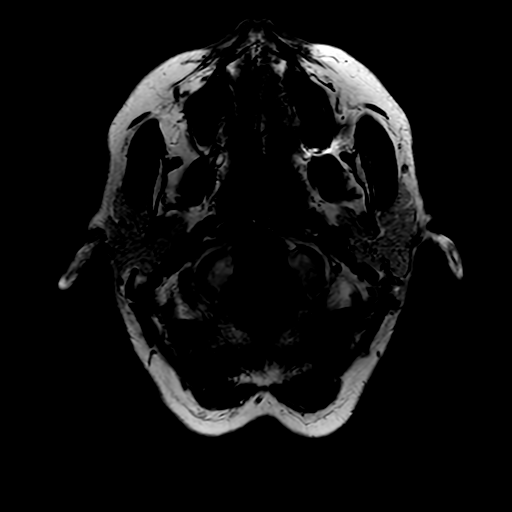

[Series 9: (person_name) · axial · 3.0mm · 0.47mm/px · z∈[-98,+48]mm · 4 of 100 slices shown]
[im 1/100]
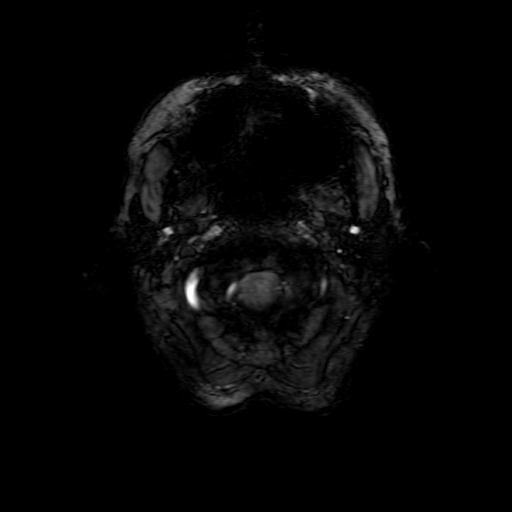
[im 34/100]
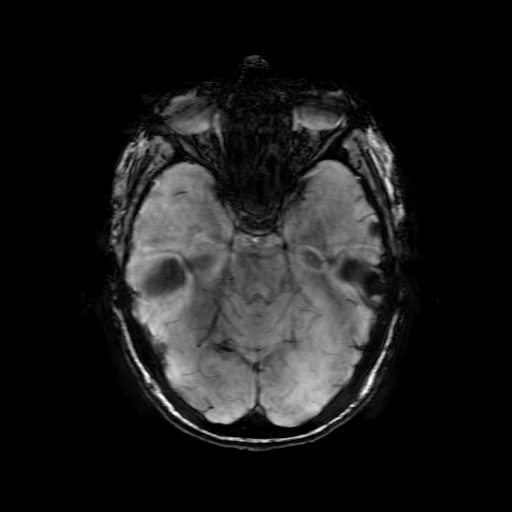
[im 67/100]
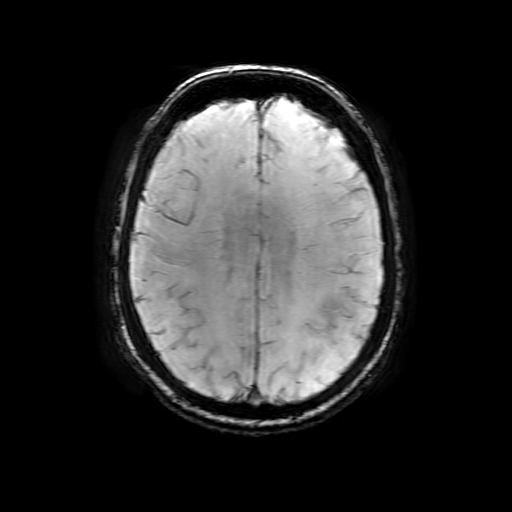
[im 100/100]
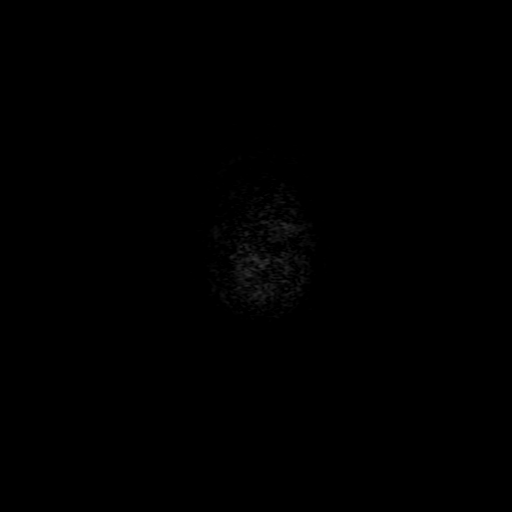

[Series 10: ax 3(person_name) pre · axial · non-contrast · 3.0mm · 0.94mm/px · z∈[-98,+46]mm · 2 of 50 slices shown]
[im 1/50]
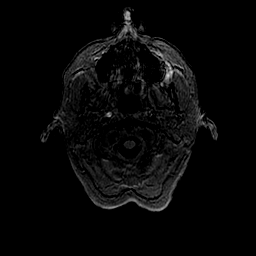
[im 50/50]
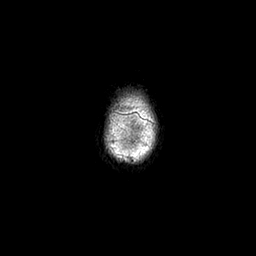

[Series 52: nqsegcb_sc_cor · 1.00mm/px · 8 of 246 slices shown]
[im 1/246]
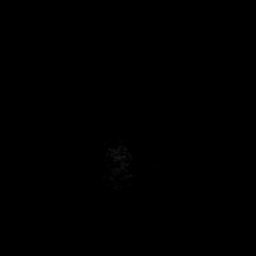
[im 31/246]
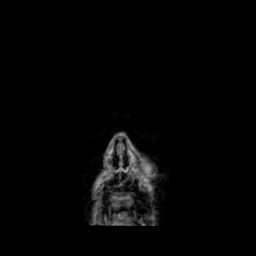
[im 62/246]
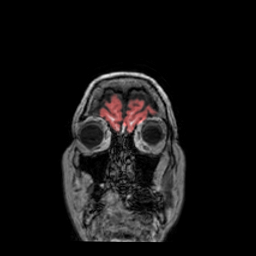
[im 92/246]
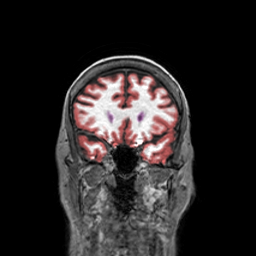
[im 154/246]
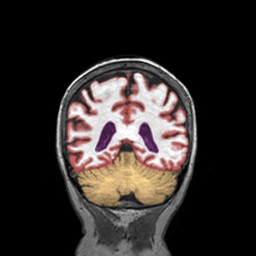
[im 184/246]
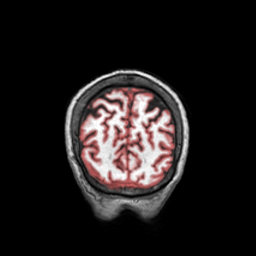
[im 215/246]
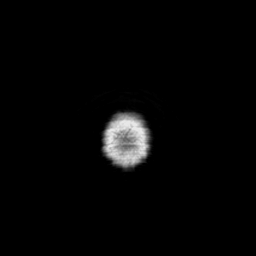
[im 246/246]
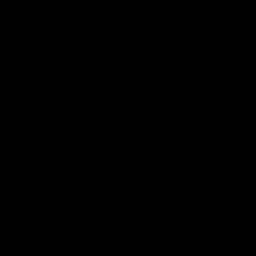

[Series 53: nqsegcb_sc_axl · 1.00mm/px · 8 of 219 slices shown]
[im 1/219]
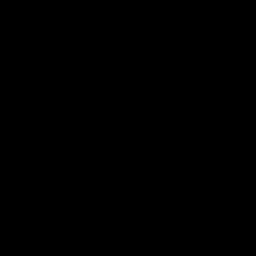
[im 32/219]
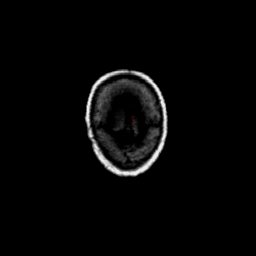
[im 63/219]
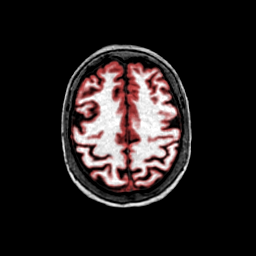
[im 94/219]
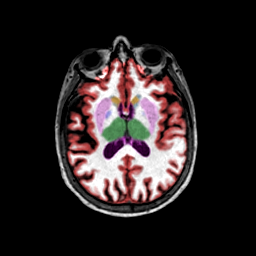
[im 125/219]
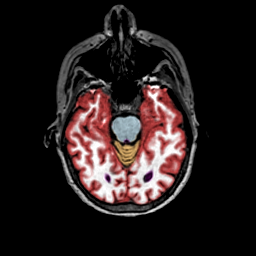
[im 156/219]
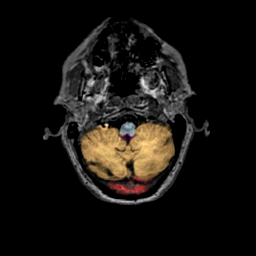
[im 187/219]
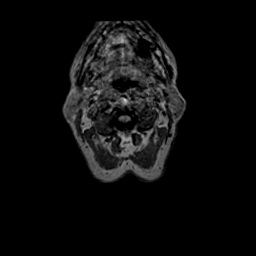
[im 219/219]
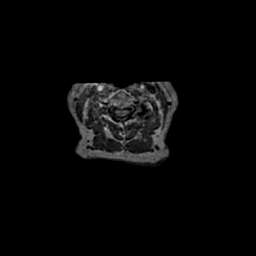

[Series 54: nqsegcb_sc_sag · 1.00mm/px · 7 of 190 slices shown]
[im 1/190]
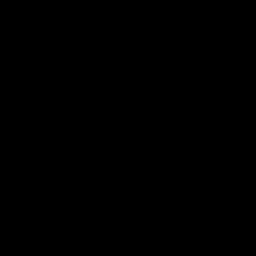
[im 32/190]
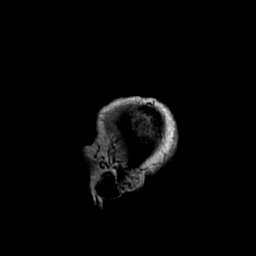
[im 64/190]
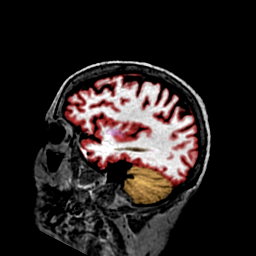
[im 95/190]
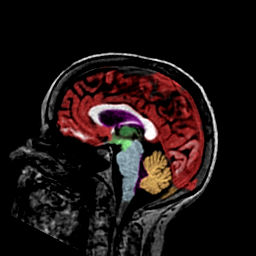
[im 127/190]
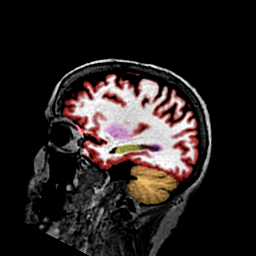
[im 158/190]
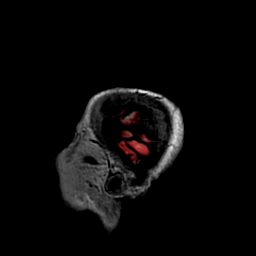
[im 190/190]
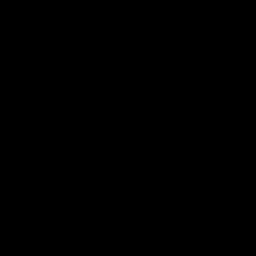

[Series 350: ADC · axial · 3.0mm · 0.94mm/px · z∈[-98,+46]mm · 2 of 50 slices shown (1 of 2)]
[im 1/50]
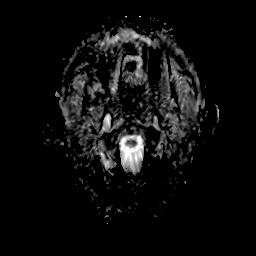
[im 50/50]
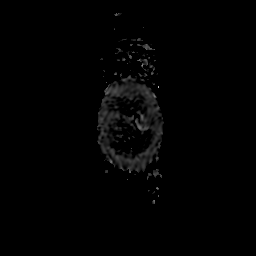

[Series 550: ADC · coronal · 4.0mm · 0.94mm/px · 1 of 36 slices shown (2 of 2)]
[im 1/36]
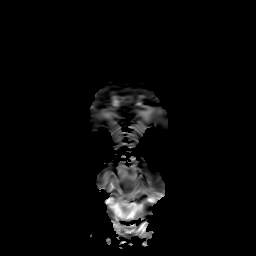

[43 of 48 positions shown; findings below may reference images not displayed]

FINDINGS: Brain: Negative for acute infarct. Several small white matter
hyperintensities in the frontal lobes bilaterally consistent with
mild chronic microvascular ischemia. Negative for hemorrhage or
mass.

Vascular: Normal arterial flow voids

Skull and upper cervical spine: Negative

Sinuses/Orbits: Paranasal sinuses clear. Negative for orbital mass.
Bilateral cataract extraction

NeuroQuant Findings:

Volumetric analysis of the brain was performed, with a fully
detailed report in [HOSPITAL] PACS. Briefly, the comparison with age and
gender matched reference reveals normal hippocampal volume. Normal
ventricular volume.
IMPRESSION: 1. No acute abnormality. Several small white matter hyperintensities
compatible chronic microvascular ischemia, mild for age.
2. NeuroQuant volumetric analysis of the brain, see details on
[HOSPITAL] PACS.

## 2020-11-01 DIAGNOSIS — G459 Transient cerebral ischemic attack, unspecified: Secondary | ICD-10-CM

## 2020-11-01 DIAGNOSIS — I639 Cerebral infarction, unspecified: Secondary | ICD-10-CM

## 2020-11-01 HISTORY — DX: Cerebral infarction, unspecified: I63.9

## 2020-11-01 HISTORY — DX: Transient cerebral ischemic attack, unspecified: G45.9

## 2021-07-07 ENCOUNTER — Other Ambulatory Visit: Payer: Self-pay | Admitting: Neurology

## 2021-07-07 ENCOUNTER — Other Ambulatory Visit (HOSPITAL_COMMUNITY): Payer: Self-pay | Admitting: Neurology

## 2021-07-07 DIAGNOSIS — R292 Abnormal reflex: Secondary | ICD-10-CM

## 2021-07-13 ENCOUNTER — Other Ambulatory Visit (INDEPENDENT_AMBULATORY_CARE_PROVIDER_SITE_OTHER): Payer: Self-pay | Admitting: Nurse Practitioner

## 2021-07-13 DIAGNOSIS — I6523 Occlusion and stenosis of bilateral carotid arteries: Secondary | ICD-10-CM

## 2021-07-13 DIAGNOSIS — I773 Arterial fibromuscular dysplasia: Secondary | ICD-10-CM

## 2021-07-15 ENCOUNTER — Encounter (INDEPENDENT_AMBULATORY_CARE_PROVIDER_SITE_OTHER): Payer: Self-pay | Admitting: Nurse Practitioner

## 2021-07-15 ENCOUNTER — Ambulatory Visit (INDEPENDENT_AMBULATORY_CARE_PROVIDER_SITE_OTHER): Payer: Medicare HMO

## 2021-07-15 ENCOUNTER — Ambulatory Visit (INDEPENDENT_AMBULATORY_CARE_PROVIDER_SITE_OTHER): Payer: Medicare HMO | Admitting: Nurse Practitioner

## 2021-07-15 ENCOUNTER — Other Ambulatory Visit: Payer: Self-pay

## 2021-07-15 VITALS — BP 150/75 | HR 64 | Resp 16 | Ht 60.0 in | Wt 146.6 lb

## 2021-07-15 DIAGNOSIS — I773 Arterial fibromuscular dysplasia: Secondary | ICD-10-CM

## 2021-07-15 DIAGNOSIS — I10 Essential (primary) hypertension: Secondary | ICD-10-CM | POA: Diagnosis not present

## 2021-07-15 DIAGNOSIS — E7800 Pure hypercholesterolemia, unspecified: Secondary | ICD-10-CM

## 2021-07-15 NOTE — Progress Notes (Signed)
Subjective:    Patient ID: Carrie [name], female    DOB: 1945/12/15, 75 y.o.   MRN: [id number] Chief Complaint  Patient presents with  . New Patient (Initial Visit)    Ref Alvira Philips carotid consult    Carrie [name] is a 75 year old female that presents today as a referral from Harlin Rain, [place] after the patient had a CT scan due due to a possible TIA after recent bout of sudden confusion.  On that CT scan there was a beaded appearance of the carotid artery.  The patient was sent for possible evaluation of FMD.  Currently the patient is back to her baseline.  There have been no other symptoms of possible CVA or TIA.  She denies any amaurosis fugax.  Today noninvasive study showed no evidence of stenosis in the bilateral ICAs.  The vertebral arteries demonstrate antegrade flow with normal flow hemodynamics in the bilateral subclavian's.   Review of Systems  Eyes:  Negative for visual disturbance.  All other systems reviewed and are negative.     Objective:   Physical Exam Vitals reviewed.  HENT:     Head: Normocephalic.  Neck:     Vascular: No carotid bruit.  Cardiovascular:     Rate and Rhythm: Normal rate.     Pulses: Normal pulses.  Pulmonary:     Effort: Pulmonary effort is normal.  Skin:    General: Skin is warm and dry.  Neurological:     Mental Status: She is alert and oriented to person, place, and time.  Psychiatric:        Mood and Affect: Mood normal.        Behavior: Behavior normal.        Thought Content: Thought content normal.        Judgment: Judgment normal.    BP (!) 150/75 (BP Location: Right Arm)   Pulse 64   Resp 16   Ht 5' (1.524 m)   Wt 146 lb 9.6 oz (66.5 kg)   BMI 28.63 kg/m   Past Medical History:  Diagnosis Date  . Arthritis   . Cataract cortical, senile   . Cellulitis due to MRSA    right leg (hospitalized [facility])  . Chronic pain in right foot   . Colovaginal fistula   . Diverticulosis   . Family history of adverse reaction to anesthesia    . Fibrocystic breast   . GERD (gastroesophageal reflux disease)   . Heart murmur    mild  . History of blood transfusion 1998   slight rash with  . History of hypertension    off all meds for last 4 years  . History of stomach ulcers   . Hypertension   . Lichen sclerosus of female genitalia   . Migraines   . PONV (postoperative nausea and vomiting)    just once   . Thyroid nodule     Social History   Socioeconomic History  . Marital status: Divorced    Spouse name: Not on file  . Number of children: Not on file  . Years of education: Not on file  . Highest education level: Not on file  Occupational History  . Not on file  Tobacco Use  . Smoking status: Never  . Smokeless tobacco: Never  Vaping Use  . Vaping Use: Never used  Substance and Sexual Activity  . Alcohol use: No  . Drug use: No  . Sexual activity: Not on file  Other Topics Concern  .  Not on file  Social History Narrative  . Not on file   Social Determinants of Health   Financial Resource Strain: Not on file  Food Insecurity: Not on file  Transportation Needs: Not on file  Physical Activity: Not on file  Stress: Not on file  Social Connections: Not on file  Intimate Partner Violence: Not on file    Past Surgical History:  Procedure Laterality Date  . ABDOMINAL HYSTERECTOMY  1998  . benign breast tumor removed Left 1967  . BREAST EXCISIONAL BIOPSY Bilateral 1967 for left, 2010 for right   benign  . cataract surgery  8 yrs ago   both eyes  . COLON RESECTION  2015   anterior sigmoid  . COLON SURGERY     8"colon removed  . COLONOSCOPY WITH PROPOFOL N/A 05/30/2015   Procedure: COLONOSCOPY WITH PROPOFOL;  Surgeon: Wallace Cullens, MD;  Location: [facility] ENDOSCOPY;  Service: Gastroenterology;  Laterality: N/A;  . EUS N/A 02/22/2013   Procedure: UPPER ENDOSCOPIC ULTRASOUND (EUS) LINEAR;  Surgeon: Rachael Fee, MD;  Location: [facility] ENDOSCOPY;  Service: Endoscopy;  Laterality: N/A;  . excision cyst index  finger Left    extensor tendon and extensor retinacular release  . EYE SURGERY Bilateral    cataract  . KNEE ARTHROPLASTY Left 11/26/2019   Procedure: COMPUTER ASSISTED TOTAL KNEE ARTHROPLASTY;  Surgeon: Donato Heinz, MD;  Location: [facility] ORS;  Service: Orthopedics;  Laterality: Left;  . right benign breast lump removed  2009  . tummy tuck    . WRIST GANGLION EXCISION Left     Family History  Problem Relation Age of Onset  . Breast cancer Maternal Aunt 50    Allergies  Allergen Reactions  . Other Rash    Severe local reaction  pneumonia vacine  . Donepezil Nausea Only  . Pneumococcal Polysaccharide Vaccine Rash    Severe local reaction  Severe local reaction     CBC Latest Ref Rng & Units 04/23/2014 04/19/2014 04/11/2014  WBC 3.6 - 11.0 x10 3/mm 3 - - 8.8  Hemoglobin 12.0 - 16.0 g/dL - - 58.0  Hematocrit 99.8 - 47.0 % - - 37.1  Platelets 150 - 440 x10 3/mm 3 219 188 207      CMP     Component Value Date/Time   NA 139 12/16/2012 1155   K 3.4 (L) 12/16/2012 1155   CL 106 12/16/2012 1155   CO2 26 12/16/2012 1155   GLUCOSE 126 (H) 12/16/2012 1155   BUN 15 12/16/2012 1155   CREATININE 0.73 12/16/2012 1155   CALCIUM 8.7 12/16/2012 1155   PROT 7.7 12/15/2012 1948   ALBUMIN 4.1 12/15/2012 1948   AST 21 12/15/2012 1948   ALT 18 12/15/2012 1948   ALKPHOS 106 12/15/2012 1948   BILITOT 0.2 12/15/2012 1948   GFRNONAA >60 12/16/2012 1155   GFRAA >60 12/16/2012 1155     No results found.     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  However today there is no evidence of significant stenosis.  Therefore there is no role for intervention.  Based on this we will continue to have follow-up given her risk of stenosis.  The patient also had good blood pressure control today.  FMD can affect renal arteries.  Patient is advised that she  begins to have uncontrolled blood pressures despite appropriate  medication management she should inform us so that we can evaluate her renal arteries.  Otherwise we will plan on having the patient follow-up in 2 years with noninvasive studies.  2. Essential hypertension Continue antihypertensive medications as already ordered, these medications have been reviewed and there are no changes at this time.   3. Pure hypercholesterolemia Continue statin as ordered and reviewed, no changes at this time    Current Outpatient Medications on File Prior to Visit  Medication Sig Dispense Refill  . ASPIRIN LOW DOSE 81 MG EC tablet Take 81 mg by mouth daily.    . ASPIRIN-ACETAMINOPHEN-CAFFEINE PO Take by mouth.    Marland Kitchen atorvastatin (LIPITOR) 40 MG tablet Take 40 mg by mouth daily as needed.    . donepezil (ARICEPT) 5 MG tablet Take 5 mg by mouth daily.    Marland Kitchen gabapentin (NEURONTIN) 100 MG capsule Take 100 mg by mouth 3 (three) times daily.    Marland Kitchen losartan (COZAAR) 50 MG tablet Take 50 mg by mouth daily.    Marland Kitchen losartan (COZAAR) 50 MG tablet Take 1 tablet by mouth daily.    . magnesium oxide (MAG-OX) 400 MG tablet Take by mouth.    . Melatonin 5 MG CAPS Take by mouth.    . Multiple Vitamins-Minerals (MULTIVITAMIN WITH MINERALS) tablet Take 1 tablet by mouth daily. 50 +    . UNABLE TO FIND Zzquil    . vitamin B-12 (CYANOCOBALAMIN) 1000 MCG tablet Take 2,500 mcg by mouth daily.    Marland Kitchen zolmitriptan (ZOMIG) 5 MG tablet Take 5 mg by mouth as needed for migraine.    . zolmitriptan (ZOMIG-ZMT) 5 MG disintegrating tablet Take by mouth.    . enoxaparin (LOVENOX) 40 MG/0.4ML injection Inject 0.4 mLs (40 mg total) into the skin daily. (Patient not taking: No sig reported) 5.6 mL 0  . oxyCODONE (ROXICODONE) 5 MG immediate release tablet Take 1-2 tablets (5-10 mg total) by mouth every 4 (four) hours as needed. (Patient not taking: No sig reported) 30 tablet 0  . traMADol (ULTRAM) 50 MG tablet Take 1 tablet (50 mg total) by  mouth every 6 (six) hours as needed. (Patient not taking: No sig reported) 30 tablet 0  . Turmeric Curcumin 500 MG CAPS Take 500 mg by mouth daily. (Patient not taking: No sig reported)     No current facility-administered medications on file prior to visit.    There are no Patient Instructions on file for this visit. No follow-ups on file.   [place]iana Spinner, NP

## 2021-07-16 ENCOUNTER — Ambulatory Visit
Admission: RE | Admit: 2021-07-16 | Discharge: 2021-07-16 | Disposition: A | Discharge status: Home / Self Care | Payer: Medicare HMO | Source: Ambulatory Visit | Attending: Neurology | Admitting: Neurology

## 2021-07-16 DIAGNOSIS — R292 Abnormal reflex: Secondary | ICD-10-CM

## 2021-07-16 IMAGING — MR MR HEAD WO/W CM
16 series · 48 of 48 positions shown · IV contrast (gadavist)
Comparison: Brain MRI [DATE].

CLINICAL DATA: Hyper reflexia. Additional head provided by scanning
technologist: Patient reports weakness in right arm and leg, pain in
legs for 1 month.

EXAM:
MRI HEAD WITHOUT AND WITH CONTRAST
TECHNIQUE: Multiplanar, multiecho pulse sequences of the brain and surrounding
structures were obtained without and with intravenous contrast.
CONTRAST:  6mL GADAVIST GADOBUTROL 1 MMOL/ML IV SOLN

[Series 5: ax dwi_tracew · axial · 3.0mm · 0.65mm/px · z∈[-100,+53]mm · 3 of 48 slices shown]
[im 1/48]
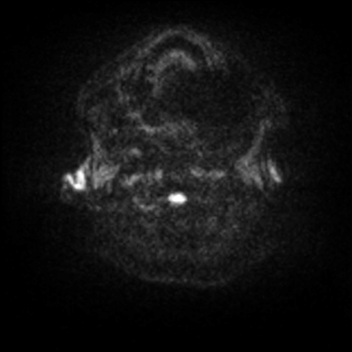
[im 24/48]
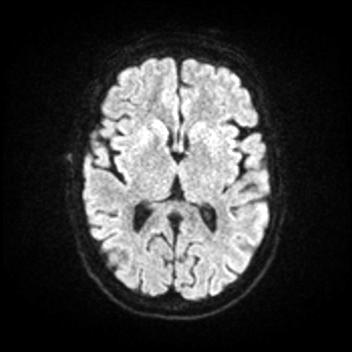
[im 48/48]
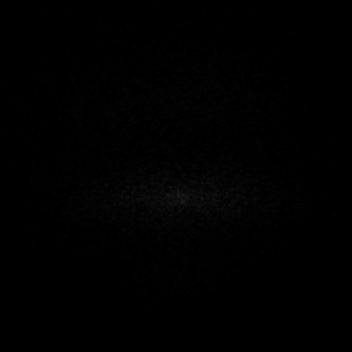

[Series 6: ax dwi_adc · axial · 3.0mm · 0.65mm/px · z∈[-100,+49]mm · 3 of 47 slices shown]
[im 1/47]
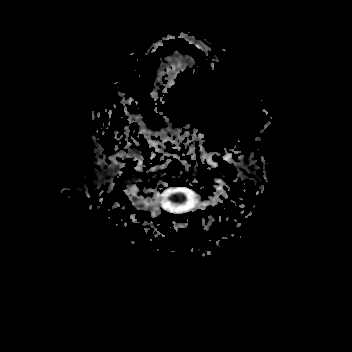
[im 24/47]
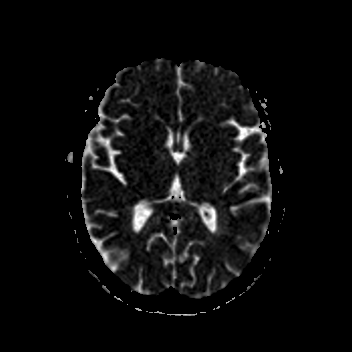
[im 47/47]
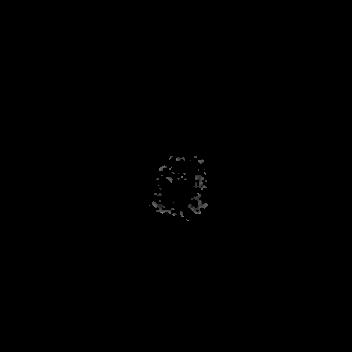

[Series 7: cor dwi_tracew · coronal · 5.0mm · 0.68mm/px · 2 of 40 slices shown]
[im 1/40]
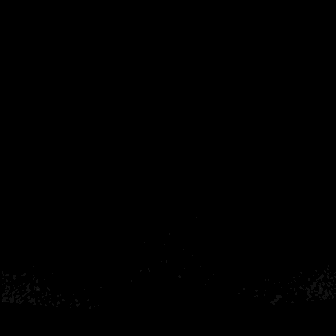
[im 40/40]
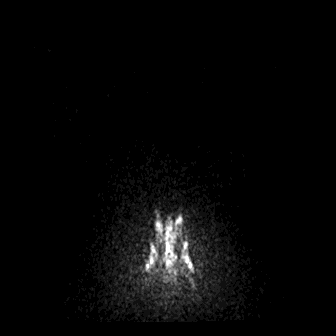

[Series 8: cor dwi_adc · coronal · 5.0mm · 0.68mm/px · 2 of 38 slices shown]
[im 1/38]
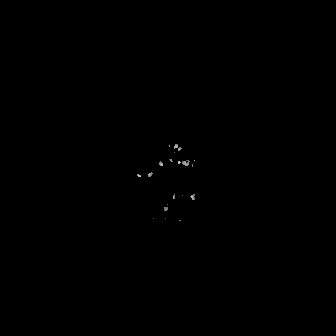
[im 38/38]
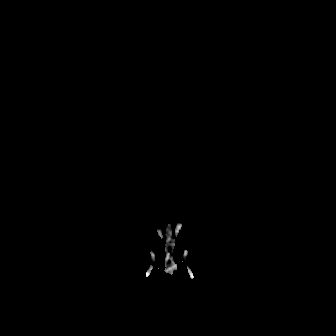

[Series 9: T1 · sagittal · 5.0mm · 0.62mm/px · 1 of 23 slices shown (1 of 2)]
[im 1/23]
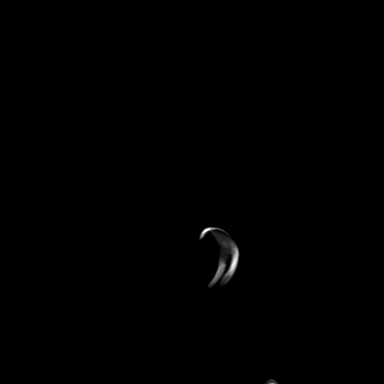

[Series 10: T2 · axial · 5.0mm · 0.53mm/px · 1 of 27 slices shown]
[im 1/27]
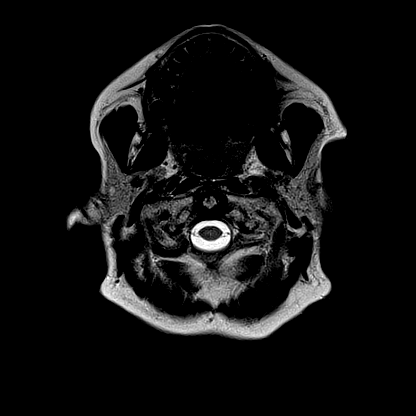

[Series 11: mag_images · axial · 3.0mm · 0.90mm/px · z∈[-111,+63]mm · 3 of 60 slices shown]
[im 1/60]
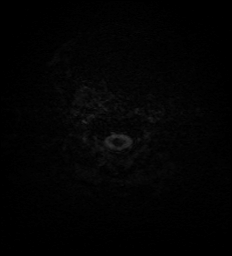
[im 30/60]
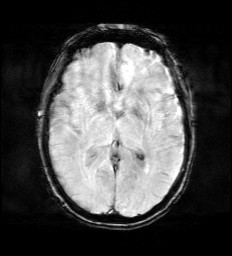
[im 60/60]
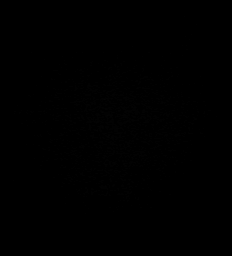

[Series 12: pha_images · axial · 3.0mm · 0.90mm/px · z∈[-111,+63]mm · 3 of 58 slices shown]
[im 1/58]
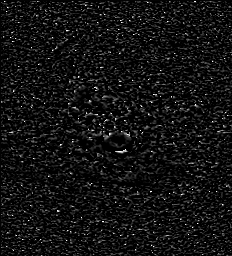
[im 29/58]
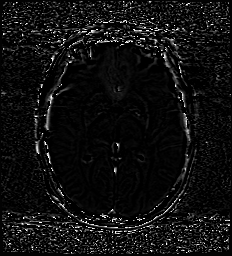
[im 58/58]
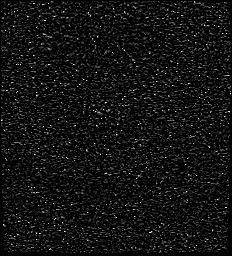

[Series 13: swi_images · axial · 3.0mm · 0.90mm/px · z∈[-111,+63]mm · 3 of 60 slices shown]
[im 1/60]
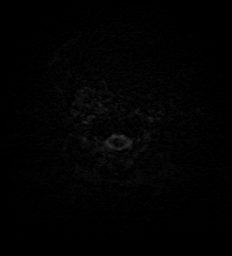
[im 30/60]
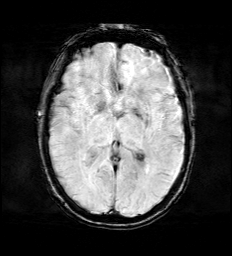
[im 60/60]
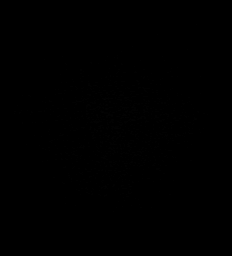

[Series 15: FLAIR · axial · 3.0mm · 0.53mm/px · z∈[-105,+55]mm · 3 of 55 slices shown (1 of 2)]
[im 1/55]
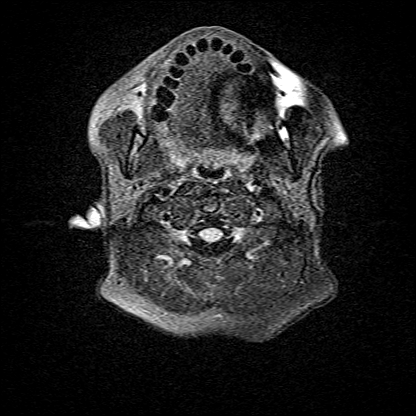
[im 28/55]
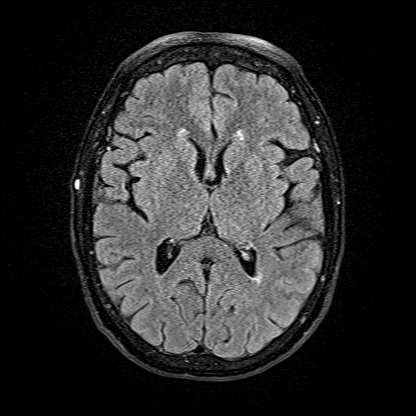
[im 55/55]
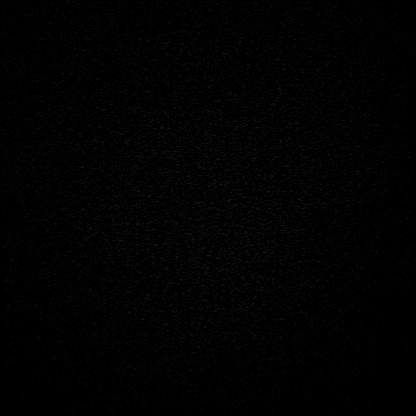

[Series 16: T1 · axial · 1.0mm · 0.98mm/px · z∈[-101,+56]mm · 9 of 160 slices shown (2 of 2)]
[im 1/160]
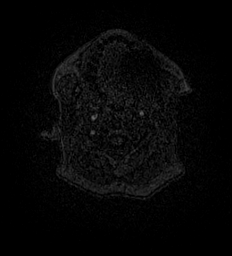
[im 20/160]
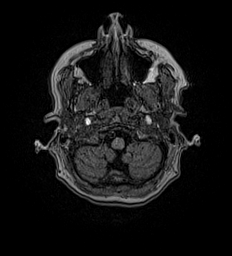
[im 40/160]
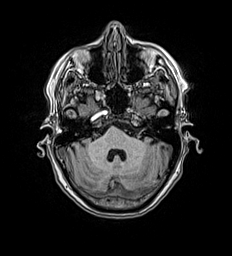
[im 60/160]
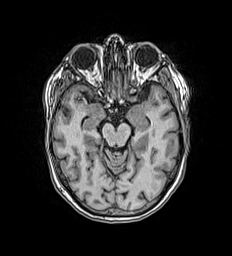
[im 80/160]
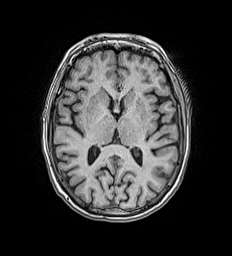
[im 100/160]
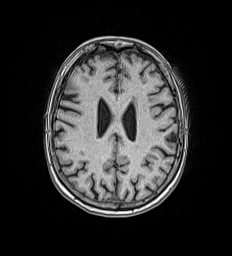
[im 120/160]
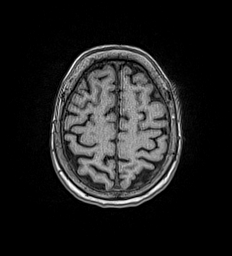
[im 140/160]
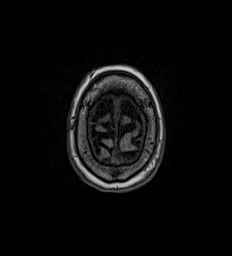
[im 160/160]
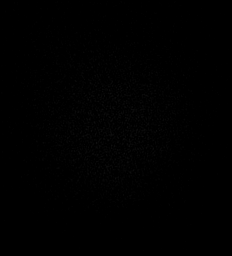

[Series 17: FLAIR · sagittal · 5.0mm · 0.94mm/px · 1 of 23 slices shown (2 of 2)]
[im 1/23]
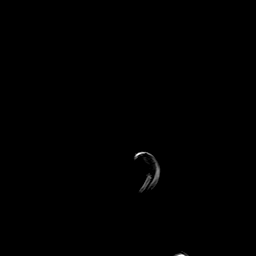

[Series 18: T2 post-contrast · coronal · 5.0mm · 0.57mm/px · 2 of 29 slices shown]
[im 1/29]
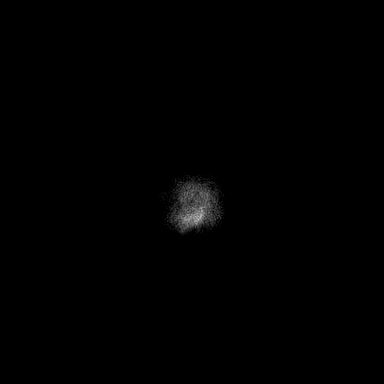
[im 29/29]
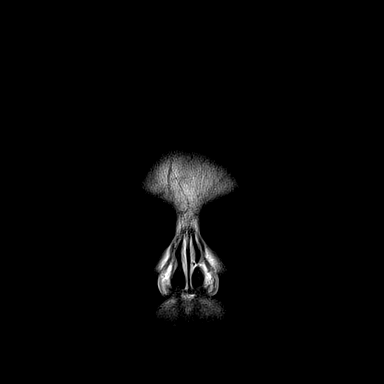

[Series 19: T1 post-contrast · axial · 1.0mm · 0.98mm/px · z∈[-101,+56]mm · 9 of 160 slices shown (1 of 3)]
[im 1/160]
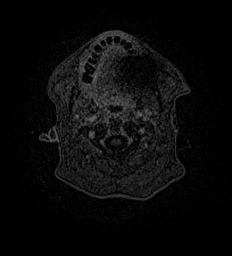
[im 20/160]
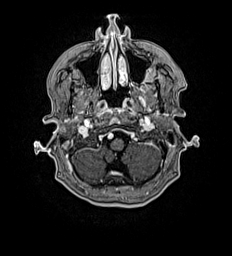
[im 40/160]
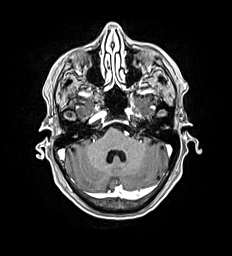
[im 60/160]
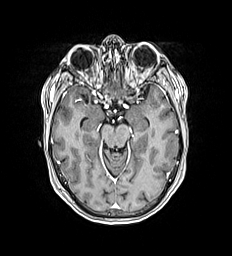
[im 80/160]
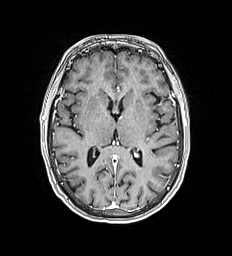
[im 100/160]
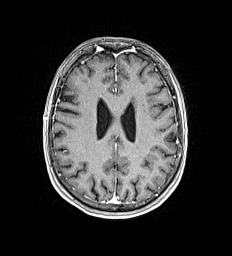
[im 120/160]
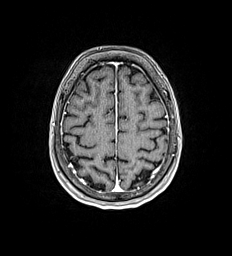
[im 140/160]
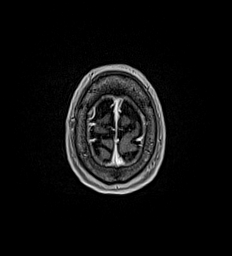
[im 160/160]
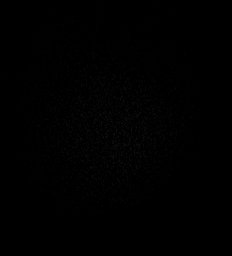

[Series 20: T1 post-contrast · coronal · 5.0mm · 0.57mm/px · 2 of 29 slices shown (2 of 3)]
[im 1/29]
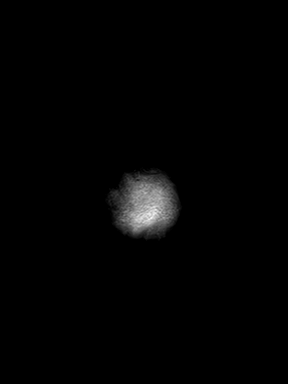
[im 29/29]
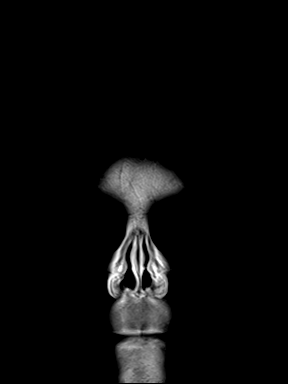

[Series 21: T1 post-contrast · sagittal · 5.0mm · 0.62mm/px · 1 of 23 slices shown (3 of 3)]
[im 1/23]
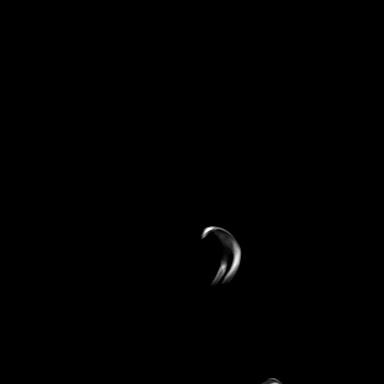

[48 of 48 positions shown; findings below may reference images not displayed]

FINDINGS: Brain:

Mild generalized cerebral and cerebellar atrophy.

Mild multifocal T2/FLAIR hyperintensity within the cerebral white
matter, nonspecific but compatible with chronic small vessel
ischemic disease.

There is no acute intracranial hemorrhage.

No demarcated cortical infarct.

No extra-axial fluid collection.

No evidence of an intracranial mass.

No midline shift.

No pathologic intracranial enhancement.

Vascular: Maintained flow voids within the proximal large arterial
vessels. Dominant right vertebral artery.

Skull and upper cervical spine: No focal suspicious marrow lesion.

Sinuses/Orbits: Visualized orbits show no acute finding. Bilateral
lens replacements. No significant paranasal sinus disease.
IMPRESSION: No evidence of acute intracranial abnormality.

Mild chronic small-vessel ischemic changes within the cerebral white
matter, stable as compared to the brain MRI of [DATE].

Mild generalized cerebral and cerebellar atrophy.

## 2021-07-16 IMAGING — MR MR CERVICAL SPINE WO/W CM
5 of 8 series · 27 of 48 positions shown · IV contrast (6ml Gadavist)
Comparison: No pertinent prior exams available for comparison.

CLINICAL DATA: Hyper reflexia. Additional history provided: Patient
reports weakness in right arm and leg, pain in legs for 1 month.

EXAM:
MRI CERVICAL SPINE WITHOUT AND WITH CONTRAST
TECHNIQUE: Multiplanar and multiecho pulse sequences of the cervical spine, to
include the craniocervical junction and cervicothoracic junction,
were obtained without and with intravenous contrast.
CONTRAST:  6mL GADAVIST GADOBUTROL 1 MMOL/ML IV SOLN

[Series 5: T2 · sagittal · 3.0mm · 0.62mm/px · 4 of 15 slices shown (1 of 2)]
[im 1/15]
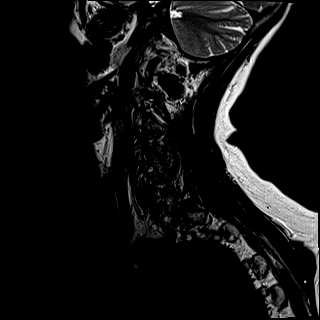
[im 5/15]
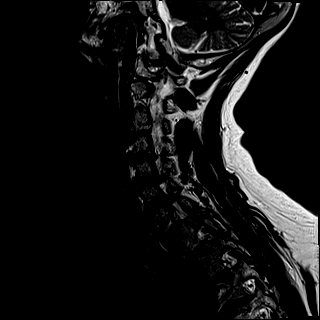
[im 10/15]
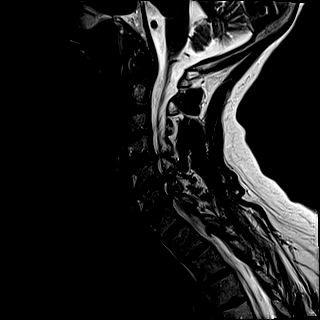
[im 15/15]
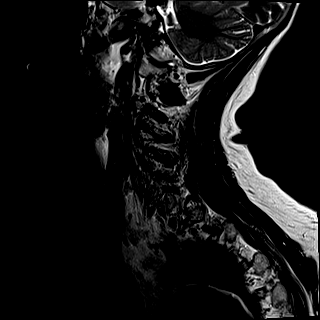

[Series 7: STIR · sagittal · 3.0mm · 0.62mm/px · 4 of 15 slices shown]
[im 1/15]
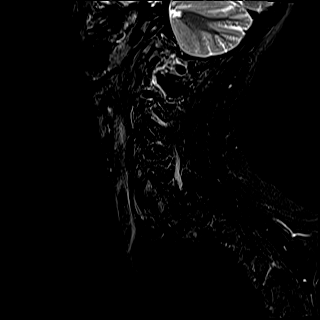
[im 5/15]
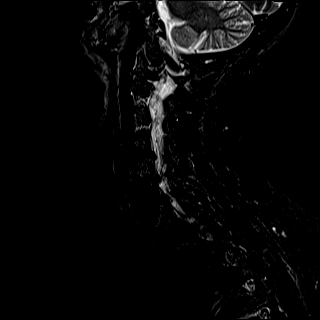
[im 10/15]
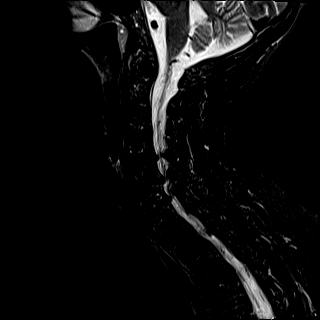
[im 15/15]
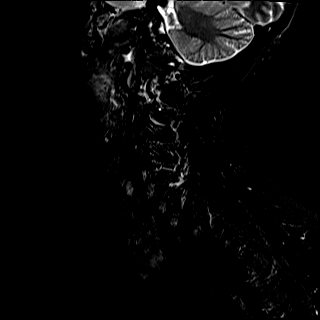

[Series 8: T2 · axial · 3.0mm · 0.70mm/px · z∈[-259,-161]mm · 8 of 29 slices shown (2 of 2)]
[im 1/29]
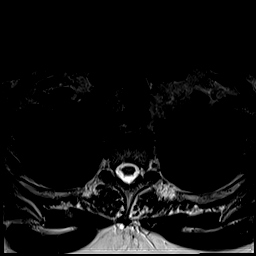
[im 5/29]
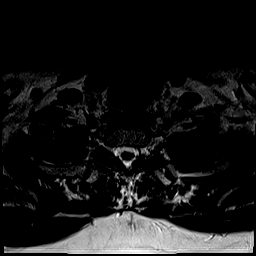
[im 9/29]
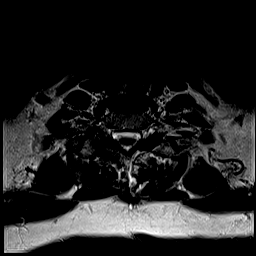
[im 13/29]
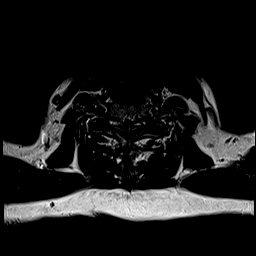
[im 17/29]
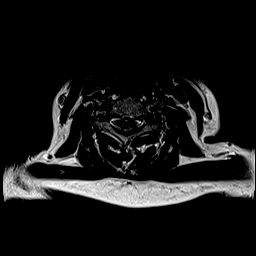
[im 21/29]
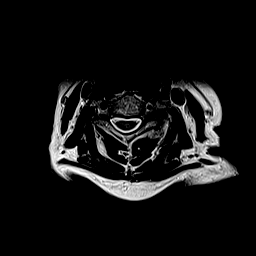
[im 25/29]
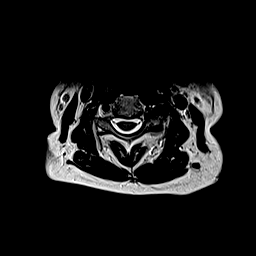
[im 29/29]
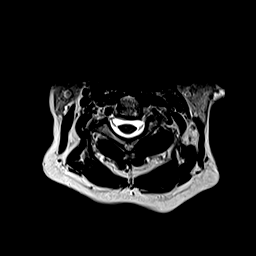

[Series 10: T1 · axial · non-contrast · 3.0mm · 0.35mm/px · z∈[-259,-161]mm · 8 of 29 slices shown]
[im 1/29]
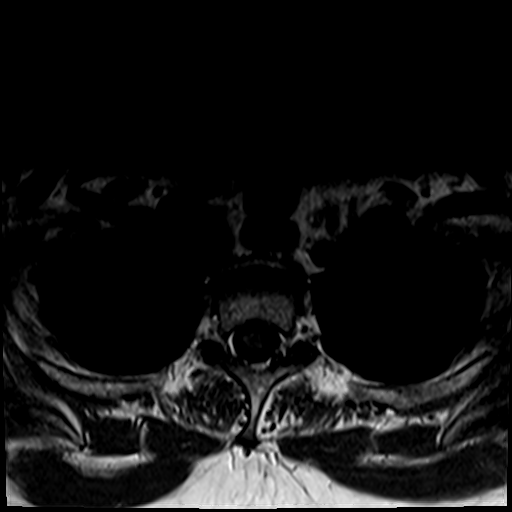
[im 5/29]
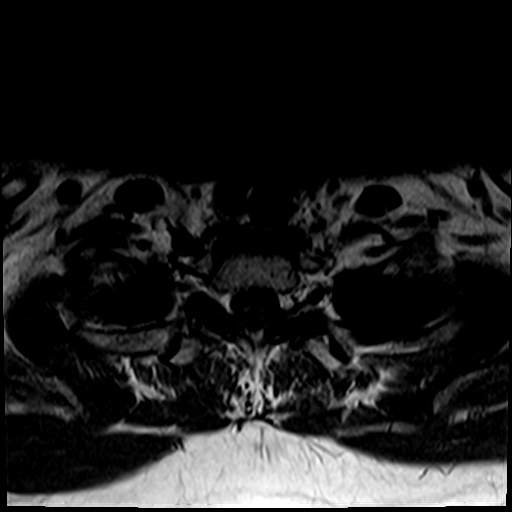
[im 9/29]
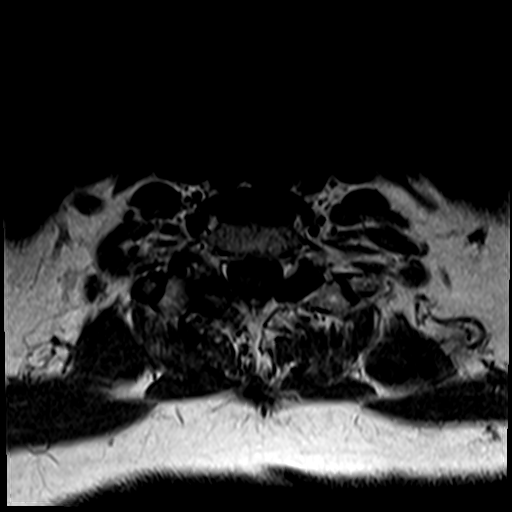
[im 13/29]
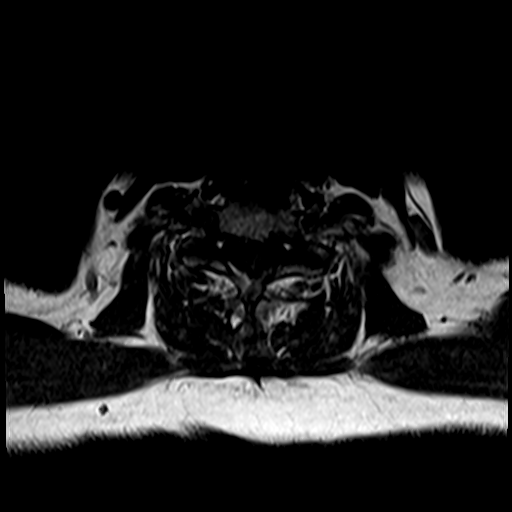
[im 17/29]
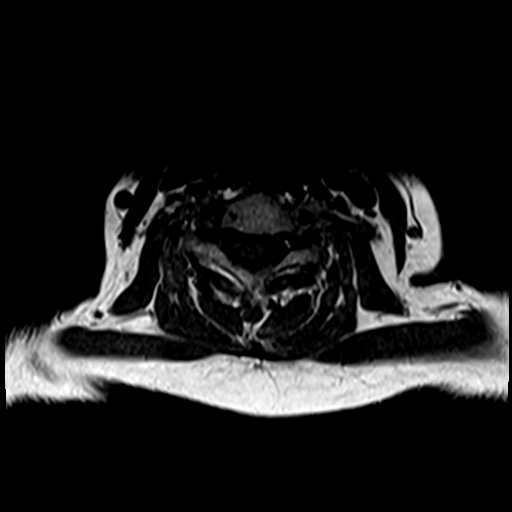
[im 21/29]
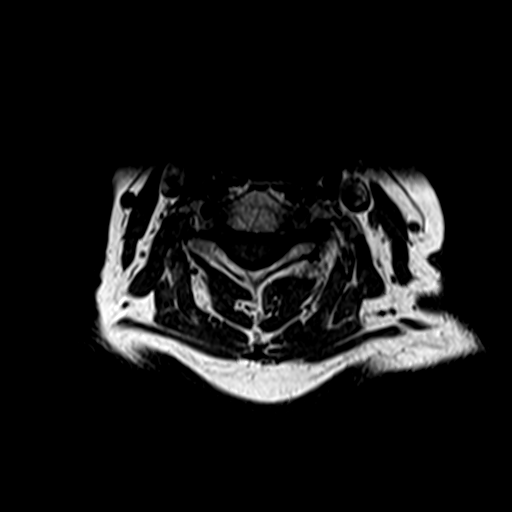
[im 25/29]
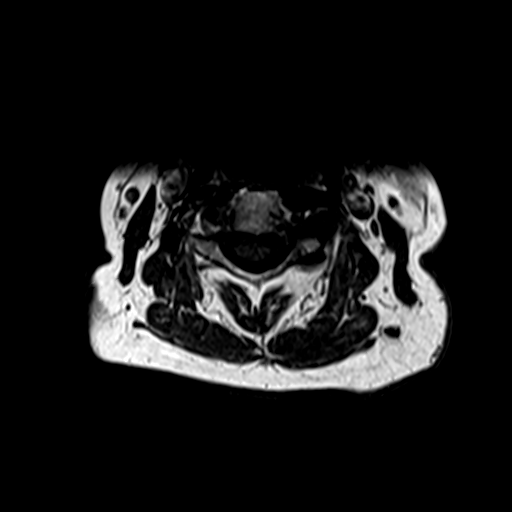
[im 29/29]
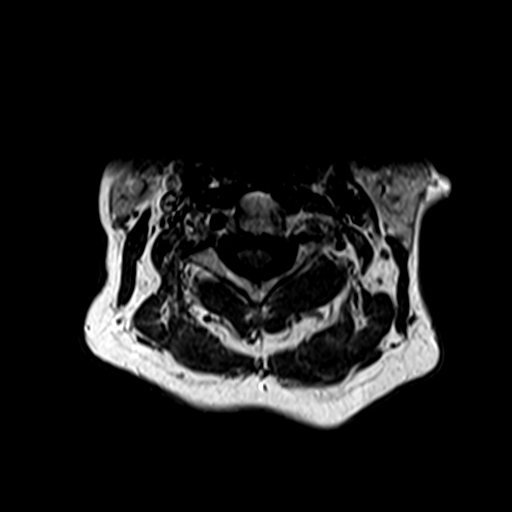

[Series 12: T1 post-contrast · axial · 3.0mm · 0.35mm/px · z∈[-259,-231]mm · 3 of 29 slices shown]
[im 1/29]
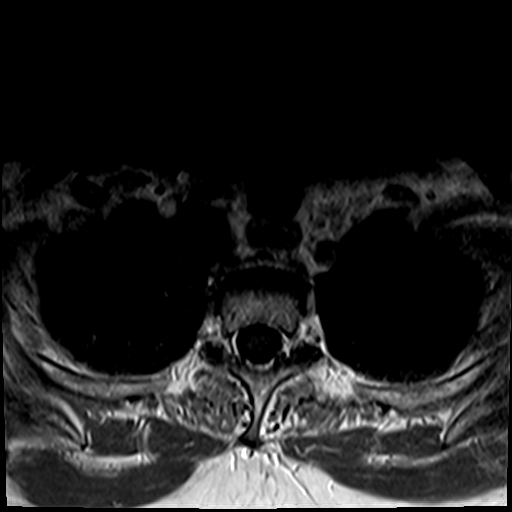
[im 5/29]
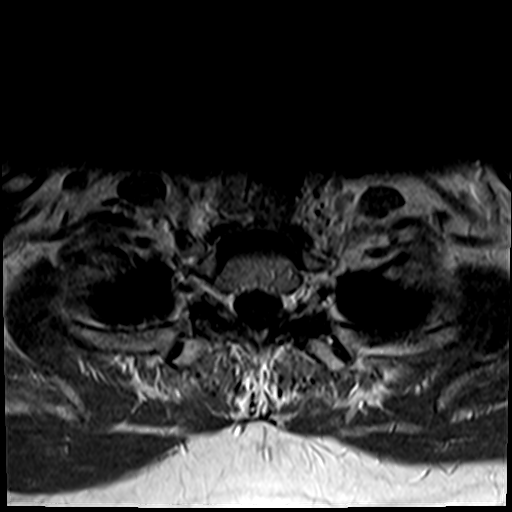
[im 9/29]
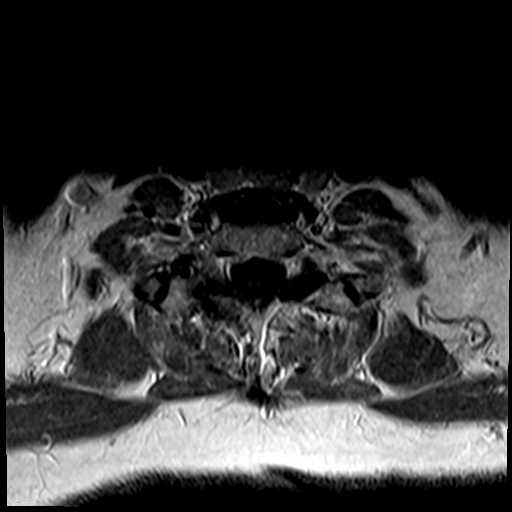

[27 of 48 positions shown; findings below may reference images not displayed]

FINDINGS: Alignment: Trace C2-C3, C3-C4, C7-T1, T1-T2 and T2-T3 grade 1
anterolisthesis.

Vertebrae: Vertebral body height is maintained. Mild edema and
enhancement within the dens, as well as within the left C3 and C4
articular pillars, likely degenerative. Multilevel degenerative
endplate irregularity. Mild degenerative endplate edema and
enhancement at C4-C5, C5-C6 and C6-C7. Small multilevel ventral
osteophytes.

Cord: Multilevel spinal cord flattening, as described below. Spinal
cord signal abnormality at C6-C7, also described below. No abnormal
spinal cord enhancement.

Posterior Fossa, vertebral arteries, paraspinal tissues: No
abnormality identified within included portions of the posterior
fossa. Flow voids preserved within the imaged cervical vertebral
arteries. Paraspinal soft tissues unremarkable.

Disc levels:

Multilevel disc degeneration. Most notably, moderate disc
degeneration is present at C4-C5, C5-C6 and C6-C7, as well as T3-T4.

C2-C3: Trace grade 1 anterolisthesis. No significant disc herniation
or spinal canal stenosis. Facet arthrosis on the left with mild
relative left neural foraminal narrowing.

C3-C4: Trace grade 1 anterolisthesis. Disc bulge. Facet arthrosis
(advanced on the left). The disc bulge results in mild spinal canal
stenosis and contacts the ventral spinal cord. Moderate left neural
foraminal narrowing.

C4-C5: Disc bulge with bilateral disc osteophyte ridge/uncinate
hypertrophy. Facet arthrosis (advanced on the left). Ligamentum
flavum hypertrophy. Mild spinal canal stenosis. The disc bulge
contacts and minimally flattens the ventral spinal cord. Moderate
bilateral neural foraminal narrowing.

C5-C6: Posterior disc osteophyte complex with bilateral disc
osteophyte ridge/uncinate hypertrophy. Fairly advanced facet
arthrosis with ligamentum flavum hypertrophy. Severe spinal canal
stenosis with significant spinal cord flattening. No definite spinal
cord signal abnormality. Severe bilateral neural foraminal
narrowing.

C6-C7: Disc bulge with bilateral disc osteophyte ridge/uncinate
hypertrophy. Facet arthrosis with ligamentum flavum hypertrophy.
Marked spinal cord flattening. T2 hyperintense signal abnormality
within the spinal cord at this level compatible with edema and/or
myelomalacia. Severe bilateral neural foraminal narrowing.

C7-T1: Trace grade 1 anterolisthesis. Facet arthrosis. No
significant disc herniation or stenosis.

Impressions 2 and 3 will be called to the ordering clinician or
representative by the Radiologist Assistant, and communication
documented in the PACS or [REDACTED].
IMPRESSION: Cervical spondylosis, as outlined and with findings most notably as
follows.

At C6-C7, there is moderate disc degeneration. Multifactorial severe
spinal canal stenosis with marked spinal cord flattening. Signal
abnormality within the spinal cord at this level compatible with
focal edema and/or myelomalacia. Severe bilateral neural foraminal
narrowing.

At C5-C6, there is moderate disc degeneration. Multifactorial severe
spinal canal stenosis with significant spinal cord flattening.
Severe bilateral neural foraminal narrowing.

At C4-C5, there is moderate disc degeneration. A disc bulge, with
associated bilateral disc osteophyte ridge/uncinate hypertrophy,
contributes to mild spinal canal stenosis with minimal flattening of
the ventral spinal cord. Moderate bilateral neural foraminal
narrowing.

No more than mild spinal canal narrowing at the remaining levels.
Additional sites of foraminal stenosis, as detailed and greatest on
the left at C3-C4 (moderate at this site).

Degenerative edema and enhancement within the dens, as well as left
C3 and C4 articular pillars. Mild multilevel degenerative endplate
edema and enhancement.

Mild multilevel grade 1 spondylolisthesis, as described.

## 2021-07-16 MED ORDER — GADOBUTROL 1 MMOL/ML IV SOLN
6.0000 mL | Freq: Once | INTRAVENOUS | Status: AC | PRN
  Administered 2021-07-16: 6 mL via INTRAVENOUS

## 2021-08-14 ENCOUNTER — Other Ambulatory Visit: Payer: Self-pay | Admitting: Internal Medicine

## 2021-08-14 DIAGNOSIS — Z1231 Encounter for screening mammogram for malignant neoplasm of breast: Secondary | ICD-10-CM

## 2021-08-28 ENCOUNTER — Other Ambulatory Visit: Payer: Self-pay | Admitting: Neurosurgery

## 2021-09-02 ENCOUNTER — Ambulatory Visit
Admission: RE | Admit: 2021-09-02 | Discharge: 2021-09-02 | Disposition: A | Discharge status: Home / Self Care | Payer: Medicare HMO | Source: Ambulatory Visit | Attending: Internal Medicine | Admitting: Internal Medicine

## 2021-09-02 ENCOUNTER — Other Ambulatory Visit: Payer: Self-pay

## 2021-09-02 DIAGNOSIS — Z1231 Encounter for screening mammogram for malignant neoplasm of breast: Secondary | ICD-10-CM | POA: Diagnosis not present

## 2021-09-02 IMAGING — MG MM DIGITAL SCREENING BILAT W/ TOMO AND CAD
8 series · 8 of 24 positions shown · non-contrast
Comparison: Previous exam(s).

CLINICAL DATA: Screening.

EXAM:
DIGITAL SCREENING BILATERAL MAMMOGRAM WITH TOMOSYNTHESIS AND CAD
TECHNIQUE: Bilateral screening digital craniocaudal and mediolateral oblique
mammograms were obtained. Bilateral screening digital breast
tomosynthesis was performed. The images were evaluated with
computer-aided detection.

[L CC synth-2D]
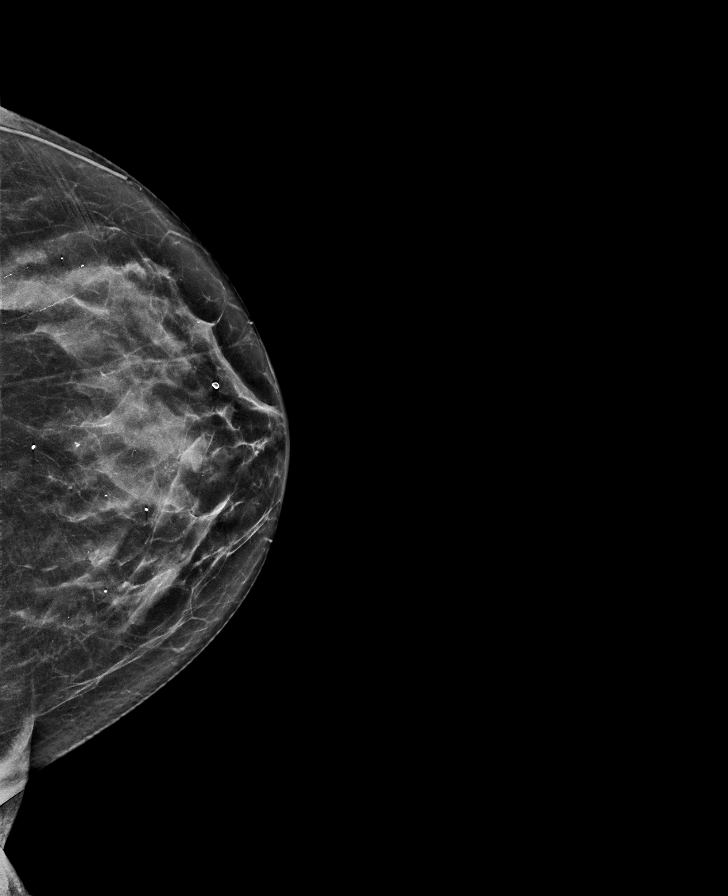

[R CC synth-2D]
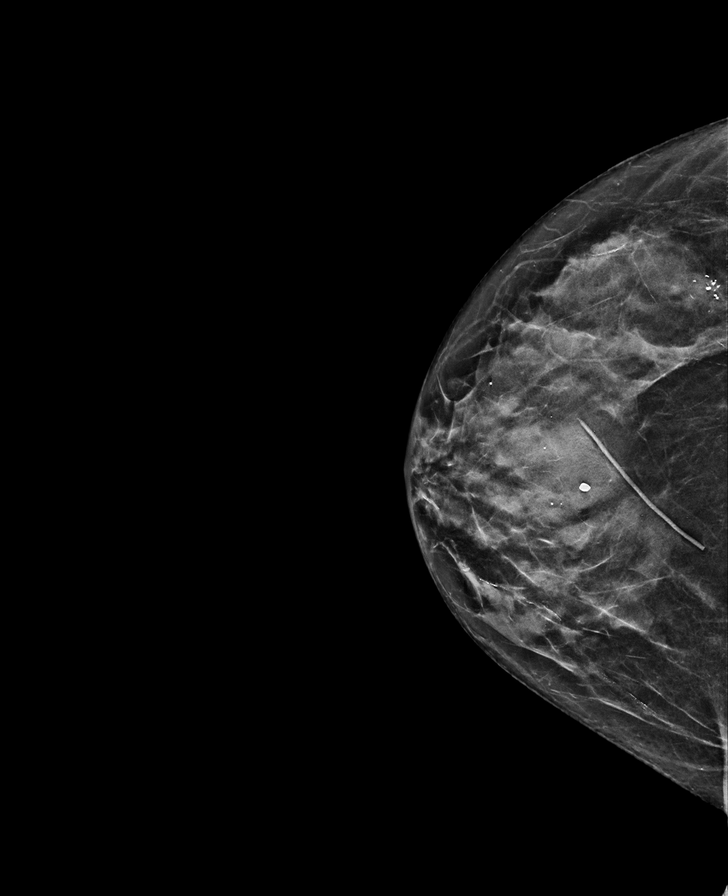

[R MLO synth-2D]
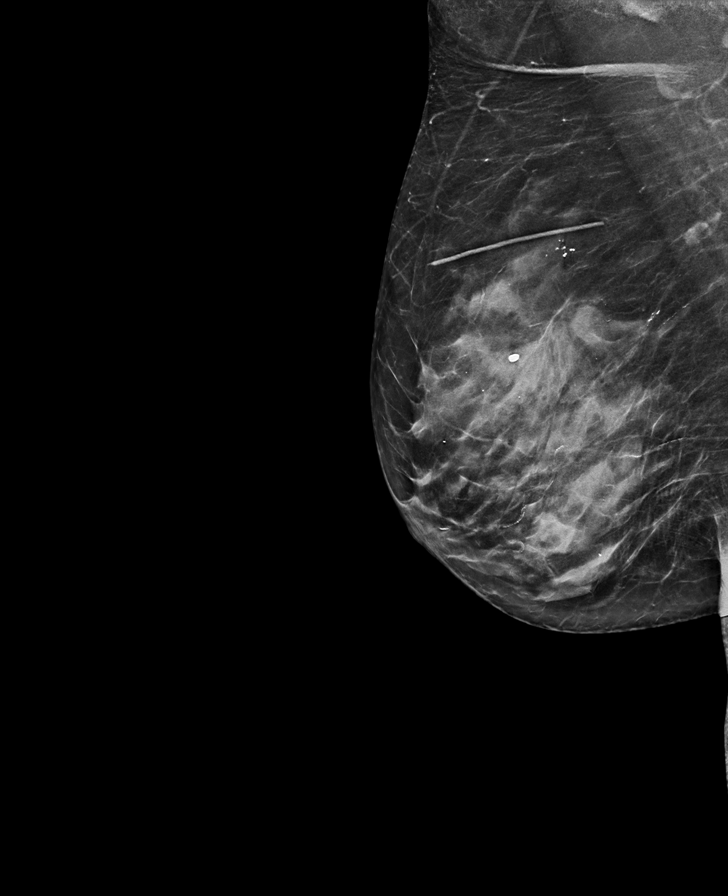

[L MLO synth-2D]
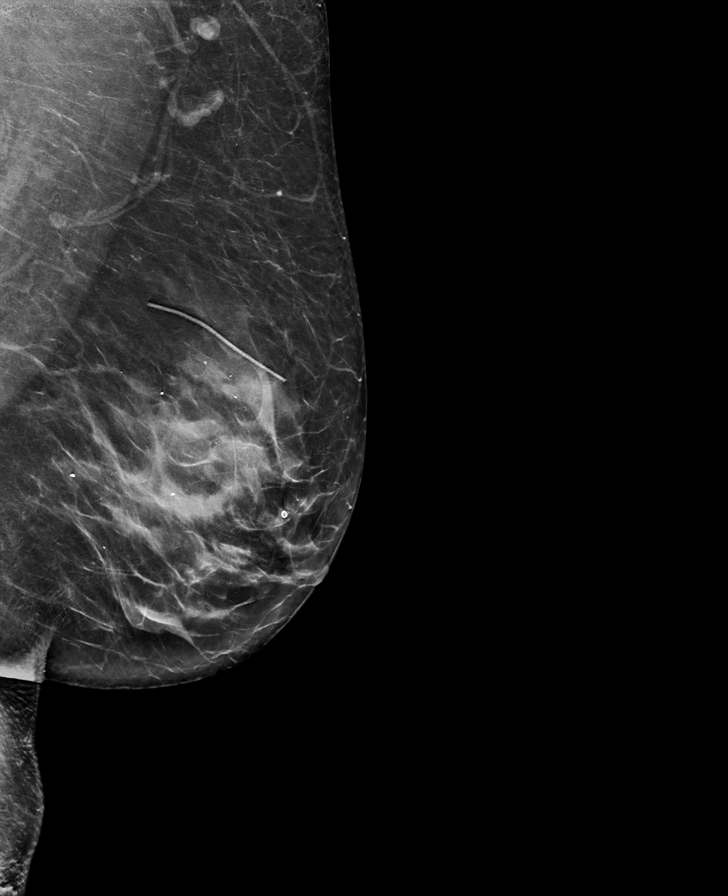

[R CC tomo · tomo slice 29/57.0]
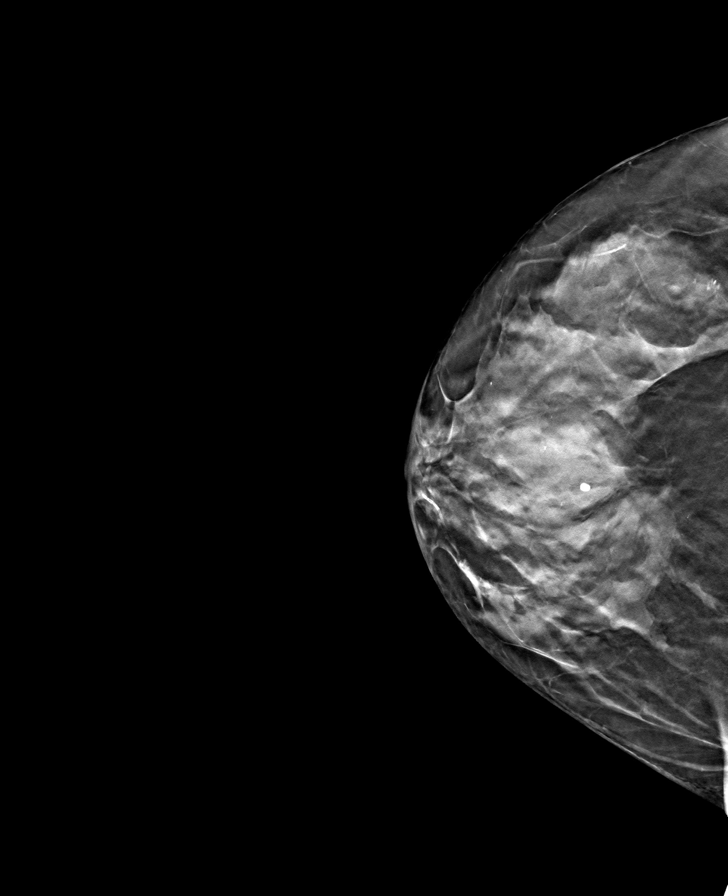

[R MLO tomo · tomo slice 33/64.0]
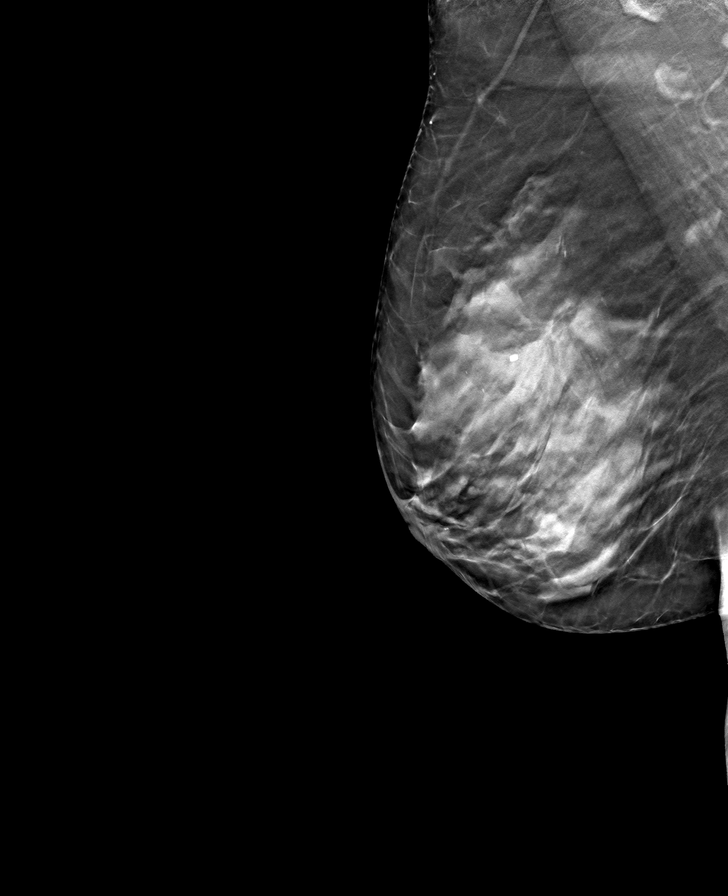

[L CC tomo · tomo slice 33/64.0]
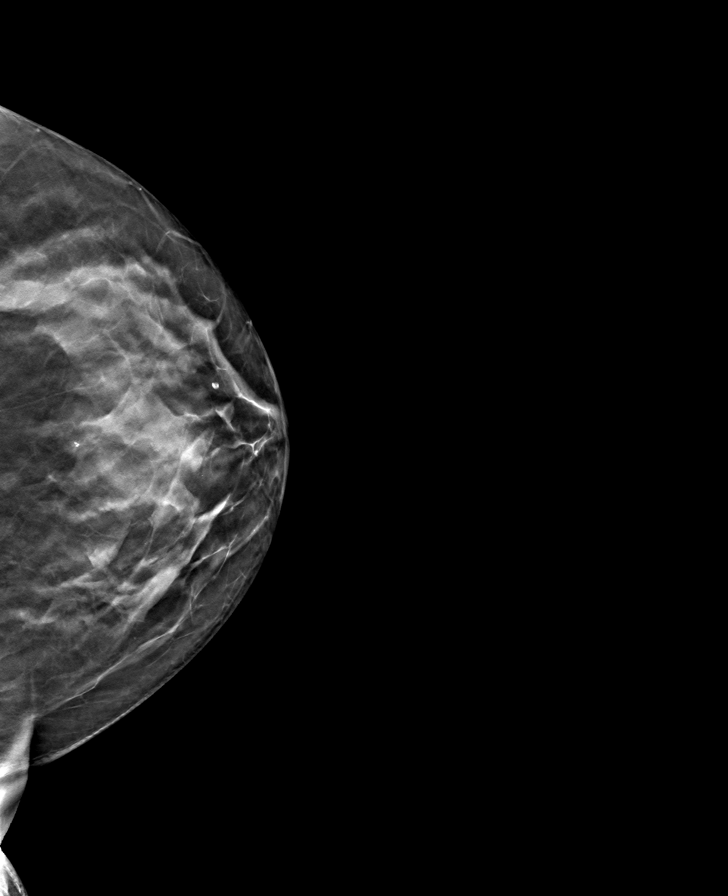

[L MLO tomo · tomo slice 37/72.0]
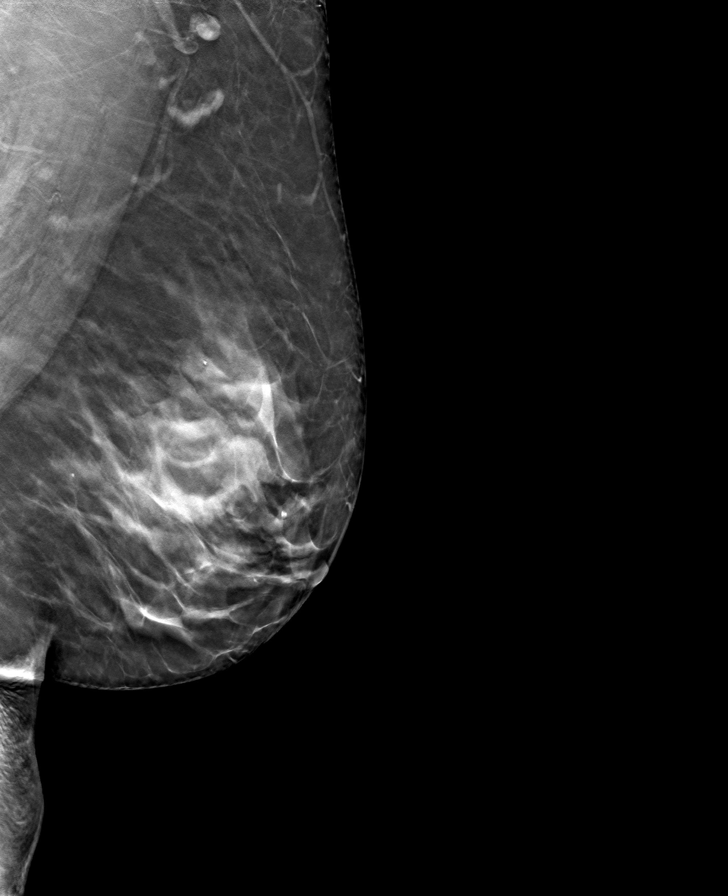

[8 of 24 positions shown; findings below may reference images not displayed]

ACR Breast Density Category c: The breast tissue is heterogeneously
dense, which may obscure small masses.
FINDINGS: There are no findings suspicious for malignancy.
IMPRESSION: No mammographic evidence of malignancy. A result letter of this
screening mammogram will be mailed directly to the patient.

RECOMMENDATION:
Screening mammogram in one year. (Code:[V2])

BI-RADS CATEGORY  1: Negative.

## 2021-09-10 ENCOUNTER — Other Ambulatory Visit: Payer: Self-pay

## 2021-09-10 ENCOUNTER — Other Ambulatory Visit
Admission: RE | Admit: 2021-09-10 | Discharge: 2021-09-10 | Disposition: A | Discharge status: Home / Self Care | Payer: Medicare HMO | Source: Ambulatory Visit | Attending: Neurosurgery | Admitting: Neurosurgery

## 2021-09-10 DIAGNOSIS — I10 Essential (primary) hypertension: Secondary | ICD-10-CM | POA: Diagnosis not present

## 2021-09-10 DIAGNOSIS — Z01818 Encounter for other preprocedural examination: Secondary | ICD-10-CM | POA: Insufficient documentation

## 2021-09-10 HISTORY — DX: Personal history of Methicillin resistant Staphylococcus aureus infection: Z86.14

## 2021-09-10 HISTORY — DX: Anxiety disorder, unspecified: F41.9

## 2021-09-10 HISTORY — DX: Mild cognitive impairment of uncertain or unknown etiology: G31.84

## 2021-09-10 HISTORY — DX: Arterial fibromuscular dysplasia: I77.3

## 2021-09-10 LAB — CBC
HCT: 36.9 % (ref 36.0–46.0)
Hemoglobin: 12.2 g/dL (ref 12.0–15.0)
MCH: 31.6 pg (ref 26.0–34.0)
MCHC: 33.1 g/dL (ref 30.0–36.0)
MCV: 95.6 fL (ref 80.0–100.0)
Platelets: 199 10*3/uL (ref 150–400)
RBC: 3.86 MIL/uL — ABNORMAL LOW (ref 3.87–5.11)
RDW: 13.3 % (ref 11.5–15.5)
WBC: 6.6 10*3/uL (ref 4.0–10.5)
nRBC: 0 % (ref 0.0–0.2)

## 2021-09-10 LAB — URINALYSIS, ROUTINE W REFLEX MICROSCOPIC
Bacteria, UA: NONE SEEN
Bilirubin Urine: NEGATIVE
Glucose, UA: NEGATIVE mg/dL
Hgb urine dipstick: NEGATIVE
Ketones, ur: NEGATIVE mg/dL
Nitrite: NEGATIVE
Protein, ur: NEGATIVE mg/dL
Specific Gravity, Urine: 1.008 (ref 1.005–1.030)
pH: 9 — ABNORMAL HIGH (ref 5.0–8.0)

## 2021-09-10 LAB — BASIC METABOLIC PANEL
Anion gap: 5 (ref 5–15)
BUN: 15 mg/dL (ref 8–23)
CO2: 29 mmol/L (ref 22–32)
Calcium: 9.5 mg/dL (ref 8.9–10.3)
Chloride: 104 mmol/L (ref 98–111)
Creatinine, Ser: 0.8 mg/dL (ref 0.44–1.00)
GFR, Estimated: >60 mL/min (ref >60)
Glucose, Bld: 89 mg/dL (ref 70–99)
Potassium: 3.9 mmol/L (ref 3.5–5.1)
Sodium: 138 mmol/L (ref 135–145)

## 2021-09-10 LAB — SURGICAL PCR SCREEN
MRSA, PCR: NEGATIVE
Staphylococcus aureus: NEGATIVE

## 2021-09-10 LAB — TYPE AND SCREEN
ABO/RH(D): O POS
Antibody Screen: NEGATIVE

## 2021-09-10 LAB — PROTIME-INR
INR: 1 (ref 0.8–1.2)
Prothrombin Time: 13.3 seconds (ref 11.4–15.2)

## 2021-09-10 LAB — APTT: aPTT: 29 seconds (ref 24–36)

## 2021-09-10 NOTE — Patient Instructions (Addendum)
Your procedure is scheduled on:09-21-21 Monday Report to the Registration Desk on the 1st floor of the [facility].Then proceed to the 2nd floor Surgery Desk in the Medical [facility] To find out your arrival time, please call [phone] between 1PM - 3PM on:09-18-21 Friday  REMEMBER: Instructions that are not followed completely may result in serious medical risk, up to and including death; or upon the discretion of your surgeon and anesthesiologist your surgery may need to be rescheduled.  Do not eat food after midnight the night before surgery.  No gum chewing, lozengers or hard candies.  You may however, drink CLEAR liquids up to 2 hours before you are scheduled to arrive for your surgery. Do not drink anything within 2 hours of your scheduled arrival time.  Clear liquids include: - water  - apple juice without pulp - gatorade (not RED, PURPLE, OR BLUE) - black coffee or tea (Do NOT add milk or creamers to the coffee or tea) Do NOT drink anything that is not on this list.  TAKE THESE MEDICATIONS THE MORNING OF SURGERY WITH A SIP OF WATER: -gabapentin (NEURONTIN) 100 MG capsule  Stop ASPIRIN LOW DOSE 81 MG EC tablet 7 days prior to surgery per Dr Kimberlee Nearing dose on 09-13-21 Sunday  One week prior to surgery: Stop Anti-inflammatories (NSAIDS) such as Advil, Aleve, Ibuprofen, Motrin, Naproxen, Naprosyn and Aspirin based products such as Excedrin Migraine, Goodys Powder, BC Powder.You may however, take Tylenol if needed for pain up until the day of surgery.  Stop ANY OVER THE COUNTER supplements/vitamins 7 days prior to surgery (Ascorbic Acid (VITAMIN C) 1000 MG tablet, Cyanocobalamin (VITAMIN B-12 PO), magnesium oxide (MAG-OX) 400 MG tablet,Multiple Vitamins-Minerals(MULTIVITAMIN WITH MINERALS) tablet, zinc gluconate 50 MG tablet)-Ok to continue Melatonin 5 MG CAPS if needed  No Alcohol for 24 hours before or after surgery.  No Smoking including e-cigarettes for 24 hours prior to  surgery.  No chewable tobacco products for at least 6 hours prior to surgery.  No nicotine patches on the day of surgery.  Do not use any "recreational" drugs for at least a week prior to your surgery.  Please be advised that the combination of cocaine and anesthesia may have negative outcomes, up to and including death. If you test positive for cocaine, your surgery will be cancelled.  On the morning of surgery brush your teeth with toothpaste and water, you may rinse your mouth with mouthwash if you wish. Do not swallow any toothpaste or mouthwash.  Use CHG Soap as directed on instruction sheet.  Do not wear jewelry, make-up, hairpins, clips or nail polish.  Do not wear lotions, powders, or perfumes.   Do not shave body from the neck down 48 hours prior to surgery just in case you cut yourself which could leave a site for infection.  Also, freshly shaved skin may become irritated if using the CHG soap.  Contact lenses, hearing aids and dentures may not be worn into surgery.  Do not bring valuables to the hospital. [facility] is not responsible for any missing/lost belongings or valuables.   Notify your doctor if there is any change in your medical condition (cold, fever, infection).  Wear comfortable clothing (specific to your surgery type) to the hospital.  After surgery, you can help prevent lung complications by doing breathing exercises.  Take deep breaths and cough every 1-2 hours. Your doctor may order a device called an Incentive Spirometer to help you take deep breaths. When coughing or sneezing, hold a  pillow firmly against your incision with both hands. This is called "splinting." Doing this helps protect your incision. It also decreases belly discomfort.  If you are being admitted to the hospital overnight, leave your suitcase in the car. After surgery it may be brought to your room.  If you are being discharged the day of surgery, you will not be allowed to drive  home. You will need a responsible adult (18 years or older) to drive you home and stay with you that night.   If you are taking public transportation, you will need to have a responsible adult (18 years or older) with you. Please confirm with your physician that it is acceptable to use public transportation.   Please call the Pre-admissions Testing Dept. at [phone] if you have any questions about these instructions.  Surgery Visitation Policy:  Patients undergoing a surgery or procedure may have one family member or support person with them as long as that person is not COVID-19 positive or experiencing its symptoms.  That person may remain in the waiting area during the procedure and may rotate out with other people.  Inpatient Visitation:    Visiting hours are 7 a.m. to 8 p.m. Up to two visitors ages 16+ are allowed at one time in a patient room. The visitors may rotate out with other people during the day. Visitors must check out when they leave, or other visitors will not be allowed. One designated support person may remain overnight. The visitor must pass COVID-19 screenings, use hand sanitizer when entering and exiting the patient's room and wear a mask at all times, including in the patient's room. Patients must also wear a mask when staff or their visitor are in the room. Masking is required regardless of vaccination status.

## 2021-09-17 ENCOUNTER — Other Ambulatory Visit: Payer: Self-pay

## 2021-09-17 ENCOUNTER — Other Ambulatory Visit
Admission: RE | Admit: 2021-09-17 | Discharge: 2021-09-17 | Disposition: A | Discharge status: Home / Self Care | Payer: Medicare HMO | Source: Ambulatory Visit | Attending: Neurosurgery | Admitting: Neurosurgery

## 2021-09-17 DIAGNOSIS — Z01812 Encounter for preprocedural laboratory examination: Secondary | ICD-10-CM | POA: Diagnosis present

## 2021-09-17 DIAGNOSIS — Z20822 Contact with and (suspected) exposure to covid-19: Secondary | ICD-10-CM

## 2021-09-18 LAB — SARS CORONAVIRUS 2 (TAT 6-24 HRS): SARS Coronavirus 2: NEGATIVE

## 2021-09-21 ENCOUNTER — Encounter: Payer: Self-pay | Admitting: Neurosurgery

## 2021-09-21 ENCOUNTER — Other Ambulatory Visit: Payer: Self-pay

## 2021-09-21 ENCOUNTER — Ambulatory Visit: Payer: Medicare HMO

## 2021-09-21 ENCOUNTER — Observation Stay
Admission: RE | Admit: 2021-09-21 | Discharge: 2021-09-22 | Disposition: A | Discharge status: Home / Self Care | Payer: Medicare HMO | Source: Ambulatory Visit | Attending: Neurosurgery | Admitting: Neurosurgery

## 2021-09-21 ENCOUNTER — Ambulatory Visit: Payer: Medicare HMO | Admitting: Urgent Care

## 2021-09-21 ENCOUNTER — Encounter
Admission: RE | Disposition: A | Discharge status: Home / Self Care | Payer: Self-pay | Source: Ambulatory Visit | Attending: Neurosurgery

## 2021-09-21 ENCOUNTER — Ambulatory Visit: Payer: Medicare HMO | Admitting: Certified Registered Nurse Anesthetist

## 2021-09-21 DIAGNOSIS — M50021 Cervical disc disorder at C4-C5 level with myelopathy: Secondary | ICD-10-CM | POA: Insufficient documentation

## 2021-09-21 DIAGNOSIS — I10 Essential (primary) hypertension: Secondary | ICD-10-CM | POA: Insufficient documentation

## 2021-09-21 DIAGNOSIS — M50023 Cervical disc disorder at C6-C7 level with myelopathy: Secondary | ICD-10-CM | POA: Insufficient documentation

## 2021-09-21 DIAGNOSIS — Z79899 Other long term (current) drug therapy: Secondary | ICD-10-CM | POA: Insufficient documentation

## 2021-09-21 DIAGNOSIS — M50022 Cervical disc disorder at C5-C6 level with myelopathy: Secondary | ICD-10-CM | POA: Diagnosis not present

## 2021-09-21 DIAGNOSIS — M5001 Cervical disc disorder with myelopathy,  high cervical region: Secondary | ICD-10-CM | POA: Diagnosis not present

## 2021-09-21 DIAGNOSIS — M4802 Spinal stenosis, cervical region: Principal | ICD-10-CM | POA: Insufficient documentation

## 2021-09-21 DIAGNOSIS — Z8673 Personal history of transient ischemic attack (TIA), and cerebral infarction without residual deficits: Secondary | ICD-10-CM | POA: Insufficient documentation

## 2021-09-21 DIAGNOSIS — G959 Disease of spinal cord, unspecified: Secondary | ICD-10-CM | POA: Diagnosis present

## 2021-09-21 DIAGNOSIS — Z96652 Presence of left artificial knee joint: Secondary | ICD-10-CM | POA: Diagnosis not present

## 2021-09-21 DIAGNOSIS — Z419 Encounter for procedure for purposes other than remedying health state, unspecified: Secondary | ICD-10-CM

## 2021-09-21 HISTORY — PX: POSTERIOR CERVICAL LAMINECTOMY: SHX2248

## 2021-09-21 SURGERY — POSTERIOR CERVICAL LAMINECTOMY
Anesthesia: General | Site: Neck

## 2021-09-21 MED ORDER — OXYCODONE HCL 5 MG PO TABS
ORAL_TABLET | ORAL | Status: AC
  Filled 2021-09-21: qty 1

## 2021-09-21 MED ORDER — DONEPEZIL HCL 5 MG PO TABS
5.0000 mg | ORAL_TABLET | Freq: Every day | ORAL | Status: DC
  Administered 2021-09-21: 5 mg via ORAL
  Filled 2021-09-21 (×2): qty 1

## 2021-09-21 MED ORDER — ONDANSETRON HCL 4 MG/2ML IJ SOLN: 4.0000 mg | Freq: Once | INTRAMUSCULAR | Status: DC | PRN

## 2021-09-21 MED ORDER — MIDAZOLAM HCL 2 MG/2ML IJ SOLN
INTRAMUSCULAR | Status: DC | PRN
  Administered 2021-09-21: 2 mg via INTRAVENOUS

## 2021-09-21 MED ORDER — OXYCODONE HCL 5 MG PO TABS
ORAL_TABLET | ORAL | Status: AC
  Administered 2021-09-21: 5 mg via ORAL
  Filled 2021-09-21: qty 1

## 2021-09-21 MED ORDER — BISACODYL 5 MG PO TBEC
5.0000 mg | DELAYED_RELEASE_TABLET | Freq: Every day | ORAL | Status: DC | PRN
  Filled 2021-09-21: qty 1

## 2021-09-21 MED ORDER — SODIUM CHLORIDE 0.9 % IV SOLN: 250.0000 mL | INTRAVENOUS | Status: DC

## 2021-09-21 MED ORDER — SUCCINYLCHOLINE CHLORIDE 200 MG/10ML IV SOSY
PREFILLED_SYRINGE | INTRAVENOUS | Status: AC
  Filled 2021-09-21: qty 20

## 2021-09-21 MED ORDER — BACITRACIN 500 UNIT/GM EX OINT
TOPICAL_OINTMENT | CUTANEOUS | Status: DC | PRN
  Administered 2021-09-21: 1 via TOPICAL

## 2021-09-21 MED ORDER — CHLORHEXIDINE GLUCONATE 0.12 % MT SOLN
OROMUCOSAL | Status: AC
  Administered 2021-09-21: 15 mL via OROMUCOSAL
  Filled 2021-09-21: qty 15

## 2021-09-21 MED ORDER — OXYCODONE HCL 5 MG PO TABS: 10.0000 mg | ORAL_TABLET | ORAL | Status: DC | PRN

## 2021-09-21 MED ORDER — MIDAZOLAM HCL 2 MG/2ML IJ SOLN
INTRAMUSCULAR | Status: AC
  Filled 2021-09-21: qty 2

## 2021-09-21 MED ORDER — MELATONIN 5 MG PO TABS
5.0000 mg | ORAL_TABLET | Freq: Every evening | ORAL | Status: DC | PRN
  Filled 2021-09-21: qty 1

## 2021-09-21 MED ORDER — FLEET ENEMA 7-19 GM/118ML RE ENEM: 1.0000 | ENEMA | Freq: Once | RECTAL | Status: DC | PRN

## 2021-09-21 MED ORDER — FENTANYL CITRATE (PF) 100 MCG/2ML IJ SOLN
INTRAMUSCULAR | Status: AC
  Filled 2021-09-21: qty 2

## 2021-09-21 MED ORDER — PHENYLEPHRINE HCL-NACL 20-0.9 MG/250ML-% IV SOLN
INTRAVENOUS | Status: DC | PRN
  Administered 2021-09-21: 30 ug/min via INTRAVENOUS

## 2021-09-21 MED ORDER — PHENOL 1.4 % MT LIQD
1.0000 | OROMUCOSAL | Status: DC | PRN
  Filled 2021-09-21: qty 177

## 2021-09-21 MED ORDER — KETOROLAC TROMETHAMINE 15 MG/ML IJ SOLN
INTRAMUSCULAR | Status: AC
  Administered 2021-09-21: 15 mg via INTRAVENOUS
  Filled 2021-09-21: qty 1

## 2021-09-21 MED ORDER — 0.9 % SODIUM CHLORIDE (POUR BTL) OPTIME
TOPICAL | Status: DC | PRN
  Administered 2021-09-21: 1000 mL

## 2021-09-21 MED ORDER — ONDANSETRON HCL 4 MG/2ML IJ SOLN
INTRAMUSCULAR | Status: DC | PRN
  Administered 2021-09-21: 4 mg via INTRAVENOUS

## 2021-09-21 MED ORDER — SODIUM CHLORIDE 0.9 % IV SOLN: INTRAVENOUS | Status: DC

## 2021-09-21 MED ORDER — BACITRACIN ZINC 500 UNIT/GM EX OINT
TOPICAL_OINTMENT | CUTANEOUS | Status: AC
  Filled 2021-09-21: qty 28.35

## 2021-09-21 MED ORDER — FAMOTIDINE 20 MG PO TABS
ORAL_TABLET | ORAL | Status: AC
  Administered 2021-09-21: 20 mg via ORAL
  Filled 2021-09-21: qty 1

## 2021-09-21 MED ORDER — CEFAZOLIN SODIUM-DEXTROSE 2-4 GM/100ML-% IV SOLN
2.0000 g | INTRAVENOUS | Status: AC
  Administered 2021-09-21: 2 g via INTRAVENOUS

## 2021-09-21 MED ORDER — GABAPENTIN 100 MG PO CAPS
ORAL_CAPSULE | ORAL | Status: AC
  Administered 2021-09-21: 200 mg via ORAL
  Filled 2021-09-21: qty 2

## 2021-09-21 MED ORDER — ACETAMINOPHEN 500 MG PO TABS
ORAL_TABLET | ORAL | Status: AC
  Administered 2021-09-21: 1000 mg via ORAL
  Filled 2021-09-21: qty 2

## 2021-09-21 MED ORDER — LIDOCAINE HCL (PF) 2 % IJ SOLN
INTRAMUSCULAR | Status: AC
  Filled 2021-09-21: qty 15

## 2021-09-21 MED ORDER — PROPOFOL 1000 MG/100ML IV EMUL
INTRAVENOUS | Status: AC
  Filled 2021-09-21: qty 100

## 2021-09-21 MED ORDER — LIDOCAINE-EPINEPHRINE 1 %-1:100000 IJ SOLN
INTRAMUSCULAR | Status: DC | PRN
  Administered 2021-09-21: 10 mL

## 2021-09-21 MED ORDER — METHOCARBAMOL 1000 MG/10ML IJ SOLN
500.0000 mg | Freq: Four times a day (QID) | INTRAVENOUS | Status: DC | PRN
  Filled 2021-09-21 (×2): qty 5

## 2021-09-21 MED ORDER — METHOCARBAMOL 500 MG PO TABS: 500.0000 mg | ORAL_TABLET | Freq: Four times a day (QID) | ORAL | Status: DC | PRN

## 2021-09-21 MED ORDER — LOSARTAN POTASSIUM 50 MG PO TABS
50.0000 mg | ORAL_TABLET | Freq: Every day | ORAL | Status: DC
  Administered 2021-09-22: 50 mg via ORAL
  Filled 2021-09-21: qty 1

## 2021-09-21 MED ORDER — ZOLMITRIPTAN 5 MG PO TBDP: 5.0000 mg | ORAL_TABLET | ORAL | Status: DC | PRN

## 2021-09-21 MED ORDER — GLYCOPYRROLATE 0.2 MG/ML IJ SOLN
INTRAMUSCULAR | Status: AC
  Filled 2021-09-21: qty 3

## 2021-09-21 MED ORDER — SUCCINYLCHOLINE CHLORIDE 200 MG/10ML IV SOSY
PREFILLED_SYRINGE | INTRAVENOUS | Status: DC | PRN
Start: 2021-09-21 — End: 2021-09-21
  Administered 2021-09-21: 80 mg via INTRAVENOUS

## 2021-09-21 MED ORDER — HYDROMORPHONE HCL 1 MG/ML IJ SOLN
INTRAMUSCULAR | Status: AC
  Administered 2021-09-21: 0.5 mg via INTRAVENOUS
  Filled 2021-09-21: qty 1

## 2021-09-21 MED ORDER — ATORVASTATIN CALCIUM 20 MG PO TABS
40.0000 mg | ORAL_TABLET | Freq: Every day | ORAL | Status: DC
  Administered 2021-09-21 – 2021-09-22 (×2): 40 mg via ORAL
  Filled 2021-09-21 (×2): qty 2

## 2021-09-21 MED ORDER — LACTATED RINGERS IV SOLN: INTRAVENOUS | Status: DC

## 2021-09-21 MED ORDER — FENTANYL CITRATE (PF) 100 MCG/2ML IJ SOLN
25.0000 ug | INTRAMUSCULAR | Status: AC | PRN
  Administered 2021-09-21 (×2): 25 ug via INTRAVENOUS

## 2021-09-21 MED ORDER — PHENYLEPHRINE HCL (PRESSORS) 10 MG/ML IV SOLN
INTRAVENOUS | Status: DC | PRN
  Administered 2021-09-21: 200 ug via INTRAVENOUS

## 2021-09-21 MED ORDER — ACETAMINOPHEN 500 MG PO TABS
1000.0000 mg | ORAL_TABLET | Freq: Four times a day (QID) | ORAL | Status: DC
  Administered 2021-09-22: 1000 mg via ORAL

## 2021-09-21 MED ORDER — LIDOCAINE HCL (CARDIAC) PF 100 MG/5ML IV SOSY
PREFILLED_SYRINGE | INTRAVENOUS | Status: DC | PRN
  Administered 2021-09-21: 70 mg via INTRAVENOUS

## 2021-09-21 MED ORDER — ACETAMINOPHEN 10 MG/ML IV SOLN
INTRAVENOUS | Status: AC
  Filled 2021-09-21: qty 100

## 2021-09-21 MED ORDER — SODIUM CHLORIDE 0.9% FLUSH: 3.0000 mL | Freq: Two times a day (BID) | INTRAVENOUS | Status: DC

## 2021-09-21 MED ORDER — EPHEDRINE 5 MG/ML INJ
INTRAVENOUS | Status: AC
  Filled 2021-09-21: qty 5

## 2021-09-21 MED ORDER — FENTANYL CITRATE (PF) 100 MCG/2ML IJ SOLN
25.0000 ug | INTRAMUSCULAR | Status: DC | PRN
  Administered 2021-09-21: 50 ug via INTRAVENOUS

## 2021-09-21 MED ORDER — GLYCOPYRROLATE 0.2 MG/ML IJ SOLN
INTRAMUSCULAR | Status: DC | PRN
  Administered 2021-09-21: .2 mg via INTRAVENOUS

## 2021-09-21 MED ORDER — SODIUM CHLORIDE 0.9% FLUSH: 3.0000 mL | INTRAVENOUS | Status: DC | PRN

## 2021-09-21 MED ORDER — OXYCODONE HCL 5 MG PO TABS
5.0000 mg | ORAL_TABLET | ORAL | Status: DC | PRN
  Administered 2021-09-21: 5 mg via ORAL

## 2021-09-21 MED ORDER — FENTANYL CITRATE (PF) 100 MCG/2ML IJ SOLN
INTRAMUSCULAR | Status: AC
  Administered 2021-09-21: 50 ug via INTRAVENOUS
  Filled 2021-09-21: qty 2

## 2021-09-21 MED ORDER — PROPOFOL 10 MG/ML IV BOLUS
INTRAVENOUS | Status: DC | PRN
  Administered 2021-09-21: 140 mg via INTRAVENOUS

## 2021-09-21 MED ORDER — LIDOCAINE-EPINEPHRINE 1 %-1:100000 IJ SOLN
INTRAMUSCULAR | Status: AC
  Filled 2021-09-21: qty 1

## 2021-09-21 MED ORDER — ONDANSETRON HCL 4 MG/2ML IJ SOLN
INTRAMUSCULAR | Status: AC
  Filled 2021-09-21: qty 4

## 2021-09-21 MED ORDER — POLYETHYLENE GLYCOL 3350 17 G PO PACK
17.0000 g | PACK | Freq: Every day | ORAL | Status: DC | PRN
  Filled 2021-09-21: qty 1

## 2021-09-21 MED ORDER — ONDANSETRON HCL 4 MG/2ML IJ SOLN: 4.0000 mg | Freq: Four times a day (QID) | INTRAMUSCULAR | Status: DC | PRN

## 2021-09-21 MED ORDER — MORPHINE SULFATE (PF) 4 MG/ML IV SOLN: 2.0000 mg | INTRAVENOUS | Status: DC | PRN

## 2021-09-21 MED ORDER — KETOROLAC TROMETHAMINE 15 MG/ML IJ SOLN: 15.0000 mg | Freq: Four times a day (QID) | INTRAMUSCULAR | Status: AC

## 2021-09-21 MED ORDER — SUMATRIPTAN SUCCINATE 50 MG PO TABS
100.0000 mg | ORAL_TABLET | ORAL | Status: DC | PRN
  Filled 2021-09-21: qty 2

## 2021-09-21 MED ORDER — HYDROMORPHONE HCL 1 MG/ML IJ SOLN
0.5000 mg | INTRAMUSCULAR | Status: DC | PRN
  Administered 2021-09-21: 0.5 mg via INTRAVENOUS

## 2021-09-21 MED ORDER — ORAL CARE MOUTH RINSE: 15.0000 mL | Freq: Once | OROMUCOSAL | Status: AC

## 2021-09-21 MED ORDER — ONDANSETRON HCL 4 MG PO TABS: 4.0000 mg | ORAL_TABLET | Freq: Four times a day (QID) | ORAL | Status: DC | PRN

## 2021-09-21 MED ORDER — CHLORHEXIDINE GLUCONATE 0.12 % MT SOLN: 15.0000 mL | Freq: Once | OROMUCOSAL | Status: AC

## 2021-09-21 MED ORDER — FENTANYL CITRATE (PF) 100 MCG/2ML IJ SOLN
INTRAMUSCULAR | Status: DC | PRN
  Administered 2021-09-21 (×2): 50 ug via INTRAVENOUS

## 2021-09-21 MED ORDER — PHENYLEPHRINE HCL (PRESSORS) 10 MG/ML IV SOLN
INTRAVENOUS | Status: AC
  Filled 2021-09-21: qty 1

## 2021-09-21 MED ORDER — REMIFENTANIL HCL 1 MG IV SOLR
INTRAVENOUS | Status: AC
  Filled 2021-09-21: qty 1000

## 2021-09-21 MED ORDER — REMIFENTANIL HCL 1 MG IV SOLR
INTRAVENOUS | Status: DC | PRN
  Administered 2021-09-21: .1 ug/kg/min via INTRAVENOUS

## 2021-09-21 MED ORDER — ACETAMINOPHEN 10 MG/ML IV SOLN: 1000.0000 mg | Freq: Once | INTRAVENOUS | Status: DC | PRN

## 2021-09-21 MED ORDER — OXYCODONE HCL 5 MG/5ML PO SOLN: 5.0000 mg | Freq: Once | ORAL | Status: DC | PRN

## 2021-09-21 MED ORDER — SENNA 8.6 MG PO TABS
1.0000 | ORAL_TABLET | Freq: Two times a day (BID) | ORAL | Status: DC
  Administered 2021-09-22: 8.6 mg via ORAL
  Filled 2021-09-21 (×4): qty 1

## 2021-09-21 MED ORDER — FAMOTIDINE 20 MG PO TABS: 20.0000 mg | ORAL_TABLET | Freq: Once | ORAL | Status: AC

## 2021-09-21 MED ORDER — MENTHOL 3 MG MT LOZG
1.0000 | LOZENGE | OROMUCOSAL | Status: DC | PRN
  Filled 2021-09-21: qty 9

## 2021-09-21 MED ORDER — DEXAMETHASONE SODIUM PHOSPHATE 10 MG/ML IJ SOLN
INTRAMUSCULAR | Status: DC | PRN
  Administered 2021-09-21: 10 mg via INTRAVENOUS

## 2021-09-21 MED ORDER — CEFAZOLIN SODIUM-DEXTROSE 2-4 GM/100ML-% IV SOLN
INTRAVENOUS | Status: AC
  Filled 2021-09-21: qty 100

## 2021-09-21 MED ORDER — GABAPENTIN 100 MG PO CAPS: 200.0000 mg | ORAL_CAPSULE | Freq: Three times a day (TID) | ORAL | Status: DC

## 2021-09-21 MED ORDER — DEXAMETHASONE SODIUM PHOSPHATE 10 MG/ML IJ SOLN
INTRAMUSCULAR | Status: AC
  Filled 2021-09-21: qty 2

## 2021-09-21 MED ORDER — SURGIFLO WITH THROMBIN (HEMOSTATIC MATRIX KIT) OPTIME
TOPICAL | Status: DC | PRN
  Administered 2021-09-21: 1 via TOPICAL

## 2021-09-21 MED ORDER — OXYCODONE HCL 5 MG PO TABS: 5.0000 mg | ORAL_TABLET | Freq: Once | ORAL | Status: DC | PRN

## 2021-09-21 MED ORDER — HEPARIN SODIUM (PORCINE) 1000 UNIT/ML IJ SOLN
INTRAMUSCULAR | Status: AC
  Filled 2021-09-21: qty 1

## 2021-09-21 MED ORDER — PROPOFOL 500 MG/50ML IV EMUL
INTRAVENOUS | Status: DC | PRN
  Administered 2021-09-21: 125 ug/kg/min via INTRAVENOUS

## 2021-09-21 MED ORDER — ACETAMINOPHEN 10 MG/ML IV SOLN
INTRAVENOUS | Status: DC | PRN
  Administered 2021-09-21: 1000 mg via INTRAVENOUS

## 2021-09-21 SURGICAL SUPPLY — 70 items
ADH SKN CLS APL DERMABOND .7 (GAUZE/BANDAGES/DRESSINGS) ×1
AGENT HMST KT MTR STRL THRMB (HEMOSTASIS) ×1
APL PRP STRL LF DISP 70% ISPRP (MISCELLANEOUS) ×2
BLADE CLIPPER SPEC (BLADE) ×1 IMPLANT
BUR NEURO DRILL SOFT 3.0X3.8M (BURR) ×2 IMPLANT
BUR SABER DIAMOND 3.0 (BURR) IMPLANT
CHLORAPREP W/TINT 26 (MISCELLANEOUS) ×4 IMPLANT
COLLAR CERV HARD 4.25 SM TX991 (SOFTGOODS) IMPLANT
COLLAR CERV SM MED DENS 3 (SOFTGOODS) ×1 IMPLANT
COUNTER NEEDLE 20/40 LG (NEEDLE) ×2 IMPLANT
COVER LIGHT HANDLE STERIS (MISCELLANEOUS) ×4 IMPLANT
CUP MEDICINE 2OZ PLAST GRAD ST (MISCELLANEOUS) ×4 IMPLANT
DERMABOND ADVANCED (GAUZE/BANDAGES/DRESSINGS) ×1
DERMABOND ADVANCED .7 DNX12 (GAUZE/BANDAGES/DRESSINGS) ×1 IMPLANT
DRAPE C-ARM 42X72 X-RAY (DRAPES) ×4 IMPLANT
DRAPE C-ARMOR (DRAPES) ×1 IMPLANT
DRAPE INCISE IOBAN 66X45 STRL (DRAPES) ×2 IMPLANT
DRAPE MICROSCOPE SPINE 48X150 (DRAPES) IMPLANT
DRAPE THYROID T SHEET (DRAPES) ×2 IMPLANT
DRSG OPSITE POSTOP 4X6 (GAUZE/BANDAGES/DRESSINGS) ×1 IMPLANT
DRSG OPSITE POSTOP 4X8 (GAUZE/BANDAGES/DRESSINGS) IMPLANT
DRSG TEGADERM 2-3/8X2-3/4 SM (GAUZE/BANDAGES/DRESSINGS) ×1 IMPLANT
ELECT CAUTERY BLADE TIP 2.5 (TIP) ×2
ELECTRODE CAUTERY BLDE TIP 2.5 (TIP) ×1 IMPLANT
FEE INTRAOP CADWELL SUPPLY NCS (MISCELLANEOUS) ×1 IMPLANT
FEE INTRAOP MONITOR IMPULS NCS (MISCELLANEOUS) IMPLANT
GAUZE 4X4 16PLY ~~LOC~~+RFID DBL (SPONGE) ×2 IMPLANT
GAUZE SPONGE 4X4 12PLY STRL (GAUZE/BANDAGES/DRESSINGS) ×2 IMPLANT
GAUZE XEROFORM 1X8 LF (GAUZE/BANDAGES/DRESSINGS) ×1 IMPLANT
GLOVE SRG 8 PF TXTR STRL LF DI (GLOVE) ×1 IMPLANT
GLOVE SURG SYN 6.5 ES PF (GLOVE) ×4 IMPLANT
GLOVE SURG SYN 6.5 PF PI (GLOVE) ×2 IMPLANT
GLOVE SURG SYN 8.0 (GLOVE) ×4 IMPLANT
GLOVE SURG SYN 8.0 PF PI (GLOVE) ×2 IMPLANT
GLOVE SURG UNDER POLY LF SZ6.5 (GLOVE) ×4 IMPLANT
GLOVE SURG UNDER POLY LF SZ8 (GLOVE) ×2
GOWN SRG LRG LVL 4 IMPRV REINF (GOWNS) ×2 IMPLANT
GOWN STRL REIN LRG LVL4 (GOWNS) ×4
GOWN STRL REUS W/ TWL XL LVL3 (GOWN DISPOSABLE) ×2 IMPLANT
GOWN STRL REUS W/TWL XL LVL3 (GOWN DISPOSABLE) ×4
GRADUATE 1200CC STRL 31836 (MISCELLANEOUS) ×2 IMPLANT
HEMOVAC 400CC 10FR (MISCELLANEOUS) ×1 IMPLANT
INTRAOP CADWELL SUPPLY FEE NCS (MISCELLANEOUS) ×1
INTRAOP DISP SUPPLY FEE NCS (MISCELLANEOUS) ×2
INTRAOP MONITOR FEE IMPULS NCS (MISCELLANEOUS) ×1
INTRAOP MONITOR FEE IMPULSE (MISCELLANEOUS) ×2
KIT TURNOVER KIT A (KITS) ×2 IMPLANT
MANIFOLD NEPTUNE II (INSTRUMENTS) ×2 IMPLANT
MARKER SKIN DUAL TIP RULER LAB (MISCELLANEOUS) ×4 IMPLANT
NEEDLE HYPO 22GX1.5 SAFETY (NEEDLE) ×2 IMPLANT
PACK LAMINECTOMY NEURO (CUSTOM PROCEDURE TRAY) ×2 IMPLANT
PAD ARMBOARD 7.5X6 YLW CONV (MISCELLANEOUS) ×6 IMPLANT
PIN MAYFIELD SKULL DISP (PIN) ×2 IMPLANT
SPONGE GAUZE 2X2 8PLY STRL LF (GAUZE/BANDAGES/DRESSINGS) ×1 IMPLANT
STAPLER SKIN PROX 35W (STAPLE) ×2 IMPLANT
SURGIFLO W/THROMBIN 8M KIT (HEMOSTASIS) ×2 IMPLANT
SUT ETHILON 3 0 PS 1 (SUTURE) ×1 IMPLANT
SUT ETHILON 3-0 FS-10 30 BLK (SUTURE) ×2
SUT NURALON 4 0 TR CR/8 (SUTURE) ×1 IMPLANT
SUT POLYSORB 2-0 5X18 GS-10 (SUTURE) ×5 IMPLANT
SUT PROLENE 5 0 RB 2 (SUTURE) IMPLANT
SUT VIC AB 0 CT1 18XCR BRD 8 (SUTURE) ×1 IMPLANT
SUT VIC AB 0 CT1 8-18 (SUTURE) ×4
SUTURE EHLN 3-0 FS-10 30 BLK (SUTURE) IMPLANT
SYR 30ML LL (SYRINGE) ×2 IMPLANT
TAPE CLOTH 3X10 WHT NS LF (GAUZE/BANDAGES/DRESSINGS) ×3 IMPLANT
TOWEL OR 17X26 4PK STRL BLUE (TOWEL DISPOSABLE) ×4 IMPLANT
TRAY FOLEY MTR SLVR 16FR STAT (SET/KITS/TRAYS/PACK) ×1 IMPLANT
TUBING CONNECTING 10 (TUBING) ×2 IMPLANT
WATER STERILE IRR 500ML POUR (IV SOLUTION) ×1 IMPLANT

## 2021-09-21 NOTE — Discharge Instructions (Addendum)
NEUROSURGERY DISCHARGE INSTRUCTIONS  Admission diagnosis: Cervical myelopathy (HCC) [G95.9]  Operative procedure: C4-6 laminectomies  What to do after you leave the hospital:  Recommended diet: regular diet. Increase protein intake to promote wound healing.  Recommended activity: no lifting, driving, or strenuous exercise for 6 weeks . You should walk multiple times per day  Special Instructions  No straining, no heavy lifting > 10lbs x 4 weeks.  Keep incision area clean and dry. May shower in tomorrow No baths or pools for 6 weeks.  Please remove dressing tomorrow, no need to apply a bandage afterwards  You have sutures or staples that will be removed in clinic.   Please take pain medications as directed. Take a stool softener if on pain medications   Please Report any of the following: Nausea or Vomiting, Temperature is greater than 101.40F (38.1C) degrees, Dizziness, Abdominal Pain, Difficulty Breathing or Shortness of Breath, Inability to Eat, drink Fluids, or Take medications, Bleeding, swelling, or drainage from surgical incision sites, New numbness or weakness, and Bowel or bladder dysfunction to the neurosurgeon on call at [phone]  Additional Follow up appointments Please follow up with Manning Charity PA-C in [place] clinic as scheduled in 2-3 weeks   Please see below for scheduled appointments:  Future Appointments  Date Time Provider Department Center  07/21/2023  8:30 AM [facility] 2 [facility] None  07/21/2023  9:30 AM Schnier, Latina Craver, MD [facility] None

## 2021-09-21 NOTE — Consult Note (Signed)
Pharmacy Antibiotic Note  Carrie [name] is a 75 y.o. female admitted on 09/21/21 with surgical prophylaxis.  Pharmacy has been consulted for Cefazolin  dosing.  Plan: Cefazolin 2 gram x 1 30-60 mins prior to procedure  Repeat dose intraoperatively in 4 hours if procedure is lengthy or if there is excessive blood loss      No data recorded.  No results for input(s): WBC, CREATININE, LATICACIDVEN, VANCOTROUGH, VANCOPEAK, VANCORANDOM, GENTTROUGH, GENTPEAK, GENTRANDOM, TOBRATROUGH, TOBRAPEAK, TOBRARND, AMIKACINPEAK, AMIKACINTROU, AMIKACIN in the last 168 hours.  Estimated Creatinine Clearance: 52.3 mL/min (by C-G formula based on SCr of 0.8 mg/dL).    Allergies  Allergen Reactions   Pneumococcal Polysaccharide Vaccine Rash    Severe local reaction      Thank you for allowing pharmacy to be a part of this patient's care.  Sharen Hones, PharmD, BCPS Clinical Pharmacist   09/21/2021 7:30 AM

## 2021-09-21 NOTE — Discharge Summary (Signed)
Physician Discharge Summary  Patient ID: Carrie [name] MRN: [id number] DOB/AGE: 1946/01/10 75 y.o.  Admit date: 09/21/2021 Discharge date: 09/22/2021  Admission Diagnoses: Cervical stenosis with myelopathy  Discharge Diagnoses:  Principal Problem:   Cervical myelopathy [facility])   Discharged Condition: good  Hospital Course:  Carrie [name] is a 75 y.o presenting with upper extremity weakness and gait instability with cervical stenosis.  She underwent a open C4-6 cervical decompression on 09/21/2021.  Her intraoperative course was uncomplicated and a drain was placed intraoperatively.  She was admitted for further monitoring and pain control.  Her drain output was monitored and removed when the output reduced to an acceptable level.  The patient was discharged home on POD 1 with medications for pain and muscle relaxant.  Consults: None  Significant Diagnostic Studies: none  Treatments: surgery: As above.  Please see separately dictated operative report for further details.  Discharge Exam: Blood pressure 120/70, pulse 70, temperature 97.7 F (36.5 C), temperature source Temporal, resp. rate 16, height 5' (1.524 m), weight 68.1 kg, SpO2 97 %. CN II-XII grossly intact Incision c/d/I with staples in place 5/5 throughout except 4+ HG and IO bilaterally  Disposition: Discharge disposition: 01-Home or Self Care      Discharge Instructions     Incentive spirometry RT   Complete by: As directed    No dressing needed   Complete by: As directed       Allergies as of 09/22/2021       Reactions   Pneumococcal Polysaccharide Vaccine Rash   Severe local reaction         Medication List     STOP taking these medications    Aspirin Low Dose 81 MG EC tablet Generic drug: aspirin   aspirin-acetaminophen-caffeine 250-250-65 MG tablet Commonly known as: EXCEDRIN MIGRAINE       TAKE these medications    amoxicillin 500 MG tablet Commonly known as: AMOXIL Take 2,000 mg by  mouth See admin instructions. Dental procedures   atorvastatin 40 MG tablet Commonly known as: LIPITOR Take 40 mg by mouth at bedtime.   donepezil 5 MG tablet Commonly known as: ARICEPT Take 5 mg by mouth at bedtime.   gabapentin 100 MG capsule Commonly known as: NEURONTIN Take 200 mg by mouth 3 (three) times daily.   losartan 50 MG tablet Commonly known as: COZAAR Take 50 mg by mouth every morning.   magnesium oxide 400 MG tablet Commonly known as: MAG-OX Take 400 mg by mouth daily.   Melatonin 5 MG Caps Take 5 mg by mouth at bedtime as needed (sleep).   methocarbamol 500 MG tablet Commonly known as: ROBAXIN Take 1 tablet (500 mg total) by mouth every 6 (six) hours as needed for muscle spasms.   multivitamin with minerals tablet Take 1 tablet by mouth daily. 50 +   oxyCODONE 5 MG immediate release tablet Commonly known as: Oxy IR/ROXICODONE Take 1 tablet (5 mg total) by mouth every 4 (four) hours as needed for up to 7 days for moderate pain ((score 4 to 6)).   senna 8.6 MG Tabs tablet Commonly known as: SENOKOT Take 1 tablet (8.6 mg total) by mouth 2 (two) times daily.   VITAMIN B-12 PO Take 2,500 mcg by mouth daily.   vitamin C 1000 MG tablet Take 1,000 mg by mouth daily.   zinc gluconate 50 MG tablet Take 50 mg by mouth daily.   zolmitriptan 5 MG disintegrating tablet Commonly known as: ZOMIG-ZMT Take 5 mg by mouth  as needed for migraine.   ZzzQuil 50 MG/30ML Liqd Generic drug: diphenhydrAMINE HCl Take 30 mLs by mouth at bedtime as needed (sleep).               Discharge Care Instructions  (From admission, onward)           Start     Ordered   09/22/21 0000  No dressing needed        09/22/21 7867            Follow-up Information     Susanne Borders, PA Follow up in 2 week(s).   Why: For staple removal and wound check.  This appointment should already be scheduled with the [facility] clinic.  Please call the office with any  questions or concerns regarding appointment date or time. Contact information: [address] [place] [address] [phone]                 Signed: Susanne Borders 09/22/2021, 8:51 AM

## 2021-09-21 NOTE — Anesthesia Procedure Notes (Addendum)
Procedure Name: Intubation Date/Time: 09/21/2021 9:19 AM Performed by: Joanette Gula, Dinnis Rog, CRNA Pre-anesthesia Checklist: Patient identified, Emergency Drugs available, Suction available and Patient being monitored Patient Re-evaluated:Patient Re-evaluated prior to induction Oxygen Delivery Method: Circle system utilized Preoxygenation: Pre-oxygenation with 100% oxygen Induction Type: IV induction Ventilation: Mask ventilation without difficulty Laryngoscope Size: McGraph and 3 Grade View: Grade I Tube type: Oral Tube size: 7.0 mm Number of attempts: 1 Airway Equipment and Method: Stylet and Oral airway Placement Confirmation: ETT inserted through vocal cords under direct vision, positive ETCO2 and breath sounds checked- equal and bilateral Secured at: 21 cm Tube secured with: Tape Dental Injury: Teeth and Oropharynx as per pre-operative assessment

## 2021-09-21 NOTE — Interval H&P Note (Signed)
History and Physical Interval Note:  09/21/2021 8:37 AM  Carrie [name]  has presented today for surgery, with the diagnosis of cervical myelopathy g95.9.  The various methods of treatment have been discussed with the patient and family. After consideration of risks, benefits and other options for treatment, the patient has consented to  Procedure(s): C4-6 LAMINECTOMY (N/A) as a surgical intervention.  The patient's history has been reviewed, patient examined, no change in status, stable for surgery.  I have reviewed the patient's chart and labs.  Questions were answered to the patient's satisfaction.     Lucy Chris

## 2021-09-21 NOTE — Op Note (Signed)
   Operative Note   SURGERY DATE:  09/21/2021   PRE-OP DIAGNOSIS:  Cervical Myelopathy   POST-OP DIAGNOSIS: Post-Op Diagnosis Codes:  Cervical Myelopathy   Procedure(s) with comments: C4-6 Laminectomy   SURGEON:     * Nathaniel Man, MD       Manning Charity, PA Assistant   ANESTHESIA: General    OPERATIVE FINDINGS: Compression at C4-6  PROCEDURE  Indications Ms Folden was seen in clinic on 9/27 with history of cervical stenosis and myelopathy symptoms due to compression at C4-6. Given this, we recommended a posterior cervical decompression to relieve the pressure on his spinal cord. The risks of hematoma, infection, cord injury, weakness, numbness, neck pain, stroke, and death were discussed in detail. All questions were answered and the patient elected to proceed with the surgery.    Procedure After obtaining informed consent, the patient was taken to the Operating Room where general anesthesia was induced and the patient intubated. Vascular access was obtained. Neuromonitoring electrodes had been placed and adequate baselines for MEP and SSEP obtained.The Mayfield pins were applied, and the patient was positioned prone with neck in a neutral position with slight flexion on a standard OR table with appropriate padding of pressure points.  Of note, monitoring remained stable after flip except mild decrease in left hand.     The patient was prepped and draped in the usual sterile fashion and a timeout was performed per protocol. Local anesthesia was instilled with epinephrine along the planned incision site. A midline incision was performed from C3 to C7. The incision was carried to the level of the fascia and then to the lamina from C3 to C7 taking care not to disrupt the musculature and ligament adjacent to these levels. Fluoroscopy conformed the levels.    Next, the decompression was performed. A high speed drill with 80mm bit was used to drill the lamina laterally on each side from  C4 to C6 to expose the ligament. Once the central portion of bone was freed, the lamina were removed using dissection technique as one piece exposing the dura beneath. There were areas of compression laterally and these were removed with Kerrison rongeur until the dura was without compression. We also removed the rostral portion of C7 and remaining ligament to decompress the spinal cord.     A medium hemovac was tunneled inferiorly through the skin. The muscle and fascia were closed with 0 vicryl suture. The subcutaneous and dermis was closed with 2-0 vicryl suture. The skin was closed with staples.   The patient was returned to supine position and head fixation removed. The patient had general anesthesia reversed and was extubated following the procedure. She was taken to the PACU where she continued recovery.. Following the procedure, I spoke with the patient's family about the procedure and answered all questions.   ESTIMATED BLOOD LOSS:   100 cc   SPECIMENS None   IMPLANT None     I performed the case in its entirety with assistance of PA, Flora Lipps, MD [phone]

## 2021-09-21 NOTE — Transfer of Care (Signed)
Immediate Anesthesia Transfer of Care Note  Patient: Carrie [name]  Procedure(s) Performed: C4-6 LAMINECTOMY (Neck)  Patient Location: PACU  Anesthesia Type:General  Level of Consciousness: awake, alert  and oriented  Airway & Oxygen Therapy: Patient Spontanous Breathing and Patient connected to face mask oxygen  Post-op Assessment: Report given to RN and Post -op Vital signs reviewed and stable  Post vital signs: Reviewed and stable  Last Vitals:  Vitals Value Taken Time  BP 161/75 09/21/21 1134  Temp    Pulse 107 09/21/21 1136  Resp 21 09/21/21 1136  SpO2 100 % 09/21/21 1136  Vitals shown include unvalidated device data.  Last Pain:  Vitals:   09/21/21 0817  TempSrc: Oral  PainSc: 0-No pain         Complications: No notable events documented.

## 2021-09-21 NOTE — Anesthesia Postprocedure Evaluation (Signed)
Anesthesia Post Note  Patient: Carrie [name]  Procedure(s) Performed: C4-6 LAMINECTOMY (Neck)  Patient location during evaluation: PACU Anesthesia Type: General Level of consciousness: awake and alert, oriented and patient cooperative Pain management: pain level controlled Vital Signs Assessment: post-procedure vital signs reviewed and stable Respiratory status: spontaneous breathing, nonlabored ventilation and respiratory function stable Cardiovascular status: blood pressure returned to baseline and stable Postop Assessment: adequate PO intake Anesthetic complications: no   No notable events documented.   Last Vitals:  Vitals:   09/21/21 1300 09/21/21 1315  BP: (!) 145/67 (!) 131/49  Pulse: (!) 55 (!) 50  Resp: 11 (!) 6  Temp:    SpO2: 100% 100%    Last Pain:  Vitals:   09/21/21 1235  TempSrc:   PainSc: 6                  Reed Breech

## 2021-09-21 NOTE — Anesthesia Preprocedure Evaluation (Addendum)
Anesthesia Evaluation  Patient identified by MRN, date of birth, ID band Patient awake    Reviewed: Allergy & Precautions, NPO status , Patient's Chart, lab work & pertinent test results  History of Anesthesia Complications (+) PONV and history of anesthetic complications  Airway Mallampati: IV   Neck ROM: Full    Dental no notable dental hx.    Pulmonary neg pulmonary ROS,    Pulmonary exam normal breath sounds clear to auscultation       Cardiovascular hypertension, Normal cardiovascular exam Rhythm:Regular Rate:Normal  ECG 09/10/21:  Sinus bradycardia (HR 53) Otherwise normal ECG   Neuro/Psych  Headaches, PSYCHIATRIC DISORDERS Anxiety    GI/Hepatic GERD  ,  Endo/Other  negative endocrine ROS  Renal/GU negative Renal ROS     Musculoskeletal  (+) Arthritis ,   Abdominal   Peds  Hematology negative hematology ROS (+)   Anesthesia Other Findings   Reproductive/Obstetrics                            Anesthesia Physical Anesthesia Plan  ASA: 2  Anesthesia Plan: General   Post-op Pain Management:    Induction: Intravenous  PONV Risk Score and Plan: 4 or greater and Ondansetron, Dexamethasone and Treatment may vary due to age or medical condition  Airway Management Planned: Oral ETT  Additional Equipment:   Intra-op Plan:   Post-operative Plan: Extubation in OR  Informed Consent: I have reviewed the patients History and Physical, chart, labs and discussed the procedure including the risks, benefits and alternatives for the proposed anesthesia with the patient or authorized representative who has indicated his/her understanding and acceptance.     Dental advisory given  Plan Discussed with: CRNA  Anesthesia Plan Comments: (Patient consented for risks of anesthesia including but not limited to:  - adverse reactions to medications - damage to eyes, teeth, lips or other oral  mucosa - nerve damage due to positioning  - sore throat or hoarseness - damage to heart, brain, nerves, lungs, other parts of body or loss of life  Informed patient about role of CRNA in peri- and intra-operative care.  Patient voiced understanding.)        Anesthesia Quick Evaluation

## 2021-09-21 NOTE — Progress Notes (Signed)
   09/21/21 1400  Assess: MEWS Score  Temp (!) 96.7 F (35.9 C)  BP 96/75  Pulse Rate (!) 54  Resp 14  Level of Consciousness Alert  SpO2 96 %  O2 Device Room Air  Assess: MEWS Score  MEWS Temp 1  MEWS Systolic 1  MEWS Pulse 0  MEWS RR 0  MEWS LOC 0  MEWS Score 2  MEWS Score Color Yellow  Assess: if the MEWS score is Yellow or Red  Were vital signs taken at a resting state? Yes  Focused Assessment No change from prior assessment  Early Detection of Sepsis Score *See Row Information* Low  MEWS guidelines implemented *See Row Information* Yes  Treat  Pain Scale 0-10  Pain Score 0  Take Vital Signs  Increase Vital Sign Frequency  Yellow: Q 2hr X 2 then Q 4hr X 2, if remains yellow, continue Q 4hrs  Escalate  MEWS: Escalate Yellow: discuss with charge nurse/RN and consider discussing with provider and RRT  Notify: Charge Nurse/RN  Name of Charge Nurse/RN Notified Cindy, RN  Date Charge Nurse/RN Notified 09/21/21  Time Charge Nurse/RN Notified 1415  Document  Patient Outcome Other (Comment) (continue to monitor)  Progress note created (see row info) Yes

## 2021-09-21 NOTE — H&P (Signed)
Carrie [name] is an 75 y.o. female.   Chief Complaint: Numbness and balance difficulty HPI: Carrie [name] is here for evaluation of ongoing symptoms consisting of numbness, balance difficulty, and weakness that she noticed in the past few months but may have been going on longer. She is fairly active and does not report any recent falls. She had 1 months ago but noticed no worsening symptoms after this. She has had intermittent numbness in her hands and also noticed that her right hand is weaker than her left. She additionally has some pain and numbness in her legs but also noted some balance difficulty when walking. This prompted an MRI of the cervical spine which did reveal severe stenosis. She has not had any recent physical therapy.  She does have a history of concern for stroke, follows with Dr. Sherryll Burger, and is on aspirin. She has not had any recent symptoms concerning for stroke. She is here for decompression of the cervical spine   Past Medical History:  Diagnosis Date   Anxiety    Arthritis    Cataract cortical, senile    Cellulitis due to MRSA    right leg (hospitalized [facility])   Chronic pain in right foot    Colovaginal fistula    Diverticulosis    Fibrocystic breast    Fibromuscular dysplasia (HCC)    GERD (gastroesophageal reflux disease)    Heart murmur    mild   History of blood transfusion 1998   slight rash with   History of hypertension    off all meds for last 4 years   History of methicillin resistant staphylococcus aureus (MRSA)    History of stomach ulcers    Hypertension    Lichen sclerosus of female genitalia    Migraines    Mild cognitive impairment    PONV (postoperative nausea and vomiting)    just once -takes alot to sedate patient   Thyroid nodule    TIA (transient ischemic attack) 2022   Dr Sherryll Burger    Past Surgical History:  Procedure Laterality Date   ABDOMINAL HYSTERECTOMY  1998   benign breast tumor removed Left 1967   BREAST EXCISIONAL BIOPSY Bilateral  1967 for left, 2010 for right   benign   cataract surgery  8 yrs ago   both eyes   COLON RESECTION  2015   anterior sigmoid   COLON SURGERY     8"colon removed-fistula   COLONOSCOPY WITH PROPOFOL N/A 05/30/2015   Procedure: COLONOSCOPY WITH PROPOFOL;  Surgeon: Wallace Cullens, MD;  [facility] END[facility]COPY;  Service: Gastroenterology;  Laterality: N/A;   EUS N/A 02/22/2013   Procedure: UPPER ENDOSCOPIC ULTRASOUND (EUS) LINEAR;  Surgeon: Rachael Fee, MD;  Location: [facility] ENDOSCOPY;  Service: Endoscopy;  Laterality: N/A;   excision cyst index finger Left    extensor tendon and extensor retinacular release   EYE SURGERY Bilateral    cataract   KNEE ARTHROPLASTY Left 11/26/2019   Procedure: COMPUTER ASSISTED TOTAL KNEE ARTHROPLASTY;  Surgeon: Donato Heinz, MD;  Location: [facility] ORS;  Service: Orthopedics;  Laterality: Left;   right benign breast lump removed  2009   tummy tuck     WRIST GANGLION EXCISION Left     Family History  Problem Relation Age of Onset   Breast cancer Maternal Aunt 32   Social History:  reports that she has never smoked. She has never used smokeless tobacco. She reports that she does not drink alcohol and does not use drugs.  Allergies:  Allergies  Allergen Reactions   Pneumococcal Polysaccharide Vaccine Rash    Severe local reaction      Medications Prior to Admission  Medication Sig Dispense Refill   amoxicillin (AMOXIL) 500 MG tablet Take 2,000 mg by mouth See admin instructions. Dental procedures     Ascorbic Acid (VITAMIN C) 1000 MG tablet Take 1,000 mg by mouth daily.     ASPIRIN LOW DOSE 81 MG EC tablet Take 81 mg by mouth daily.     aspirin-acetaminophen-caffeine (EXCEDRIN MIGRAINE) 250-250-65 MG tablet Take 2 tablets by mouth every 6 (six) hours as needed for headache.     atorvastatin (LIPITOR) 40 MG tablet Take 40 mg by mouth at bedtime.     Cyanocobalamin (VITAMIN B-12 PO) Take 2,500 mcg by mouth daily.     diphenhydrAMINE HCl (ZZZQUIL) 50  MG/30ML LIQD Take 30 mLs by mouth at bedtime as needed (sleep).     donepezil (ARICEPT) 5 MG tablet Take 5 mg by mouth at bedtime.     gabapentin (NEURONTIN) 100 MG capsule Take 200 mg by mouth 3 (three) times daily.     losartan (COZAAR) 50 MG tablet Take 50 mg by mouth every morning.     magnesium oxide (MAG-OX) 400 MG tablet Take 400 mg by mouth daily.     Melatonin 5 MG CAPS Take 5 mg by mouth at bedtime as needed (sleep).     Multiple Vitamins-Minerals (MULTIVITAMIN WITH MINERALS) tablet Take 1 tablet by mouth daily. 50 +     zinc gluconate 50 MG tablet Take 50 mg by mouth daily.     zolmitriptan (ZOMIG-ZMT) 5 MG disintegrating tablet Take 5 mg by mouth as needed for migraine.      No results found for this or any previous visit (from the past 48 hour(s)). No results found.  Review of Systems General ROS: Negative Psychological ROS: Negative Ophthalmic ROS: Negative ENT ROS: Negative Hematological and Lymphatic ROS: Negative  Endocrine ROS: Negative Respiratory ROS: Negative Cardiovascular ROS: Negative Gastrointestinal ROS: Negative Genito-Urinary ROS: Negative Musculoskeletal ROS: Negative Neurological ROS: Positive for numbness, weakness, balance difficulty Dermatological ROS: Negative Blood pressure (!) 151/75, pulse 73, temperature (!) 97.3 F (36.3 C), temperature source Oral, resp. rate 16, height 5' (1.524 m), weight 68.1 kg, SpO2 100 %. Physical Exam  General appearance: Alert, cooperative, in no acute distress Head: Normocephalic, atraumatic Eyes: Normal, EOM intact Oropharynx: Wearing facemask Neck: Supple, range of motion appears full Ext: No edema in LE bilaterally, warm extremities  Neurologic exam:  Mental status: alertness: alert, affect: normal Speech: fluent and clear Motor:strength symmetric 5/5 in bilateral upper and lower extremities with exception of interossei was 4+ out of 5 bilaterally and right tricep which is 4+ out of 5 Sensory: Decreased  sensation in distal lower extremities, otherwise intact Reflexes: 3+ at bilateral bicep and patella, positive Hoffmann's bilaterally Gait: Unsteady gait  Imaging: MRI cervical spine: There is mid cervical severe degenerative disease. There is disc osteophyte complexes at multiple levels including C4-5, C5-6, and C6-7 where there is severe central stenosis and compression of the spinal cord. There is some T2 hyperdensity within the spinal cord at the lowest level. There is a normal lordotic curvature. Alignment appears maintained. Assessment/Plan Proceed with C4-6 Laminectomy  Lucy Chris, MD 09/21/2021, 8:34 AM

## 2021-09-22 ENCOUNTER — Encounter: Payer: Self-pay | Admitting: Neurosurgery

## 2021-09-22 DIAGNOSIS — M4802 Spinal stenosis, cervical region: Secondary | ICD-10-CM | POA: Diagnosis not present

## 2021-09-22 MED ORDER — OXYCODONE HCL 5 MG PO TABS
ORAL_TABLET | ORAL | Status: AC
  Administered 2021-09-22: 5 mg via ORAL
  Filled 2021-09-22: qty 1

## 2021-09-22 MED ORDER — ACETAMINOPHEN 500 MG PO TABS
ORAL_TABLET | ORAL | Status: AC
  Filled 2021-09-22: qty 2

## 2021-09-22 MED ORDER — METHOCARBAMOL 500 MG PO TABS
500.0000 mg | ORAL_TABLET | Freq: Four times a day (QID) | ORAL | 0 refills | Status: DC | PRN
Start: 2021-09-22 — End: 2022-11-15

## 2021-09-22 MED ORDER — OXYCODONE HCL 5 MG PO TABS: 5.0000 mg | ORAL_TABLET | ORAL | 0 refills | Status: AC | PRN

## 2021-09-22 MED ORDER — SENNA 8.6 MG PO TABS: 1.0000 | ORAL_TABLET | Freq: Two times a day (BID) | ORAL | 0 refills | Status: DC

## 2021-09-22 MED ORDER — KETOROLAC TROMETHAMINE 15 MG/ML IJ SOLN
INTRAMUSCULAR | Status: AC
  Administered 2021-09-22: 15 mg via INTRAVENOUS
  Filled 2021-09-22: qty 1

## 2021-09-22 MED ORDER — GABAPENTIN 100 MG PO CAPS
ORAL_CAPSULE | ORAL | Status: AC
  Administered 2021-09-22: 200 mg via ORAL
  Filled 2021-09-22: qty 2

## 2021-09-22 NOTE — Progress Notes (Signed)
Patient discharged with daughter and written instructions via wheelchair. Has soft collar for home use.

## 2021-09-22 NOTE — TOC Initial Note (Signed)
Transition of Care [facility]) - Initial/Assessment Note    Patient Details  Name: Carrie [name] MRN: [id number] Date of Birth: 1946/09/09  [facility] CM/SW Contact:    Starbuck Cellar, RN Phone Number: 09/22/2021, 12:15 PM  Clinical Narrative:                 Patient discharged prior to [facility] seeing. Per RN patient had no needs from TOC. No HHC or DME needs and discharged home with family and cervical collar in place.         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   Migraine 11/25/2019   Thyroid disease 11/25/2019   B12 deficiency 05/03/2018   Essential hypertension 09/17/2014   Pure hypercholesterolemia 09/17/2014   Nonspecific (abnormal) findings on radiological and other examination of gastrointestinal tract 02/22/2013   PCP:  Lynnea Ferrier, MD Pharmacy:   [id number] [place] - [address] AT [facility] OF [address] [facility] [place] [address] Phone: [phone] Fax: [phone]     Social Determinants of Health (SDOH) Interventions    Readmission Risk Interventions No flowsheet data found.

## 2021-09-22 NOTE — Progress Notes (Signed)
    Attending Progress Note  History: Carrie [name] is s/p C4-6 laminectomies for cervical stenosis and myelopathy  POD1: NAEO.  Patient reports good pain control overnight.  She denies any new neurologic symptoms.  Physical Exam: Vitals:   09/22/21 0000 09/22/21 0400  BP: (!) 133/58 120/70  Pulse: (!) 57 70  Resp: 16 16  Temp: (!) 97.1 F (36.2 C) 97.7 F (36.5 C)  SpO2: 97% 97%    AA Ox3, sitting up in bed eating breakfast CNI Strength:5/5 throughout except 4+ in handgrip and interossei bilaterally HV 25 postoperatively  Data:  No results for input(s): NA, K, CL, CO2, BUN, CREATININE, LABGLOM, GLUCOSE, CALCIUM in the last 168 hours. No results for input(s): AST, ALT, ALKPHOS in the last 168 hours.  Invalid input(s): TBILI   No results for input(s): WBC, HGB, HCT, PLT in the last 168 hours. No results for input(s): APTT, INR in the last 168 hours.       Other tests/results: none  Assessment/Plan:  Carrie [name] is a 75 y.o s/p C4-6 laminectomies for spinal cord decompression.  - mobilize; patient is already ambulated to the bathroom and tolerated this well - pain control -Hemovac drain removed today. -Plan to discharge patient home this morning.  Manning Charity PA-C Department of Neurosurgery

## 2021-09-22 NOTE — Progress Notes (Signed)
Patient ambulated to the bathroom with standby assist. Steady gait. Minimal pain.

## 2021-10-27 DIAGNOSIS — M5090 Cervical disc disorder, unspecified, unspecified cervical region: Secondary | ICD-10-CM | POA: Insufficient documentation

## 2021-12-01 DIAGNOSIS — I773 Arterial fibromuscular dysplasia: Secondary | ICD-10-CM | POA: Insufficient documentation

## 2022-03-02 ENCOUNTER — Other Ambulatory Visit: Payer: Self-pay | Admitting: Student

## 2022-03-02 DIAGNOSIS — R404 Transient alteration of awareness: Secondary | ICD-10-CM

## 2022-03-02 DIAGNOSIS — R6889 Other general symptoms and signs: Secondary | ICD-10-CM

## 2022-05-24 DIAGNOSIS — G4733 Obstructive sleep apnea (adult) (pediatric): Secondary | ICD-10-CM | POA: Insufficient documentation

## 2022-08-06 ENCOUNTER — Ambulatory Visit
Admission: RE | Admit: 2022-08-06 | Discharge: 2022-08-06 | Disposition: A | Discharge status: Home / Self Care | Payer: Medicare HMO | Source: Ambulatory Visit

## 2022-08-06 VITALS — BP 149/76 | HR 71 | Temp 98.7°F | Ht 60.0 in | Wt 150.0 lb

## 2022-08-06 DIAGNOSIS — J069 Acute upper respiratory infection, unspecified: Secondary | ICD-10-CM | POA: Diagnosis not present

## 2022-08-06 MED ORDER — BENZONATATE 100 MG PO CAPS: 200.0000 mg | ORAL_CAPSULE | Freq: Three times a day (TID) | ORAL | 0 refills | Status: DC

## 2022-08-06 MED ORDER — PROMETHAZINE-DM 6.25-15 MG/5ML PO SYRP: 5.0000 mL | ORAL_SOLUTION | Freq: Four times a day (QID) | ORAL | 0 refills | Status: DC | PRN

## 2022-08-06 MED ORDER — IPRATROPIUM BROMIDE 0.06 % NA SOLN: 2.0000 | Freq: Four times a day (QID) | NASAL | 12 refills | Status: DC

## 2022-08-06 NOTE — Discharge Instructions (Signed)

## 2022-08-06 NOTE — ED Triage Notes (Addendum)
Pt states she was sick last Friday w/ chills, fever x2 days. Pt states Wednesday-Thursday coughing, congestion . Pt denies any fevers since last week. Pt reports negative home covid test Saturday

## 2022-08-06 NOTE — ED Provider Notes (Signed)
[facility]    CSN: [id number] Arrival date & time: 08/06/22  7588      History   Chief Complaint Chief Complaint  Patient presents with   Cough   Nasal Congestion   Headache    HPI Carrie [name] is a 76 y.o. female.   HPI  76 year old female here for evaluation of respiratory complaints.  Patient reports that her symptoms began 1 week ago and they consisted of fever and nasal congestion.  The fever lasted for 2 days and then resolved.  She still continues to have nasal congestion, nonproductive cough, and some intermittent shortness of breath.  Also headache.  She was around another person last week when her symptoms began had similar symptoms and tested positive for COVID.  She states that she took a home test and was negative.  She denies any ear pain, sore throat, wheezing, GI complaints, or body aches.  Past Medical History:  Diagnosis Date   Anxiety    Arthritis    Cataract cortical, senile    Cellulitis due to MRSA    right leg (hospitalized [facility])   Chronic pain in right foot    Colovaginal fistula    Diverticulosis    Fibrocystic breast    Fibromuscular dysplasia (HCC)    GERD (gastroesophageal reflux disease)    Heart murmur    mild   History of blood transfusion 1998   slight rash with   History of hypertension    off all meds for last 4 years   History of methicillin resistant staphylococcus aureus (MRSA)    History of stomach ulcers    Hypertension    Lichen sclerosus of female genitalia    Migraines    Mild cognitive impairment    PONV (postoperative nausea and vomiting)    just once -takes alot to sedate patient   Thyroid nodule    TIA (transient ischemic attack) 2022   Dr Manuella Ghazi    Patient Active Problem List   Diagnosis Date Noted   Cervical myelopathy ([place]) 09/21/2021   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   Migraine 11/25/2019   Thyroid disease 11/25/2019   B12 deficiency 05/03/2018   Essential hypertension 09/17/2014   Pure  hypercholesterolemia 09/17/2014   Nonspecific (abnormal) findings on radiological and other examination of gastrointestinal tract 02/22/2013    Past Surgical History:  Procedure Laterality Date   ABDOMINAL HYSTERECTOMY  1998   benign breast tumor removed Left 1967   BREAST EXCISIONAL BIOPSY Bilateral 1967 for left, 2010 for right   benign   cataract surgery  8 yrs ago   both eyes   COLON RESECTION  2015   anterior sigmoid   COLON SURGERY     8"colon removed-fistula   COLONOSCOPY WITH PROPOFOL N/A 05/30/2015   Procedure: COLONOSCOPY WITH PROPOFOL;  Surgeon: Hulen Luster, MD;  Location: [facility] ENDOSCOPY;  Service: Gastroenterology;  Laterality: N/A;   EUS N/A 02/22/2013   Procedure: UPPER ENDOSCOPIC ULTRASOUND (EUS) LINEAR;  Surgeon: Milus Banister, MD;  Location: [facility] ENDOSCOPY;  Service: Endoscopy;  Laterality: N/A;   excision cyst index finger Left    extensor tendon and extensor retinacular release   EYE SURGERY Bilateral    cataract   KNEE ARTHROPLASTY Left 11/26/2019   Procedure: COMPUTER ASSISTED TOTAL KNEE ARTHROPLASTY;  Surgeon: Dereck Leep, MD;  Location: [facility] ORS;  Service: Orthopedics;  Laterality: Left;   POSTERIOR CERVICAL LAMINECTOMY N/A 09/21/2021   Procedure: C4-6 LAMINECTOMY;  Surgeon: Deetta Perla, MD;  Location: [facility]  ORS;  Service: Neurosurgery;  Laterality: N/A;   right benign breast lump removed  2009   tummy tuck     WRIST GANGLION EXCISION Left     OB History   No obstetric history on file.      Home Medications    Prior to Admission medications   Medication Sig Start Date End Date Taking? Authorizing Provider  aspirin EC 81 MG tablet Take 1 tablet by mouth daily. 02/05/21  Yes [provider]  atorvastatin (LIPITOR) 40 MG tablet Take 40 mg by mouth at bedtime. 04/30/21  Yes [provider]  benzonatate (TESSALON) 100 MG capsule Take 2 capsules (200 mg total) by mouth every 8 (eight) hours. 08/06/22  Yes Becky Augusta, NP  Cyanocobalamin  (VITAMIN B-12 PO) Take 2,500 mcg by mouth daily.   Yes [provider]  donepezil (ARICEPT) 5 MG tablet Take 5 mg by mouth at bedtime.   Yes [provider]  gabapentin (NEURONTIN) 100 MG capsule Take 200 mg by mouth 3 (three) times daily. 07/01/21  Yes [provider]  ipratropium (ATROVENT) 0.06 % nasal spray Place 2 sprays into both nostrils 4 (four) times daily. 08/06/22  Yes Becky Augusta, NP  losartan (COZAAR) 50 MG tablet Take 50 mg by mouth every morning. 07/14/21  Yes [provider]  Melatonin 5 MG CAPS Take 5 mg by mouth at bedtime as needed (sleep).   Yes [provider]  Multiple Vitamins-Minerals (MULTIVITAMIN WITH MINERALS) tablet Take 1 tablet by mouth daily. 50 +   Yes [provider]  promethazine-dextromethorphan (PROMETHAZINE-DM) 6.25-15 MG/5ML syrup Take 5 mLs by mouth 4 (four) times daily as needed. 08/06/22  Yes Becky Augusta, NP  zinc gluconate 50 MG tablet Take 50 mg by mouth daily.   Yes [provider]  zolmitriptan (ZOMIG-ZMT) 5 MG disintegrating tablet Take 5 mg by mouth as needed for migraine. 05/19/20  Yes [provider]  Ascorbic Acid (VITAMIN C) 1000 MG tablet Take 1,000 mg by mouth daily.    [provider]  diphenhydrAMINE HCl (ZZZQUIL) 50 MG/30ML LIQD Take 30 mLs by mouth at bedtime as needed (sleep).    [provider]  magnesium oxide (MAG-OX) 400 MG tablet Take 400 mg by mouth daily.    [provider]  methocarbamol (ROBAXIN) 500 MG tablet Take 1 tablet (500 mg total) by mouth every 6 (six) hours as needed for muscle spasms. 09/22/21   Susanne Borders, PA  senna (SENOKOT) 8.6 MG TABS tablet Take 1 tablet (8.6 mg total) by mouth 2 (two) times daily. 09/22/21   Susanne Borders, PA    Family History Family History  Problem Relation Age of Onset   Breast cancer Maternal Aunt 65    Social History Social History   Tobacco Use   Smoking status: Never    Smokeless tobacco: Never  Vaping Use   Vaping Use: Never used  Substance Use Topics   Alcohol use: No   Drug use: No     Allergies   Pneumococcal polysaccharide vaccine   Review of Systems Review of Systems  Constitutional:  Positive for fever.  HENT:  Positive for congestion and rhinorrhea. Negative for ear pain and sore throat.   Respiratory:  Positive for cough and shortness of breath. Negative for wheezing.   Gastrointestinal:  Negative for diarrhea, nausea and vomiting.  Musculoskeletal:  Negative for arthralgias and myalgias.  Skin:  Negative for rash.  Neurological:  Positive for headaches.  Hematological:  Negative.   Psychiatric/Behavioral: Negative.       Physical Exam Triage Vital Signs ED Triage Vitals  Enc Vitals Group     BP 08/06/22 0904 (!) 149/76     Pulse Rate 08/06/22 0904 71     Resp --      Temp 08/06/22 0904 98.7 F (37.1 C)     Temp Source 08/06/22 0904 Oral     SpO2 08/06/22 0904 98 %     Weight 08/06/22 0902 150 lb (68 kg)     Height 08/06/22 0902 5' (1.524 m)     Head Circumference --      Peak Flow --      Pain Score 08/06/22 0902 5     Pain Loc --      Pain Edu? --      Excl. in GC? --    No data found.  Updated Vital Signs BP (!) 149/76 (BP Location: Left Arm)   Pulse 71   Temp 98.7 F (37.1 C) (Oral)   Ht 5' (1.524 m)   Wt 150 lb (68 kg)   SpO2 98%   BMI 29.29 kg/m   Visual Acuity Right Eye Distance:   Left Eye Distance:   Bilateral Distance:    Right Eye Near:   Left Eye Near:    Bilateral Near:     Physical Exam Vitals and nursing note reviewed.  Constitutional:      Appearance: Normal appearance. She is not ill-appearing.  HENT:     Head: Normocephalic and atraumatic.     Right Ear: Tympanic membrane, ear canal and external ear normal. There is no impacted cerumen.     Left Ear: Tympanic membrane, ear canal and external ear normal. There is no impacted cerumen.     Nose: Congestion and rhinorrhea present.      Mouth/Throat:     Mouth: Mucous membranes are moist.     Pharynx: Oropharynx is clear. Posterior oropharyngeal erythema present. No oropharyngeal exudate.  Cardiovascular:     Rate and Rhythm: Normal rate and regular rhythm.     Pulses: Normal pulses.     Heart sounds: Normal heart sounds. No murmur heard.    No friction rub. No gallop.  Pulmonary:     Effort: Pulmonary effort is normal.     Breath sounds: Normal breath sounds. No wheezing, rhonchi or rales.  Musculoskeletal:     Cervical back: Normal range of motion and neck supple.  Lymphadenopathy:     Cervical: No cervical adenopathy.  Skin:    General: Skin is warm and dry.     Capillary Refill: Capillary refill takes less than 2 seconds.     Findings: No erythema or rash.  Neurological:     General: No focal deficit present.     Mental Status: She is alert and oriented to person, place, and time.  Psychiatric:        Mood and Affect: Mood normal.        Behavior: Behavior normal.        Thought Content: Thought content normal.        Judgment: Judgment normal.      UC Treatments / Results  Labs (all labs ordered are listed, but only abnormal results are displayed) Labs Reviewed - No data to display  EKG   Radiology No results found.  Procedures Procedures (including critical care time)  Medications Ordered in UC Medications - No data to display  Initial Impression / Assessment and  Plan / UC Course  I have reviewed the triage vital signs and the nursing notes.  Pertinent labs & imaging results that were available during my care of the patient were reviewed by me and considered in my medical decision making (see chart for details).   Patient is a nontoxic-appearing 15 old female who is in no respiratory distress and is able to speak in full sentences without any dyspnea or tachypnea presenting for evaluation of respiratory complaints as outlined in HPI above.  She was exposed to COVID but states she took a  home COVID test that was negative.  I did advise the patient that there is a chance that her home COVID test was a false negative.  Patient does have a significant past medical history to include essential hypertension, pure hypercholesterolemia, thyroid disease, and migraines.  She states that she is worried she may develop bronchitis and will not be able to sing at church.  Her physical exam reveals some signs of an upper respiratory infection with nasal congestion and clear rhinorrhea.  She also has posterior pharyngeal erythema with clear postnasal drip.  No cervical lymphadenopathy on exam.  Cardiopulmonary exam reveals good lung sounds in all fields.  I suspect the patient has a viral upper respiratory faction.  It may very well have been COVID but she has been experiencing symptoms for the last 7 days.  She is outside the window for both quarantine and antiviral therapy.  I will treat her with Atrovent nasal spray, Tessalon Perles, and Promethazine DM to help with her symptoms.  Return precautions reviewed.   Final Clinical Impressions(s) / UC Diagnoses   Final diagnoses:  Viral URI with cough     Discharge Instructions      Use the Atrovent nasal spray, 2 squirts in each nostril every 6 hours, as needed for runny nose and postnasal drip.  Use the Tessalon Perles every 8 hours during the day.  Take them with a small sip of water.  They may give you some numbness to the base of your tongue or a metallic taste in your mouth, this is normal.  Use the Promethazine DM cough syrup at bedtime for cough and congestion.  It will make you drowsy so do not take it during the day.  Return for reevaluation or see your primary care provider for any new or worsening symptoms.      ED Prescriptions     Medication Sig Dispense Auth. Provider   benzonatate (TESSALON) 100 MG capsule Take 2 capsules (200 mg total) by mouth every 8 (eight) hours. 21 capsule Becky Augusta, NP   ipratropium (ATROVENT)  0.06 % nasal spray Place 2 sprays into both nostrils 4 (four) times daily. 15 mL Becky Augusta, NP   promethazine-dextromethorphan (PROMETHAZINE-DM) 6.25-15 MG/5ML syrup Take 5 mLs by mouth 4 (four) times daily as needed. 118 mL Becky Augusta, NP      PDMP not reviewed this encounter.   Becky Augusta, NP 08/06/22 [phone]

## 2022-08-12 ENCOUNTER — Other Ambulatory Visit: Payer: Self-pay | Admitting: Internal Medicine

## 2022-08-12 DIAGNOSIS — Z1231 Encounter for screening mammogram for malignant neoplasm of breast: Secondary | ICD-10-CM

## 2022-08-30 ENCOUNTER — Encounter (INDEPENDENT_AMBULATORY_CARE_PROVIDER_SITE_OTHER): Payer: Self-pay

## 2022-09-08 ENCOUNTER — Ambulatory Visit
Admission: RE | Admit: 2022-09-08 | Discharge: 2022-09-08 | Disposition: A | Discharge status: Home / Self Care | Payer: Medicare HMO | Source: Ambulatory Visit | Attending: Internal Medicine | Admitting: Internal Medicine

## 2022-09-08 DIAGNOSIS — Z1231 Encounter for screening mammogram for malignant neoplasm of breast: Secondary | ICD-10-CM | POA: Insufficient documentation

## 2022-11-01 NOTE — Discharge Instructions (Signed)
Instructions after Total Knee Replacement   Takoda Janowiak P. Tyrena Gohr, Jr., M.D.     Dept. of Orthopaedics & Sports Medicine  [facility]  [address]  [place], [place]  [address]  Phone: [phone]   Fax: [phone]    DIET: Drink plenty of non-alcoholic fluids. Resume your normal diet. Include foods high in fiber.  ACTIVITY:  You may use crutches or a walker with weight-bearing as tolerated, unless instructed otherwise. You may be weaned off of the walker or crutches by your Physical Therapist.  Do NOT place pillows under the knee. Anything placed under the knee could limit your ability to straighten the knee.   Continue doing gentle exercises. Exercising will reduce the pain and swelling, increase motion, and prevent muscle weakness.   Please continue to use the TED compression stockings for 6 weeks. You may remove the stockings at night, but should reapply them in the morning. Do not drive or operate any equipment until instructed.  WOUND CARE:  Continue to use the PolarCare or ice packs periodically to reduce pain and swelling. You may bathe or shower after the staples are removed at the first office visit following surgery.  MEDICATIONS: You may resume your regular medications. Please take the pain medication as prescribed on the medication. Do not take pain medication on an empty stomach. You have been given a prescription for a blood thinner (Lovenox or Coumadin). Please take the medication as instructed. (NOTE: After completing a 2 week course of Lovenox, take one Enteric-coated aspirin once a day. This along with elevation will help reduce the possibility of phlebitis in your operated leg.) Do not drive or drink alcoholic beverages when taking pain medications.  CALL THE OFFICE FOR: Temperature above 101 degrees Excessive bleeding or drainage on the dressing. Excessive swelling, coldness, or paleness of the toes. Persistent nausea and vomiting.  FOLLOW-UP:  You  should have an appointment to return to the office in 10-14 days after surgery. Arrangements have been made for continuation of Physical Therapy (either home therapy or outpatient therapy).   [facility] Department Directory         [email or web address]       [email or web address]          Cardiology  Appointments: [place] - [phone] [place] - [phone]  Endocrinology  Appointments: [place] - [phone] [place] - [phone]  Gastroenterology  Appointments: [place] - [phone] [place] - [phone]        General Surgery   Appointments: [place] - [phone]  Internal Medicine/Family Medicine  Appointments: [place] - [phone] [place] - [phone] [place] - [phone]  Metabolic and Weigh Loss Surgery  Appointments: [place] - [phone]        Neurology  Appointments: [place] - [phone] [place] - [phone]  Neurosurgery  Appointments: [place] - [phone]  Obstetrics & Gynecology  Appointments: [place] - [phone] [place] - [phone]        Pediatrics  Appointments: [place] - [phone] [place] - [phone]  Physiatry  Appointments: [place] -[phone]  Physical Therapy  Appointments: [place] - [phone] [place] - [phone]        Podiatry  Appointments: [place] - [phone] [place] - [phone]  Pulmonology  Appointments: [place] - [phone]  Rheumatology  Appointments:  - [phone]         Location: [facility]  [address] , [place]  [address]  [place] Location: [facility] [address] [place], [place]  [address]  [place] Location: [facility] [address] [place], [place]  [address]    

## 2022-11-03 ENCOUNTER — Encounter
Admission: RE | Admit: 2022-11-03 | Discharge: 2022-11-03 | Disposition: A | Discharge status: Home / Self Care | Payer: Medicare HMO | Source: Ambulatory Visit | Attending: Orthopedic Surgery | Admitting: Orthopedic Surgery

## 2022-11-03 ENCOUNTER — Other Ambulatory Visit: Payer: Self-pay

## 2022-11-03 VITALS — BP 144/62 | HR 79 | Temp 98.1°F | Resp 16 | Ht 60.0 in | Wt 157.4 lb

## 2022-11-03 DIAGNOSIS — M1711 Unilateral primary osteoarthritis, right knee: Secondary | ICD-10-CM | POA: Insufficient documentation

## 2022-11-03 DIAGNOSIS — Z01812 Encounter for preprocedural laboratory examination: Secondary | ICD-10-CM

## 2022-11-03 DIAGNOSIS — Z01818 Encounter for other preprocedural examination: Secondary | ICD-10-CM | POA: Diagnosis not present

## 2022-11-03 LAB — SURGICAL PCR SCREEN
MRSA, PCR: NEGATIVE
Staphylococcus aureus: NEGATIVE

## 2022-11-03 LAB — SEDIMENTATION RATE: Sed Rate: 12 mm/hr (ref 0–30)

## 2022-11-03 LAB — URINALYSIS, ROUTINE W REFLEX MICROSCOPIC
Bacteria, UA: NONE SEEN
Bilirubin Urine: NEGATIVE
Glucose, UA: NEGATIVE mg/dL
Hgb urine dipstick: NEGATIVE
Ketones, ur: NEGATIVE mg/dL
Nitrite: NEGATIVE
Protein, ur: NEGATIVE mg/dL
Specific Gravity, Urine: 1.02 (ref 1.005–1.030)
pH: 6 (ref 5.0–8.0)

## 2022-11-03 LAB — TYPE AND SCREEN
ABO/RH(D): O POS
Antibody Screen: NEGATIVE

## 2022-11-03 LAB — C-REACTIVE PROTEIN: CRP: 0.8 mg/dL (ref <1.0)

## 2022-11-03 NOTE — Patient Instructions (Addendum)
Your procedure is scheduled on: Monday November 15, 2022. Report to Day Surgery inside [place] 2nd floor, stop  by registration desk before getting on elevator. To find out your arrival time please call [phone] between 1PM - 3PM on Friday November 12, 2022.  Remember: Instructions that are not followed completely may result in serious medical risk,  up to and including death, or upon the discretion of your surgeon and anesthesiologist your  surgery may need to be rescheduled.     _X__ 1. Do not eat food after midnight the night before your procedure.                 No chewing gum or hard candies. You may drink clear liquids up to 2 hours                 before you are scheduled to arrive for your surgery- DO not drink clear                 liquids within 2 hours of the start of your surgery.                 Clear Liquids include:  water, apple juice without pulp, clear Gatorade, G2 or                  Gatorade Zero (avoid Red/Purple/Blue), Black Coffee or Tea (Do not add                 anything to coffee or tea).  __X__2.   Complete the "Ensure Clear Pre-surgery Clear Carbohydrate Drink" provided to you, 2 hours before arrival. **If you are diabetic you will be provided with an alternative drink, Gatorade Zero or G2.  __X__3.  On the morning of surgery brush your teeth with toothpaste and water, you                may rinse your mouth with mouthwash if you wish.  Do not swallow any toothpaste of mouthwash.     _X__ 4.  No Alcohol for 24 hours before or after surgery.   _X__ 5.  Do Not Smoke or use e-cigarettes For 24 Hours Prior to Your Surgery.                 Do not use any chewable tobacco products for at least 6 hours prior to                 Surgery.  _X__  6.  Do not use any recreational drugs (marijuana, cocaine, heroin, ecstasy, MDMA or other)                For at least one week prior to your surgery.  Combination of these drugs with  anesthesia                May have life threatening results.  ____  7.  Bring all medications with you on the day of surgery if instructed.   __X__ 8.  Notify your doctor if there is any change in your medical condition      (cold, fever, infections).     Do not wear jewelry, make-up, hairpins, clips or nail polish. Do not wear lotions, powders, or perfumes. You may wear deodorant. Do not shave 48 hours prior to surgery. Men may shave face and neck. Do not bring valuables to the hospital.    [facility] is not responsible for any belongings or valuables.  Contacts, dentures or bridgework may not be worn into surgery. Leave your suitcase in the car. After surgery it may be brought to your room. For patients admitted to the hospital, discharge time is determined by your treatment team.   Patients discharged the day of surgery will not be allowed to drive home.   Make arrangements for someone to be with you for the first 24 hours of your Same Day Discharge.   __X__ Take these medicines the morning of surgery with A SIP OF WATER:    1. gabapentin (NEURONTIN) 300 MG   2.   3.   4.  5.  6.  ____ Fleet Enema (as directed)   __X__ Use CHG Soap (or wipes) as directed  ____ Use Benzoyl Peroxide Gel as instructed  ____ Use inhalers on the day of surgery  ____ Stop metformin 2 days prior to surgery    ____ Take 1/2 of usual insulin dose the night before surgery. No insulin the morning          of surgery.   __X__ Stop aspirin 81 mg 1 week prior to surgery as instructed by your doctor.   __X__ One Week prior to surgery- Stop Anti-inflammatories such as Ibuprofen, Aleve, Advil, Motrin, meloxicam (MOBIC), diclofenac, etodolac, ketorolac, Toradol, Daypro, piroxicam, Goody's or BC powders. OK TO USE TYLENOL IF NEEDED   __X__ Stop supplements until after surgery.    ____ Bring C-Pap to the hospital.    If you have any questions regarding your pre-procedure instructions,  Please  call Pre-admit Testing at [phone]    Preparing for Surgery with CHLORHEXIDINE GLUCONATE (CHG) Soap  Chlorhexidine Gluconate (CHG) Soap  o An antiseptic cleaner that kills germs and bonds with the skin to continue killing germs even after washing  o Used for showering the night before surgery and morning of surgery  Before surgery, you can play an important role by reducing the number of germs on your skin.  CHG (Chlorhexidine gluconate) soap is an antiseptic cleanser which kills germs and bonds with the skin to continue killing germs even after washing.  Please do not use if you have an allergy to CHG or antibacterial soaps. If your skin becomes reddened/irritated stop using the CHG.  1. Shower the NIGHT BEFORE SURGERY and the MORNING OF SURGERY with CHG soap.  2. If you choose to wash your hair, wash your hair first as usual with your normal shampoo.  3. After shampooing, rinse your hair and body thoroughly to remove the shampoo.  4. Use CHG as you would any other liquid soap. You can apply CHG directly to the skin and wash gently with a scrungie or a clean washcloth.  5. Apply the CHG soap to your body only from the neck down. Do not use on open wounds or open sores. Avoid contact with your eyes, ears, mouth, and genitals (private parts). Wash face and genitals (private parts) with your normal soap.  6. Wash thoroughly, paying special attention to the area where your surgery will be performed.  7. Thoroughly rinse your body with warm water.  8. Do not shower/wash with your normal soap after using and rinsing off the CHG soap.  9. Pat yourself dry with a clean towel.  10. Wear clean pajamas to bed the night before surgery.  12. Place clean sheets on your bed the night of your first shower and do not sleep with pets.  13. Shower again with the CHG soap on the day of surgery  prior to arriving at the hospital.  14. Do not apply any deodorants/lotions/powders.  15. Please  wear clean clothes to the hospital.

## 2022-11-14 ENCOUNTER — Encounter: Payer: Self-pay | Admitting: Orthopedic Surgery

## 2022-11-14 NOTE — H&P (Signed)
ORTHOPAEDIC HISTORY & PHYSICAL Regino Bellow, PA - 11/02/2022 3:15 PM EST Formatting of this note is different from the original. Images from the original note were not included. Chief Complaint Chief Complaint Patient presents with Pre-op Exam  Reason for Visit Carrie [name] is a 77 y.o. who presents today for history and physical. She is to undergo a right total knee arthroplasty on 11/15/2022. Since the patient was last here in the clinic there have been no change in her condition. She wishes to proceed with having surgery to the right knee.  She reports a several year history of right knee pain that has significant increased over the last 12 months. She localizes most of the pain along the lateral aspect of the knee. She reports some swelling, no locking, and significant giving way of the knee. She is concerned that her balance is compromised due to the right knee. The pain is aggravated by any weight bearing. The knee pain limits the patient's ability to ambulate long distances.The patient has not appreciated any significant improvement despite activity modification. She is not using any ambulatory aids. The patient states that the knee pain has progressed to the point that it is significantly interfering with her activities of daily living.  Past Medical History Past Medical History: Diagnosis Date Arthritis a.Hands. b.Left knee. c.Trigger nodules. d. Ganglion. Cataract cortical, senile Cervical disc disease 10/27/2021 S/p c4-6 laminectomy 2022 Chronic pain in right foot Colovaginal fistula Diverticulosis 05/30/2015 Fibrocystic breast disease Hypertension Lichen sclerosus of female genitalia Migraine Mitral regurgitation echo 6/13 with normal LVEF, mild MR with mild LAE, and normal right heart with normal pressures. MRSA cellulitis right leg; hospitalized at [facility] Thyroid disease thyroid nodule  Past Surgical History Past Surgical History: Procedure Laterality  Date Benign breast tumor [place] with BSO Tummy tuck 1998 COLONOSCOPY 04/01/2005 Normal Colon: CBF 04/2015 Excision cyst left index finger 07/07/2010 extensor tendon and extensor retinacular release lower anterior resection of sigmoid colon 04/2014 Dr. Tamala Julian COLONOSCOPY 05/30/2015 Diverticulosis/Otherwise normal/Repeat 106yrs/PYO Left total knee arthroplasty using computer-assisted navigation 11/26/2019 Dr Marry Guan C4-6 LAMINECTOMY 09/21/2021 Dr. Deetta Perla at [place]  Past Family History Family History Problem Relation Age of Onset Alzheimer's disease Mother High blood pressure (Hypertension) Mother Pancreatic cancer Mother Cancer Mother Dementia Mother Migraines Mother Coronary Artery Disease (Blocked arteries around heart) Father Skin cancer Sister Cancer Sister Skin cancer Brother Cancer Brother Pancreatic cancer Maternal Grandmother Pancreatic cancer Brother Breast cancer Paternal Aunt x2 Migraines Daughter  Medications Current Outpatient Medications Ordered in Epic Medication Sig Dispense Refill amoxicillin (AMOXIL) 500 MG capsule Take 1 capsule (500 mg total) by mouth 2 (two) times daily For dental work aspirin 81 MG EC tablet Take 81 mg by mouth once daily aspirin/acetaminophen/caffeine (EXCEDRIN MIGRAINE ORAL) Take 2 tablets by mouth every 6 (six) hours as needed atorvastatin (LIPITOR) 40 MG tablet Take 40 mg by mouth once daily COLLAGEN MISC Use cyanocobalamin, vitamin B-12, (VITAMIN B12 ORAL) Take 1 tablet by mouth once daily One daily donepeziL (ARICEPT) 5 MG tablet TAKE 1 TABLET(5 MG) BY MOUTH AT BEDTIME 90 tablet 1 gabapentin (NEURONTIN) 300 MG capsule TAKE 1 CAPSULE(300 MG) BY MOUTH THREE TIMES DAILY 270 capsule 1 losartan (COZAAR) 50 MG tablet Take 1 tablet (50 mg total) by mouth once daily 90 tablet 1 melatonin 5 mg Cap Take 1 capsule by mouth nightly as needed multivitamin tablet Take 1 tablet by mouth once daily  womens over 50 polyethylene glycol (MIRALAX) powder Take 17 g by mouth  once daily Mix in 4-8ounces of fluid prior to taking. ZOLMitriptan (ZOMIG-ZMT) 5 MG disintegrating tablet Take 1 tablet (5 mg total) by mouth once daily as needed for Migraine 10 tablet 3  No current Epic-ordered facility-administered medications on file.  Allergies Allergies Allergen Reactions Pneumococcal 23-Valent Polysaccharide Vaccine Rash Severe local reaction   Review of Systems A comprehensive 14 point ROS was performed, reviewed, and the pertinent orthopaedic findings are documented in the HPI.  Exam BP 108/72  Ht 152.4 cm (5')  Wt 72.6 kg (160 lb)  BMI 31.25 kg/m  General: Well-developed well-nourished female seen in no acute distress.  HEENT: Atraumatic,normocephalic. Pupils are equal and reactive to light. Oropharynx is clear with moist mucosa  Lungs: Clear to auscultation bilaterally  Cardiovascular: Regular rate and rhythm. Normal S1, S2. Has a 3/6 murmur. States her primary care is aware of this.. No appreciable gallops or rubs. Peripheral pulses are palpable.  Abdomen: Soft, non-tender, nondistended. Bowel sounds present  Extremity: Right Knee: Soft tissue swelling: mild Effusion: none Erythema: none Crepitance: mild Tenderness: lateral Alignment: relative valgus Mediolateral laxity: lateral pseudolaxity Posterior sag: negative Patellar tracking: Good tracking without evidence of subluxation or tilt Atrophy: No significant atrophy. Quadriceps tone was fair to good. Range of motion: 0/0/128 degrees  Neurological:  The patient is alert and oriented Sensation to light touch appears to be intact and within normal limits Gross motor strength appeared to be equal to 5/5  Vascular :  Peripheral pulses felt to be palpable. Capillary refill appears to be intact and within normal limits  X-ray  AP standing, lateral and sunrise view of the right knee ordered and interpreted on  today's visit shows significant narrowing of the lateral cartilage space with associated valgus alignment. Osteophyte formation is noted. Subchondral sclerosis is noted. No evidence of fracture or dislocation.  Impression  1. Degenerative arthrosis left knee  Plan  1. Patient is to discontinue her aspirin 1 week prior to surgery 2. Did discuss postop rehab 3. Return to clinic as scheduled postop. Sooner if any problems  This note was generated in part with voice recognition software and I apologize for any typographical errors that were not detected and corrected   Carrie Climes PA Electronically signed by Regino Bellow, PA at 11/03/2022 9:04 AM EST

## 2022-11-15 ENCOUNTER — Encounter
Admission: RE | Disposition: A | Discharge status: Home / Self Care | Payer: Self-pay | Source: Ambulatory Visit | Attending: Orthopedic Surgery

## 2022-11-15 ENCOUNTER — Ambulatory Visit: Payer: Medicare HMO | Admitting: Certified Registered"

## 2022-11-15 ENCOUNTER — Observation Stay: Payer: Medicare HMO

## 2022-11-15 ENCOUNTER — Encounter: Payer: Self-pay | Admitting: Orthopedic Surgery

## 2022-11-15 ENCOUNTER — Other Ambulatory Visit: Payer: Self-pay

## 2022-11-15 ENCOUNTER — Observation Stay
Admission: RE | Admit: 2022-11-15 | Discharge: 2022-11-16 | Disposition: A | Discharge status: Home / Self Care | Payer: Medicare HMO | Source: Ambulatory Visit | Attending: Orthopedic Surgery | Admitting: Orthopedic Surgery

## 2022-11-15 DIAGNOSIS — I10 Essential (primary) hypertension: Secondary | ICD-10-CM | POA: Insufficient documentation

## 2022-11-15 DIAGNOSIS — Z7982 Long term (current) use of aspirin: Secondary | ICD-10-CM | POA: Diagnosis not present

## 2022-11-15 DIAGNOSIS — M1711 Unilateral primary osteoarthritis, right knee: Principal | ICD-10-CM | POA: Insufficient documentation

## 2022-11-15 DIAGNOSIS — Z96659 Presence of unspecified artificial knee joint: Secondary | ICD-10-CM

## 2022-11-15 DIAGNOSIS — E538 Deficiency of other specified B group vitamins: Secondary | ICD-10-CM

## 2022-11-15 DIAGNOSIS — Z79899 Other long term (current) drug therapy: Secondary | ICD-10-CM | POA: Diagnosis not present

## 2022-11-15 DIAGNOSIS — E079 Disorder of thyroid, unspecified: Secondary | ICD-10-CM

## 2022-11-15 DIAGNOSIS — Z96652 Presence of left artificial knee joint: Secondary | ICD-10-CM | POA: Diagnosis not present

## 2022-11-15 HISTORY — PX: KNEE ARTHROPLASTY: SHX992

## 2022-11-15 SURGERY — ARTHROPLASTY, KNEE, TOTAL, USING IMAGELESS COMPUTER-ASSISTED NAVIGATION
Anesthesia: Monitor Anesthesia Care | Site: Knee | Laterality: Right

## 2022-11-15 MED ORDER — ACETAMINOPHEN 10 MG/ML IV SOLN
INTRAVENOUS | Status: AC
  Filled 2022-11-15: qty 100

## 2022-11-15 MED ORDER — OXYCODONE HCL 5 MG PO TABS
ORAL_TABLET | ORAL | Status: AC
  Filled 2022-11-15: qty 2

## 2022-11-15 MED ORDER — LIDOCAINE HCL (PF) 2 % IJ SOLN
INTRAMUSCULAR | Status: AC
  Filled 2022-11-15: qty 5

## 2022-11-15 MED ORDER — SUMATRIPTAN SUCCINATE 50 MG PO TABS: 50.0000 mg | ORAL_TABLET | ORAL | Status: DC | PRN

## 2022-11-15 MED ORDER — ONDANSETRON HCL 4 MG PO TABS: 4.0000 mg | ORAL_TABLET | Freq: Four times a day (QID) | ORAL | Status: DC | PRN

## 2022-11-15 MED ORDER — ENOXAPARIN SODIUM 30 MG/0.3ML IJ SOSY
30.0000 mg | PREFILLED_SYRINGE | Freq: Two times a day (BID) | INTRAMUSCULAR | Status: DC
  Administered 2022-11-16: 30 mg via SUBCUTANEOUS

## 2022-11-15 MED ORDER — GABAPENTIN 100 MG PO CAPS
ORAL_CAPSULE | ORAL | Status: AC
  Filled 2022-11-15: qty 2

## 2022-11-15 MED ORDER — ACETAMINOPHEN 10 MG/ML IV SOLN: 1000.0000 mg | Freq: Four times a day (QID) | INTRAVENOUS | Status: DC

## 2022-11-15 MED ORDER — PANTOPRAZOLE SODIUM 40 MG PO TBEC
DELAYED_RELEASE_TABLET | ORAL | Status: AC
  Administered 2022-11-15: 40 mg
  Filled 2022-11-15: qty 1

## 2022-11-15 MED ORDER — GABAPENTIN 300 MG PO CAPS
ORAL_CAPSULE | ORAL | Status: AC
  Filled 2022-11-15: qty 1

## 2022-11-15 MED ORDER — ONDANSETRON HCL 4 MG/2ML IJ SOLN
INTRAMUSCULAR | Status: AC
  Filled 2022-11-15: qty 2

## 2022-11-15 MED ORDER — BISACODYL 10 MG RE SUPP: 10.0000 mg | Freq: Every day | RECTAL | Status: DC | PRN

## 2022-11-15 MED ORDER — HYDROMORPHONE HCL 1 MG/ML IJ SOLN: 0.5000 mg | INTRAMUSCULAR | Status: DC | PRN

## 2022-11-15 MED ORDER — ALUM & MAG HYDROXIDE-SIMETH 200-200-20 MG/5ML PO SUSP: 30.0000 mL | ORAL | Status: DC | PRN

## 2022-11-15 MED ORDER — PHENYLEPHRINE 80 MCG/ML (10ML) SYRINGE FOR IV PUSH (FOR BLOOD PRESSURE SUPPORT)
PREFILLED_SYRINGE | INTRAVENOUS | Status: DC | PRN
  Administered 2022-11-15 (×9): 80 ug via INTRAVENOUS

## 2022-11-15 MED ORDER — 0.9 % SODIUM CHLORIDE (POUR BTL) OPTIME
TOPICAL | Status: DC | PRN
  Administered 2022-11-15: 500 mL

## 2022-11-15 MED ORDER — FENTANYL CITRATE (PF) 100 MCG/2ML IJ SOLN
INTRAMUSCULAR | Status: DC | PRN
  Administered 2022-11-15: 25 ug via INTRAVENOUS

## 2022-11-15 MED ORDER — LACTATED RINGERS IV SOLN: INTRAVENOUS | Status: DC

## 2022-11-15 MED ORDER — ATORVASTATIN CALCIUM 20 MG PO TABS
40.0000 mg | ORAL_TABLET | Freq: Every day | ORAL | Status: DC
  Administered 2022-11-15: 40 mg via ORAL

## 2022-11-15 MED ORDER — MENTHOL 3 MG MT LOZG: 1.0000 | LOZENGE | OROMUCOSAL | Status: DC | PRN

## 2022-11-15 MED ORDER — DEXAMETHASONE SODIUM PHOSPHATE 10 MG/ML IJ SOLN
INTRAMUSCULAR | Status: AC
  Administered 2022-11-15: 8 mg via INTRAVENOUS
  Filled 2022-11-15: qty 1

## 2022-11-15 MED ORDER — OXYCODONE HCL 5 MG PO TABS
10.0000 mg | ORAL_TABLET | ORAL | Status: DC | PRN
  Administered 2022-11-15 (×2): 10 mg via ORAL

## 2022-11-15 MED ORDER — SODIUM CHLORIDE 0.9 % IV SOLN
INTRAVENOUS | Status: DC | PRN
  Administered 2022-11-15: 60 mL

## 2022-11-15 MED ORDER — CHLORHEXIDINE GLUCONATE 4 % EX LIQD
60.0000 mL | Freq: Once | CUTANEOUS | Status: AC
  Administered 2022-11-15: 4 via TOPICAL

## 2022-11-15 MED ORDER — ACETAMINOPHEN 10 MG/ML IV SOLN
INTRAVENOUS | Status: DC | PRN
  Administered 2022-11-15: 1000 mg via INTRAVENOUS

## 2022-11-15 MED ORDER — SODIUM CHLORIDE 0.9 % IR SOLN
Status: DC | PRN
  Administered 2022-11-15: 3000 mL

## 2022-11-15 MED ORDER — SURGIPHOR WOUND IRRIGATION SYSTEM - OPTIME
TOPICAL | Status: DC | PRN
  Administered 2022-11-15: 450 mL via TOPICAL

## 2022-11-15 MED ORDER — PHENYLEPHRINE 80 MCG/ML (10ML) SYRINGE FOR IV PUSH (FOR BLOOD PRESSURE SUPPORT)
PREFILLED_SYRINGE | INTRAVENOUS | Status: AC
  Filled 2022-11-15: qty 20

## 2022-11-15 MED ORDER — FAMOTIDINE 20 MG PO TABS: 20.0000 mg | ORAL_TABLET | Freq: Once | ORAL | Status: AC

## 2022-11-15 MED ORDER — CEFAZOLIN SODIUM-DEXTROSE 2-4 GM/100ML-% IV SOLN
2.0000 g | INTRAVENOUS | Status: AC
  Administered 2022-11-15: 2 g via INTRAVENOUS

## 2022-11-15 MED ORDER — GABAPENTIN 100 MG PO CAPS
ORAL_CAPSULE | ORAL | Status: AC
  Administered 2022-11-15: 200 mg via ORAL
  Filled 2022-11-15: qty 2

## 2022-11-15 MED ORDER — PROPOFOL 1000 MG/100ML IV EMUL
INTRAVENOUS | Status: AC
  Filled 2022-11-15: qty 100

## 2022-11-15 MED ORDER — FLEET ENEMA 7-19 GM/118ML RE ENEM: 1.0000 | ENEMA | Freq: Once | RECTAL | Status: DC | PRN

## 2022-11-15 MED ORDER — DONEPEZIL HCL 5 MG PO TABS
5.0000 mg | ORAL_TABLET | Freq: Every day | ORAL | Status: DC
  Administered 2022-11-15: 5 mg via ORAL
  Filled 2022-11-15: qty 1

## 2022-11-15 MED ORDER — ONDANSETRON HCL 4 MG/2ML IJ SOLN: 4.0000 mg | Freq: Once | INTRAMUSCULAR | Status: DC | PRN

## 2022-11-15 MED ORDER — FERROUS SULFATE 325 (65 FE) MG PO TABS
325.0000 mg | ORAL_TABLET | Freq: Two times a day (BID) | ORAL | Status: DC
  Administered 2022-11-16: 325 mg via ORAL

## 2022-11-15 MED ORDER — TRANEXAMIC ACID-NACL 1000-0.7 MG/100ML-% IV SOLN
INTRAVENOUS | Status: AC
  Administered 2022-11-15: 1000 mg via INTRAVENOUS
  Filled 2022-11-15: qty 100

## 2022-11-15 MED ORDER — CELECOXIB 200 MG PO CAPS
ORAL_CAPSULE | ORAL | Status: AC
  Filled 2022-11-15: qty 1

## 2022-11-15 MED ORDER — TRANEXAMIC ACID-NACL 1000-0.7 MG/100ML-% IV SOLN: 1000.0000 mg | Freq: Once | INTRAVENOUS | Status: AC

## 2022-11-15 MED ORDER — ACETAMINOPHEN 10 MG/ML IV SOLN
1000.0000 mg | Freq: Four times a day (QID) | INTRAVENOUS | Status: DC
  Administered 2022-11-16: 1000 mg via INTRAVENOUS

## 2022-11-15 MED ORDER — ACETAMINOPHEN 10 MG/ML IV SOLN
INTRAVENOUS | Status: AC
  Administered 2022-11-15: 1000 mg via INTRAVENOUS
  Filled 2022-11-15: qty 100

## 2022-11-15 MED ORDER — TRANEXAMIC ACID-NACL 1000-0.7 MG/100ML-% IV SOLN
1000.0000 mg | INTRAVENOUS | Status: AC
  Administered 2022-11-15: 1000 mg via INTRAVENOUS

## 2022-11-15 MED ORDER — PHENOL 1.4 % MT LIQD: 1.0000 | OROMUCOSAL | Status: DC | PRN

## 2022-11-15 MED ORDER — ATORVASTATIN CALCIUM 20 MG PO TABS
ORAL_TABLET | ORAL | Status: AC
  Filled 2022-11-15: qty 2

## 2022-11-15 MED ORDER — DIPHENHYDRAMINE HCL 12.5 MG/5ML PO ELIX: 12.5000 mg | ORAL_SOLUTION | ORAL | Status: DC | PRN

## 2022-11-15 MED ORDER — BUPIVACAINE HCL (PF) 0.25 % IJ SOLN
INTRAMUSCULAR | Status: DC | PRN
  Administered 2022-11-15: 60 mL

## 2022-11-15 MED ORDER — CEFAZOLIN SODIUM-DEXTROSE 2-4 GM/100ML-% IV SOLN
INTRAVENOUS | Status: AC
  Filled 2022-11-15: qty 100

## 2022-11-15 MED ORDER — PROPOFOL 10 MG/ML IV BOLUS
INTRAVENOUS | Status: DC | PRN
  Administered 2022-11-15: 20 mg via INTRAVENOUS
  Administered 2022-11-15: 50 ug/kg/min via INTRAVENOUS
  Administered 2022-11-15: 50 mg via INTRAVENOUS

## 2022-11-15 MED ORDER — ZOLMITRIPTAN 5 MG PO TBDP: 5.0000 mg | ORAL_TABLET | ORAL | Status: DC | PRN

## 2022-11-15 MED ORDER — METOCLOPRAMIDE HCL 10 MG PO TABS
ORAL_TABLET | ORAL | Status: AC
  Administered 2022-11-15: 10 mg via ORAL
  Filled 2022-11-15: qty 1

## 2022-11-15 MED ORDER — FAMOTIDINE 20 MG PO TABS
ORAL_TABLET | ORAL | Status: AC
  Administered 2022-11-15: 20 mg via ORAL
  Filled 2022-11-15: qty 1

## 2022-11-15 MED ORDER — FENTANYL CITRATE (PF) 100 MCG/2ML IJ SOLN: 25.0000 ug | INTRAMUSCULAR | Status: DC | PRN

## 2022-11-15 MED ORDER — ORAL CARE MOUTH RINSE: 15.0000 mL | Freq: Once | OROMUCOSAL | Status: AC

## 2022-11-15 MED ORDER — ENSURE PRE-SURGERY PO LIQD
296.0000 mL | Freq: Once | ORAL | Status: AC
  Administered 2022-11-15: 296 mL via ORAL

## 2022-11-15 MED ORDER — TRAMADOL HCL 50 MG PO TABS: 50.0000 mg | ORAL_TABLET | ORAL | Status: DC | PRN

## 2022-11-15 MED ORDER — CELECOXIB 200 MG PO CAPS
ORAL_CAPSULE | ORAL | Status: AC
  Administered 2022-11-15: 400 mg via ORAL
  Filled 2022-11-15: qty 2

## 2022-11-15 MED ORDER — FENTANYL CITRATE (PF) 100 MCG/2ML IJ SOLN
INTRAMUSCULAR | Status: AC
  Filled 2022-11-15: qty 2

## 2022-11-15 MED ORDER — METOCLOPRAMIDE HCL 10 MG PO TABS
ORAL_TABLET | ORAL | Status: AC
  Administered 2022-11-15: 10 mg
  Filled 2022-11-15: qty 2

## 2022-11-15 MED ORDER — GABAPENTIN 100 MG PO CAPS
200.0000 mg | ORAL_CAPSULE | Freq: Three times a day (TID) | ORAL | Status: DC
  Administered 2022-11-15: 200 mg via ORAL

## 2022-11-15 MED ORDER — CEFAZOLIN SODIUM-DEXTROSE 2-4 GM/100ML-% IV SOLN
INTRAVENOUS | Status: AC
  Administered 2022-11-15: 2 g via INTRAVENOUS
  Filled 2022-11-15: qty 100

## 2022-11-15 MED ORDER — METOCLOPRAMIDE HCL 10 MG PO TABS: 10.0000 mg | ORAL_TABLET | Freq: Three times a day (TID) | ORAL | Status: DC

## 2022-11-15 MED ORDER — LIDOCAINE HCL (CARDIAC) PF 100 MG/5ML IV SOSY
PREFILLED_SYRINGE | INTRAVENOUS | Status: DC | PRN
  Administered 2022-11-15: 20 mg via INTRAVENOUS

## 2022-11-15 MED ORDER — CHLORHEXIDINE GLUCONATE 0.12 % MT SOLN: 15.0000 mL | Freq: Once | OROMUCOSAL | Status: AC

## 2022-11-15 MED ORDER — DEXAMETHASONE SODIUM PHOSPHATE 10 MG/ML IJ SOLN: 8.0000 mg | Freq: Once | INTRAMUSCULAR | Status: AC

## 2022-11-15 MED ORDER — GABAPENTIN 300 MG PO CAPS: 300.0000 mg | ORAL_CAPSULE | Freq: Once | ORAL | Status: DC

## 2022-11-15 MED ORDER — SODIUM CHLORIDE 0.9 % IV SOLN: INTRAVENOUS | Status: DC

## 2022-11-15 MED ORDER — PANTOPRAZOLE SODIUM 40 MG PO TBEC: 40.0000 mg | DELAYED_RELEASE_TABLET | Freq: Two times a day (BID) | ORAL | Status: DC

## 2022-11-15 MED ORDER — POLYETHYLENE GLYCOL 3350 17 G PO PACK
17.0000 g | PACK | Freq: Every day | ORAL | Status: DC
  Administered 2022-11-16: 17 g via ORAL
  Filled 2022-11-15 (×2): qty 1

## 2022-11-15 MED ORDER — MAGNESIUM HYDROXIDE 400 MG/5ML PO SUSP: 30.0000 mL | Freq: Every day | ORAL | Status: DC

## 2022-11-15 MED ORDER — ONDANSETRON HCL 4 MG/2ML IJ SOLN
INTRAMUSCULAR | Status: DC | PRN
  Administered 2022-11-15: 4 mg via INTRAVENOUS

## 2022-11-15 MED ORDER — SENNA 8.6 MG PO TABS
ORAL_TABLET | ORAL | Status: AC
  Administered 2022-11-15: 8.6 mg
  Filled 2022-11-15: qty 1

## 2022-11-15 MED ORDER — LOSARTAN POTASSIUM 50 MG PO TABS
50.0000 mg | ORAL_TABLET | ORAL | Status: DC
  Administered 2022-11-16: 50 mg via ORAL

## 2022-11-15 MED ORDER — CHLORHEXIDINE GLUCONATE 0.12 % MT SOLN
OROMUCOSAL | Status: AC
  Administered 2022-11-15: 15 mL via OROMUCOSAL
  Filled 2022-11-15: qty 15

## 2022-11-15 MED ORDER — PROPOFOL 10 MG/ML IV BOLUS
INTRAVENOUS | Status: AC
  Filled 2022-11-15: qty 20

## 2022-11-15 MED ORDER — BUPIVACAINE HCL (PF) 0.5 % IJ SOLN
INTRAMUSCULAR | Status: DC | PRN
  Administered 2022-11-15: 3 mL

## 2022-11-15 MED ORDER — BUPIVACAINE HCL (PF) 0.5 % IJ SOLN
INTRAMUSCULAR | Status: AC
  Filled 2022-11-15: qty 10

## 2022-11-15 MED ORDER — ONDANSETRON HCL 4 MG/2ML IJ SOLN: 4.0000 mg | Freq: Four times a day (QID) | INTRAMUSCULAR | Status: DC | PRN

## 2022-11-15 MED ORDER — CEFAZOLIN SODIUM-DEXTROSE 2-4 GM/100ML-% IV SOLN
2.0000 g | Freq: Four times a day (QID) | INTRAVENOUS | Status: AC
  Administered 2022-11-15: 2 g via INTRAVENOUS

## 2022-11-15 MED ORDER — MAGNESIUM HYDROXIDE 400 MG/5ML PO SUSP
ORAL | Status: AC
  Administered 2022-11-15: 30 mL via ORAL
  Filled 2022-11-15: qty 30

## 2022-11-15 MED ORDER — TRANEXAMIC ACID-NACL 1000-0.7 MG/100ML-% IV SOLN
INTRAVENOUS | Status: AC
  Filled 2022-11-15: qty 100

## 2022-11-15 MED ORDER — ACETAMINOPHEN 325 MG PO TABS: 325.0000 mg | ORAL_TABLET | Freq: Four times a day (QID) | ORAL | Status: DC | PRN

## 2022-11-15 MED ORDER — CELECOXIB 200 MG PO CAPS
200.0000 mg | ORAL_CAPSULE | Freq: Two times a day (BID) | ORAL | Status: DC
  Administered 2022-11-15: 200 mg via ORAL
  Filled 2022-11-15 (×4): qty 1

## 2022-11-15 MED ORDER — OXYCODONE HCL 5 MG PO TABS
5.0000 mg | ORAL_TABLET | ORAL | Status: DC | PRN
  Administered 2022-11-16: 5 mg via ORAL

## 2022-11-15 MED ORDER — PHENYLEPHRINE HCL (PRESSORS) 10 MG/ML IV SOLN
INTRAVENOUS | Status: DC | PRN
  Administered 2022-11-15 (×5): 80 ug via INTRAVENOUS

## 2022-11-15 MED ORDER — CELECOXIB 200 MG PO CAPS: 400.0000 mg | ORAL_CAPSULE | Freq: Once | ORAL | Status: AC

## 2022-11-15 MED ORDER — SENNOSIDES-DOCUSATE SODIUM 8.6-50 MG PO TABS: 1.0000 | ORAL_TABLET | Freq: Two times a day (BID) | ORAL | Status: DC

## 2022-11-15 SURGICAL SUPPLY — 72 items
ATTUNE PSFEM RTSZ6 NARCEM KNEE (Femur) IMPLANT
ATTUNE PSRP INSR SZ6 10 KNEE (Insert) IMPLANT
BASE TIBIAL ROT PLAT SZ 5 KNEE (Knees) IMPLANT
BATTERY INSTRU NAVIGATION (MISCELLANEOUS) ×4 IMPLANT
BLADE SAW 70X12.5 (BLADE) ×1 IMPLANT
BLADE SAW 90X13X1.19 OSCILLAT (BLADE) ×1 IMPLANT
BLADE SAW 90X25X1.19 OSCILLAT (BLADE) ×1 IMPLANT
BONE CEMENT GENTAMICIN (Cement) ×2 IMPLANT
BSPLAT TIB 5 CMNT ROT PLAT STR (Knees) ×1 IMPLANT
BTRY SRG DRVR LF (MISCELLANEOUS) ×4
CEMENT BONE GENTAMICIN 40 (Cement) IMPLANT
COOLER POLAR GLACIER W/PUMP (MISCELLANEOUS) ×1 IMPLANT
CUFF TOURN SGL QUICK 24 (TOURNIQUET CUFF)
CUFF TOURN SGL QUICK 34 (TOURNIQUET CUFF)
CUFF TRNQT CYL 24X4X16.5-23 (TOURNIQUET CUFF) IMPLANT
CUFF TRNQT CYL 34X4.125X (TOURNIQUET CUFF) IMPLANT
DRAPE 3/4 80X56 (DRAPES) ×1 IMPLANT
DRAPE INCISE IOBAN 66X45 STRL (DRAPES) IMPLANT
DRSG DERMACEA 8X12 NADH (GAUZE/BANDAGES/DRESSINGS) IMPLANT
DRSG MEPILEX SACRM 8.7X9.8 (GAUZE/BANDAGES/DRESSINGS) ×1 IMPLANT
DRSG NON-ADHERENT DERMACEA 3X4 (GAUZE/BANDAGES/DRESSINGS) ×1 IMPLANT
DRSG OPSITE POSTOP 4X14 (GAUZE/BANDAGES/DRESSINGS) ×1 IMPLANT
DRSG TEGADERM 4X4.75 (GAUZE/BANDAGES/DRESSINGS) ×1 IMPLANT
DURAPREP 26ML APPLICATOR (WOUND CARE) ×2 IMPLANT
ELECT CAUTERY BLADE 6.4 (BLADE) ×1 IMPLANT
ELECT REM PT RETURN 9FT ADLT (ELECTROSURGICAL) ×1
ELECTRODE REM PT RTRN 9FT ADLT (ELECTROSURGICAL) ×1 IMPLANT
EX-PIN ORTHOLOCK NAV 4X150 (PIN) ×2 IMPLANT
GLOVE BIOGEL M STRL SZ7.5 (GLOVE) ×2 IMPLANT
GLOVE SRG 8 PF TXTR STRL LF DI (GLOVE) ×1 IMPLANT
GLOVE SURG UNDER POLY LF SZ8 (GLOVE) ×1
GOWN STRL REUS W/ TWL LRG LVL3 (GOWN DISPOSABLE) ×1 IMPLANT
GOWN STRL REUS W/TWL LRG LVL3 (GOWN DISPOSABLE) ×1
GOWN TOGA ZIPPER T7+ PEEL AWAY (MISCELLANEOUS) ×2 IMPLANT
HEMOVAC 400CC 10FR (MISCELLANEOUS) ×1 IMPLANT
HOLDER FOLEY CATH W/STRAP (MISCELLANEOUS) ×1 IMPLANT
IV NS IRRIG 3000ML ARTHROMATIC (IV SOLUTION) ×1 IMPLANT
KIT TURNOVER KIT A (KITS) ×1 IMPLANT
KNIFE SCULPS 14X20 (INSTRUMENTS) ×1 IMPLANT
MANIFOLD NEPTUNE II (INSTRUMENTS) ×2 IMPLANT
NDL SPNL 20GX3.5 QUINCKE YW (NEEDLE) ×2 IMPLANT
NEEDLE SPNL 20GX3.5 QUINCKE YW (NEEDLE) ×2 IMPLANT
NS IRRIG 500ML POUR BTL (IV SOLUTION) ×1 IMPLANT
PACK TOTAL KNEE (MISCELLANEOUS) ×1 IMPLANT
PAD ABD DERMACEA PRESS 5X9 (GAUZE/BANDAGES/DRESSINGS) ×2 IMPLANT
PAD WRAPON POLAR KNEE (MISCELLANEOUS) ×1 IMPLANT
PATELLA MEDIAL ATTUN 35MM KNEE (Knees) IMPLANT
PIN DRILL FIX HALF THREAD (BIT) ×2 IMPLANT
PIN DRILL QUICK PACK ×2 IMPLANT
PIN FIXATION 1/8DIA X 3INL (PIN) ×1 IMPLANT
PULSAVAC PLUS IRRIG FAN TIP (DISPOSABLE) ×1
SOL PREP PVP 2OZ (MISCELLANEOUS) ×1
SOLUTION IRRIG SURGIPHOR (IV SOLUTION) ×1 IMPLANT
SOLUTION PREP PVP 2OZ (MISCELLANEOUS) ×1 IMPLANT
SPONGE DRAIN TRACH 4X4 STRL 2S (GAUZE/BANDAGES/DRESSINGS) ×1 IMPLANT
STAPLER SKIN PROX 35W (STAPLE) ×1 IMPLANT
STOCKINETTE IMPERV 14X48 (MISCELLANEOUS) ×1 IMPLANT
STRAP TIBIA SHORT (MISCELLANEOUS) ×1 IMPLANT
SUCTION FRAZIER HANDLE 10FR (MISCELLANEOUS) ×1
SUCTION TUBE FRAZIER 10FR DISP (MISCELLANEOUS) ×1 IMPLANT
SUT VIC AB 0 CT1 36 (SUTURE) ×1 IMPLANT
SUT VIC AB 1 CT1 36 (SUTURE) ×2 IMPLANT
SUT VIC AB 2-0 CT2 27 (SUTURE) ×1 IMPLANT
SYR 30ML LL (SYRINGE) ×2 IMPLANT
TIBIAL BASE ROT PLAT SZ 5 KNEE (Knees) ×1 IMPLANT
TIP FAN IRRIG PULSAVAC PLUS (DISPOSABLE) ×1 IMPLANT
TOWEL OR 17X26 4PK STRL BLUE (TOWEL DISPOSABLE) IMPLANT
TOWER CARTRIDGE SMART MIX (DISPOSABLE) ×1 IMPLANT
TRAP FLUID SMOKE EVACUATOR (MISCELLANEOUS) ×1 IMPLANT
TRAY FOLEY MTR SLVR 16FR STAT (SET/KITS/TRAYS/PACK) ×1 IMPLANT
WATER STERILE IRR 1000ML POUR (IV SOLUTION) ×1 IMPLANT
WRAPON POLAR PAD KNEE (MISCELLANEOUS) ×1

## 2022-11-15 NOTE — Anesthesia Preprocedure Evaluation (Signed)
Anesthesia Evaluation  Patient identified by MRN, date of birth, ID band Patient awake    Reviewed: Allergy & Precautions, H&P , NPO status , Patient's Chart, lab work & pertinent test results, reviewed documented beta blocker date and time   History of Anesthesia Complications (+) PONV and history of anesthetic complications  Airway Mallampati: II   Neck ROM: full    Dental  (+) Poor Dentition   Pulmonary sleep apnea    Pulmonary exam normal        Cardiovascular Exercise Tolerance: Poor hypertension, Normal cardiovascular exam+ Valvular Problems/Murmurs  Rhythm:regular Rate:Normal     Neuro/Psych  Headaches  Anxiety     TIACVA  negative psych ROS   GI/Hepatic Neg liver ROS,GERD  Medicated,,  Endo/Other  negative endocrine ROS    Renal/GU negative Renal ROS  negative genitourinary   Musculoskeletal   Abdominal   Peds  Hematology negative hematology ROS (+)   Anesthesia Other Findings Past Medical History: No date: Anxiety No date: Arthritis No date: Cataract cortical, senile No date: Cellulitis due to MRSA     Comment:  right leg (hospitalized [facility]) No date: Chronic pain in right foot 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 No date: Diverticulosis No date: Fibrocystic breast No date: Fibromuscular dysplasia (HCC) No date: GERD (gastroesophageal reflux disease) No date: Heart murmur     Comment:  mild 1998: History of blood transfusion     Comment:  slight rash with No date: History of hypertension     Comment:  off all meds for last 4 years No date: History of methicillin resistant staphylococcus aureus (MRSA) No date: History of stomach ulcers No date: Hypertension No date: Lichen sclerosus of female genitalia No date: Migraines No date: Mild cognitive impairment No date: PONV (postoperative nausea and vomiting)     Comment:  just once -takes alot to sedate patient 2022: Stroke [facility]) No date: Thyroid  nodule 2022: TIA (transient ischemic attack)     Comment:  Dr Manuella Ghazi Past Surgical History: 1998: ABDOMINAL HYSTERECTOMY 1967: benign breast tumor removed; Left 1967 for left, 2010 for right: BREAST EXCISIONAL BIOPSY; Bilateral     Comment:  benign 8 yrs ago: cataract surgery     Comment:  both eyes 2015: COLON RESECTION     Comment:  anterior sigmoid No date: COLON SURGERY     Comment:  8"colon removed-fistula 05/30/2015: COLONOSCOPY WITH PROPOFOL; N/A     Comment:  Procedure: COLONOSCOPY WITH PROPOFOL;  Surgeon: Hulen Luster, MD;  Location: [facility] ENDOSCOPY;  Service:               Gastroenterology;  Laterality: N/A; 02/22/2013: EUS; N/A     Comment:  Procedure: UPPER ENDOSCOPIC ULTRASOUND (EUS) LINEAR;                Surgeon: Milus Banister, MD;  Location: [facility] ENDOSCOPY;                Service: Endoscopy;  Laterality: N/A; No date: excision cyst index finger; Left     Comment:  extensor tendon and extensor retinacular release No date: EYE SURGERY; Bilateral     Comment:  cataract 11/26/2019: KNEE ARTHROPLASTY; Left     Comment:  Procedure: COMPUTER ASSISTED TOTAL KNEE ARTHROPLASTY;                Surgeon: Dereck Leep, MD;  Location: [facility] ORS;  Service: Orthopedics;  Laterality: Left; 09/21/2021: POSTERIOR CERVICAL LAMINECTOMY; N/A     Comment:  Procedure: C4-6 LAMINECTOMY;  Surgeon: Deetta Perla, MD;              Location: [facility] ORS;  Service: Neurosurgery;  Laterality:               N/A; 2009: right benign breast lump removed No date: tummy tuck No date: WRIST GANGLION EXCISION; Left   Reproductive/Obstetrics negative OB ROS                             Anesthesia Physical Anesthesia Plan  ASA: 3  Anesthesia Plan: Spinal   Post-op Pain Management:    Induction:   PONV Risk Score and Plan: 4 or greater  Airway Management Planned:   Additional Equipment:   Intra-op Plan:   Post-operative Plan:   Informed  Consent: I have reviewed the patients History and Physical, chart, labs and discussed the procedure including the risks, benefits and alternatives for the proposed anesthesia with the patient or authorized representative who has indicated his/her understanding and acceptance.     Dental Advisory Given  Plan Discussed with: CRNA  Anesthesia Plan Comments:        Anesthesia Quick Evaluation

## 2022-11-15 NOTE — Progress Notes (Signed)
Patient awake/alert x4.  Moving bil lower ext, sensation intact. Pulses +2 warm/dry. Tolerating po fluids. C/o's "headache" > right knee discomfort, medicated as ordered.  Will continue to monitor.

## 2022-11-15 NOTE — Anesthesia Procedure Notes (Signed)
Spinal  Patient location during procedure: OR Start time: 11/15/2022 12:10 PM End time: 11/15/2022 12:17 PM Reason for block: surgical anesthesia Staffing Performed: anesthesiologist and resident/CRNA  Anesthesiologist: Molli Barrows, MD Resident/CRNA: Rona Ravens, CRNA Performed by: Rona Ravens, CRNA Authorized by: Molli Barrows, MD   Preanesthetic Checklist Completed: patient identified, IV checked, site marked, risks and benefits discussed, surgical consent, monitors and equipment checked, pre-op evaluation and timeout performed Spinal Block Patient position: sitting Prep: ChloraPrep Patient monitoring: heart rate, continuous pulse ox, blood pressure and cardiac monitor Approach: midline Location: L4-5 Injection technique: single-shot Needle Needle type: [place]acre and Introducer  Needle gauge: 24 G Needle length: 9 cm Assessment Events: CSF return Additional Notes Negative paresthesia. Negative blood return. Positive free-flowing CSF. Expiration date of kit checked and confirmed. Patient tolerated procedure well, without complications.

## 2022-11-15 NOTE — Progress Notes (Signed)
PT Cancellation Note  Patient Details Name: Carrie [name] MRN: [id number] DOB: 08-Sep-1946   Cancelled Treatment:    Reason Eval/Treat Not Completed: Patient not medically ready.  PT consult received.  Chart reviewed.  Pt s/p R TKA with spinal anesthesia.  Per discussion with pt's nurse (in PACU), pt does not have adequate LE strength return to participate in physical therapy at this time.  Will re-attempt to see pt tomorrow morning.  Leitha Bleak, PT 11/15/22, 4:23 PM

## 2022-11-15 NOTE — Interval H&P Note (Signed)
History and Physical Interval Note:  11/15/2022 11:16 AM  Carrie [name]  has presented today for surgery, with the diagnosis of PRIMARY OSTEOARTHRITIS OF RIGHT KNEE.  The various methods of treatment have been discussed with the patient and family. After consideration of risks, benefits and other options for treatment, the patient has consented to  Procedure(s): COMPUTER ASSISTED TOTAL KNEE ARTHROPLASTY (Right) as a surgical intervention.  The patient's history has been reviewed, patient examined, no change in status, stable for surgery.  I have reviewed the patient's chart and labs.  Questions were answered to the patient's satisfaction.     [place]

## 2022-11-15 NOTE — Transfer of Care (Signed)
Immediate Anesthesia Transfer of Care Note  Patient: Carrie [name]  Procedure(s) Performed: COMPUTER ASSISTED TOTAL KNEE ARTHROPLASTY (Right: Knee)  Patient Location: PACU  Anesthesia Type:MAC and Spinal  Level of Consciousness: awake, alert , and oriented  Airway & Oxygen Therapy: Patient Spontanous Breathing  Post-op Assessment: Report given to RN and Post -op Vital signs reviewed and stable  Post vital signs: Reviewed and stable  Last Vitals:  Vitals Value Taken Time  BP 112/49 11/15/22 1531  Temp    Pulse 72 11/15/22 1534  Resp 14 11/15/22 1534  SpO2 95 % 11/15/22 1534  Vitals shown include unvalidated device data.  Last Pain:  Vitals:   11/15/22 1034  TempSrc: Temporal  PainSc: 0-No pain         Complications: No notable events documented.

## 2022-11-15 NOTE — Op Note (Signed)
OPERATIVE NOTE  DATE OF SURGERY:  11/15/2022  PATIENT NAME:  Carrie [name]   DOB: 02-May-1946  MRN: [id number]  PRE-OPERATIVE DIAGNOSIS: Degenerative arthrosis of the right knee, primary  POST-OPERATIVE DIAGNOSIS:  Same  PROCEDURE:  Right total knee arthroplasty using computer-assisted navigation  SURGEON:  Marciano Sequin. M.D.  ANESTHESIA: spinal  ESTIMATED BLOOD LOSS: 50 mL  FLUIDS REPLACED: 1000 mL of crystalloid  TOURNIQUET TIME: 84 minutes  DRAINS: 2 medium Hemovac drains  SOFT TISSUE RELEASES: Anterior cruciate ligament, posterior cruciate ligament, deep medial collateral ligament, patellofemoral ligament  IMPLANTS UTILIZED: DePuy Attune size 6N posterior stabilized femoral component (cemented), size 5 rotating platform tibial component (cemented), 35 mm medialized dome patella (cemented), and a 10 mm stabilized rotating platform polyethylene insert.  INDICATIONS FOR SURGERY: Carrie [name] is a 77 y.o. year old female with a long history of progressive knee pain. X-rays demonstrated severe degenerative changes in tricompartmental fashion. The patient had not seen any significant improvement despite conservative nonsurgical intervention. After discussion of the risks and benefits of surgical intervention, the patient expressed understanding of the risks benefits and agree with plans for total knee arthroplasty.   The risks, benefits, and alternatives were discussed at length including but not limited to the risks of infection, bleeding, nerve injury, stiffness, blood clots, the need for revision surgery, cardiopulmonary complications, among others, and they were willing to proceed.  PROCEDURE IN DETAIL: The patient was brought into the operating room and, after adequate spinal anesthesia was achieved, a tourniquet was placed on the patient's upper thigh. The patient's knee and leg were cleaned and prepped with alcohol and DuraPrep and draped in the usual sterile fashion. A  "timeout" was performed as per usual protocol. The lower extremity was exsanguinated using an Esmarch, and the tourniquet was inflated to 300 mmHg. An anterior longitudinal incision was made followed by a standard mid vastus approach. The deep fibers of the medial collateral ligament were elevated in a subperiosteal fashion off of the medial flare of the tibia so as to maintain a continuous soft tissue sleeve. The patella was subluxed laterally and the patellofemoral ligament was incised. Inspection of the knee demonstrated severe degenerative changes with full-thickness loss of articular cartilage. Osteophytes were debrided using a rongeur. Anterior and posterior cruciate ligaments were excised. Two 4.0 mm Schanz pins were inserted in the femur and into the tibia for attachment of the array of trackers used for computer-assisted navigation. Hip center was identified using a circumduction technique. Distal landmarks were mapped using the computer. The distal femur and proximal tibia were mapped using the computer. The distal femoral cutting guide was positioned using computer-assisted navigation so as to achieve a 5 distal valgus cut. The femur was sized and it was felt that a size 6N femoral component was appropriate. A size 6 femoral cutting guide was positioned and the anterior cut was performed and verified using the computer. This was followed by completion of the posterior and chamfer cuts. Femoral cutting guide for the central box was then positioned in the center box cut was performed.  Attention was then directed to the proximal tibia. Medial and lateral menisci were excised. The extramedullary tibial cutting guide was positioned using computer-assisted navigation so as to achieve a 0 varus-valgus alignment and 3 posterior slope. The cut was performed and verified using the computer. The proximal tibia was sized and it was felt that a size 5 tibial tray was appropriate. Tibial and femoral trials were  inserted followed  by insertion of a 10 mm polyethylene insert. This allowed for excellent mediolateral soft tissue balancing both in flexion and in full extension. Finally, the patella was cut and prepared so as to accommodate a 35 mm medialized dome patella. A patella trial was placed and the knee was placed through a range of motion with excellent patellar tracking appreciated. The femoral trial was removed after debridement of posterior osteophytes. The central post-hole for the tibial component was reamed followed by insertion of a keel punch. Tibial trials were then removed. Cut surfaces of bone were irrigated with copious amounts of normal saline using pulsatile lavage and then suctioned dry. Polymethylmethacrylate cement with gentamicin was prepared in the usual fashion using a vacuum mixer. Cement was applied to the cut surface of the proximal tibia as well as along the undersurface of a size 5 rotating platform tibial component. Tibial component was positioned and impacted into place. Excess cement was removed using [employer] fast streamer. Cement was then applied to the cut surfaces of the femur as well as along the posterior flanges of the size 6N femoral component. The femoral component was positioned and impacted into place. Excess cement was removed using [employer] fast streamer. A 10 mm polyethylene trial was inserted and the knee was brought into full extension with steady axial compression applied. Finally, cement was applied to the backside of a 35 mm medialized dome patella and the patellar component was positioned and patellar clamp applied. Excess cement was removed using [employer] fast streamer. After adequate curing of the cement, the tourniquet was deflated after a total tourniquet time of 84 minutes. Hemostasis was achieved using electrocautery. The knee was irrigated with copious amounts of normal saline using pulsatile lavage followed by 450 ml of Surgiphor and then suctioned dry. 20 mL of 1.3% Exparel and 60 mL  of 0.25% [place] in 40 mL of normal saline was injected along the posterior capsule, medial and lateral gutters, and along the arthrotomy site. A 10 mm stabilized rotating platform polyethylene insert was inserted and the knee was placed through a range of motion with excellent mediolateral soft tissue balancing appreciated and excellent patellar tracking noted. 2 medium drains were placed in the wound bed and brought out through separate stab incisions. The medial parapatellar portion of the incision was reapproximated using interrupted sutures of #1 Vicryl. Subcutaneous tissue was approximated in layers using first #0 Vicryl followed #2-0 Vicryl. The skin was approximated with skin staples. A sterile dressing was applied.  The patient tolerated the procedure well and was transported to the recovery room in stable condition.    Thermon Zulauf P. Holley Bouche., M.D.

## 2022-11-15 NOTE — Progress Notes (Signed)
Orthopaedics: Patient is not able to walk the distance required to go the bathroom, or he/she is unable to safely negotiate stairs required to access the bathroom.  A 3in1 BSC will alleviate this problem    Willard Madrigal P. Tashanda Fuhrer, Jr. M.D.  

## 2022-11-16 ENCOUNTER — Encounter: Payer: Self-pay | Admitting: Orthopedic Surgery

## 2022-11-16 DIAGNOSIS — M1711 Unilateral primary osteoarthritis, right knee: Secondary | ICD-10-CM | POA: Diagnosis not present

## 2022-11-16 MED ORDER — METOCLOPRAMIDE HCL 10 MG PO TABS
ORAL_TABLET | ORAL | Status: AC
  Administered 2022-11-16: 10 mg via ORAL
  Filled 2022-11-16: qty 1

## 2022-11-16 MED ORDER — SENNOSIDES-DOCUSATE SODIUM 8.6-50 MG PO TABS
ORAL_TABLET | ORAL | Status: AC
  Administered 2022-11-16: 1 via ORAL
  Filled 2022-11-16: qty 1

## 2022-11-16 MED ORDER — ENOXAPARIN SODIUM 30 MG/0.3ML IJ SOSY
PREFILLED_SYRINGE | INTRAMUSCULAR | Status: AC
  Filled 2022-11-16: qty 0.3

## 2022-11-16 MED ORDER — LOSARTAN POTASSIUM 50 MG PO TABS
ORAL_TABLET | ORAL | Status: AC
  Filled 2022-11-16: qty 1

## 2022-11-16 MED ORDER — ENOXAPARIN SODIUM 40 MG/0.4ML IJ SOSY: 40.0000 mg | PREFILLED_SYRINGE | INTRAMUSCULAR | 0 refills | Status: DC

## 2022-11-16 MED ORDER — MAGNESIUM HYDROXIDE 400 MG/5ML PO SUSP
ORAL | Status: AC
  Administered 2022-11-16: 30 mL via ORAL
  Filled 2022-11-16: qty 30

## 2022-11-16 MED ORDER — OXYCODONE HCL 5 MG PO TABS
ORAL_TABLET | ORAL | Status: AC
  Administered 2022-11-16: 5 mg via ORAL
  Filled 2022-11-16: qty 1

## 2022-11-16 MED ORDER — ACETAMINOPHEN 10 MG/ML IV SOLN
INTRAVENOUS | Status: AC
  Administered 2022-11-16: 1000 mg via INTRAVENOUS
  Filled 2022-11-16: qty 100

## 2022-11-16 MED ORDER — OXYCODONE HCL 5 MG PO TABS
ORAL_TABLET | ORAL | Status: AC
  Filled 2022-11-16: qty 1

## 2022-11-16 MED ORDER — PANTOPRAZOLE SODIUM 40 MG PO TBEC
DELAYED_RELEASE_TABLET | ORAL | Status: AC
  Administered 2022-11-16: 40 mg via ORAL
  Filled 2022-11-16: qty 1

## 2022-11-16 MED ORDER — CELECOXIB 200 MG PO CAPS
ORAL_CAPSULE | ORAL | Status: AC
  Administered 2022-11-16: 200 mg via ORAL
  Filled 2022-11-16: qty 1

## 2022-11-16 MED ORDER — TRAMADOL HCL 50 MG PO TABS: 50.0000 mg | ORAL_TABLET | ORAL | 0 refills | Status: DC | PRN

## 2022-11-16 MED ORDER — GABAPENTIN 100 MG PO CAPS
ORAL_CAPSULE | ORAL | Status: AC
  Administered 2022-11-16: 200 mg via ORAL
  Filled 2022-11-16: qty 2

## 2022-11-16 MED ORDER — OXYCODONE HCL 5 MG PO TABS: 5.0000 mg | ORAL_TABLET | ORAL | 0 refills | Status: DC | PRN

## 2022-11-16 MED ORDER — FERROUS SULFATE 325 (65 FE) MG PO TABS
ORAL_TABLET | ORAL | Status: AC
  Filled 2022-11-16: qty 1

## 2022-11-16 MED ORDER — CELECOXIB 200 MG PO CAPS: 200.0000 mg | ORAL_CAPSULE | Freq: Two times a day (BID) | ORAL | 1 refills | Status: DC

## 2022-11-16 NOTE — TOC Progression Note (Signed)
Transition of Care [facility]) - Progression Note    Patient Details  Name: Carrie [name] MRN: [id number] Date of Birth: July 11, 1946  Transition of [facility]) CM/SW [place], RN Phone Number: 11/16/2022, 10:08 AM  Clinical Narrative:    The patient is set up with [place] for [facility] services, 3 in 1 to be delivered by [employer] prior to DC   Expected Discharge Plan: [place] Barriers to Discharge: No Barriers Identified  Expected Discharge Plan and Services   Discharge Planning Services: CM Consult   Living arrangements for the past 2 months: Single Family Home Expected Discharge Date: 11/16/22               DME Arranged: 3-N-1 DME Agency: [employer]       HH Arranged: PT, OT HH Agency: [place] Date HH Agency Contacted: 11/16/22 Time [place]: [phone] Representative spoke with at [place]: [place]   Social Determinants of Health ([place]) Interventions SDOH Screenings   Food Insecurity: No Food Insecurity (11/15/2022)  Housing: Low Risk  (11/15/2022)  Transportation Needs: No Transportation Needs (11/15/2022)  Utilities: At Risk (11/15/2022)  Tobacco Use: Low Risk  (11/16/2022)    Readmission Risk Interventions     No data to display

## 2022-11-16 NOTE — Evaluation (Signed)
Physical Therapy Evaluation Patient Details Name: Carrie [name] MRN: [id number] DOB: 08-19-1946 Today's Date: 11/16/2022  History of Present Illness  Pt is a 77 y.o. 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  PMH includes sleep apnea, htn, anxiety, CVA, R LE cellulitis, chronic R foot pain, fibromuscular dysplasia, colon sx, L TKA 2021, and posterior cervical laminectomy 09/21/2021.  Clinical Impression  Prior to surgery, pt was independent with functional mobility (occasional use of SPC); lives with her daughter (who is retired) in 1 level home with ramp to enter; pt's daughter able to assist pt as needed upon hospital discharge.  Pain R knee 4/10 at rest beginning of session but 3/10 with mobility and at rest end of session.  Able to perform R LE SLR independently (so no KI utilized) and R knee flexion AROM to 120 degrees.  Reviewed LE HEP in supine and sitting and pt appearing with good understanding.  Currently pt is modified independent with bed mobility; SBA with transfers; CGA progressing to SBA ambulating 150 feet x2 with youth sized RW; and CGA navigating 4 steps with B railings.  Reviewed home safety and safe car transfer technique: pt and pt's daughter verbalizing appropriate understanding.  Pt would benefit from skilled PT to address noted impairments and functional limitations (see below for any additional details).  Upon hospital discharge, pt would benefit from [place].  Pt appears appropriate for home discharge today from PT standpoint (MD, PA, nurse, and TOC notified of this and pt needing 3 in 1 [facility] for discharge today).  Pt and pt's daughter report no questions/concerns for home discharge today.    Recommendations for follow up therapy are one component of a multi-disciplinary discharge planning process, led by the attending physician.  Recommendations may be updated based on patient status, additional functional criteria and insurance authorization.  Follow Up  Recommendations Home health PT      Assistance Recommended at Discharge Set up Supervision/Assistance  Patient can return home with the following  A little help with walking and/or transfers;A little help with bathing/dressing/bathroom;Assistance with cooking/housework;Assist for transportation;Help with stairs or ramp for entrance    Equipment Recommendations BSC/3in1 (pt has own RW at home already)  Recommendations for Other Services  OT consult    Functional Status Assessment Patient has had a recent decline in their functional status and demonstrates the ability to make significant improvements in function in a reasonable and predictable amount of time.     Precautions / Restrictions Precautions Precautions: Fall;Knee Precaution Booklet Issued: Yes (comment) Restrictions Weight Bearing Restrictions: Yes RLE Weight Bearing: Weight bearing as tolerated      Mobility  Bed Mobility Overal bed mobility: Modified Independent             General bed mobility comments: supine to/from sitting (mild increased effort to perform on own)    Transfers Overall transfer level: Needs assistance Equipment used: Rolling walker (2 wheels) Transfers: Sit to/from Stand Sit to Stand: Supervision           General transfer comment: x2 trials from bed; initial vc's for UE/LE placement; steady    Ambulation/Gait Ambulation/Gait assistance: Min guard, Supervision Gait Distance (Feet):  (150 feet x2) Assistive device: Rolling walker (2 wheels) (youth sized)         General Gait Details: pt initially with step through gait pattern but c/o pain behind R knee but with vc's for shorter B LE step length pt reporting improved R knee comfort with this adjustment  Stairs Stairs: Yes Stairs assistance: Min guard Stair Management: Two rails, Step to pattern, Forwards Number of Stairs: 4 General stair comments: inital vc's for LE sequencing; steady  Wheelchair Mobility    Modified  [place] (Stroke Patients Only)       Balance Overall balance assessment: Needs assistance Sitting-balance support: No upper extremity supported, Feet supported Sitting balance-Leahy Scale: Normal Sitting balance - Comments: steady sitting reaching outside BOS   Standing balance support: Bilateral upper extremity supported, During functional activity, Reliant on assistive device for balance Standing balance-Leahy Scale: Good Standing balance comment: steady ambulating with walker                             Pertinent Vitals/Pain Pain Assessment Pain Assessment: 0-10 Pain Score: 3  Pain Location: R knee Pain Descriptors / Indicators: Aching, Tender, Sore Pain Intervention(s): Limited activity within patient's tolerance, Monitored during session, Repositioned, Premedicated before session, Other (comment) (polar care applied) Vitals (HR and O2 on room air) stable and WFL throughout treatment session.    Home Living Family/patient expects to be discharged to:: Private residence Living Arrangements: Children (pt's daughter) Available Help at Discharge: Family;Available 24 hours/day Type of Home: House Home Access: Ramped entrance       Home Layout: One level Home Equipment: Grab bars - tub/shower;Shower seat - built in;Grab bars - Academic librarian (2 wheels);Rollator (4 wheels);Cane - single point;Wheelchair - manual      Prior Function Prior Level of Function : Independent/Modified Independent             Mobility Comments: Occasional use of SPC; no recent falls.       Hand Dominance        Extremity/Trunk Assessment   Upper Extremity Assessment Upper Extremity Assessment: Overall WFL for tasks assessed    Lower Extremity Assessment Lower Extremity Assessment: RLE deficits/detail (L LE WFL) RLE Deficits / Details: able to perform R LE SLR independently; at least 3/5 AROM hip flexion and ankle DF/PF RLE: Unable to fully assess due to pain     Cervical / Trunk Assessment Cervical / Trunk Assessment: Normal  Communication   Communication: No difficulties  Cognition Arousal/Alertness: Awake/alert Behavior During Therapy: WFL for tasks assessed/performed Overall Cognitive Status: Within Functional Limits for tasks assessed                                          General Comments  Nursing cleared pt for participation in physical therapy.  Pt agreeable to PT session.  Pt's daughter present during session.    Exercises Total Joint Exercises Ankle Circles/Pumps: AROM, Strengthening, Both, 10 reps, Supine Quad Sets: AROM, Strengthening, Both, 10 reps, Supine Short Arc Quad: AROM, Strengthening, Right, 10 reps, Supine Heel Slides: AROM, Strengthening, Right, 10 reps, Supine Hip ABduction/ADduction: AROM, Strengthening, Right, 10 reps, Supine Straight Leg Raises: AROM, Strengthening, Right, 10 reps, Supine Long Arc Quad: AROM, Strengthening, Right, 10 reps, Seated Knee Flexion: AROM, Strengthening, Right, 10 reps, Seated Goniometric ROM: R knee AROM 5-120 degrees   Assessment/Plan    PT Assessment Patient needs continued PT services  PT Problem List Decreased strength;Decreased range of motion;Decreased activity tolerance;Decreased balance;Decreased mobility;Decreased knowledge of use of DME;Decreased knowledge of precautions;Decreased skin integrity;Pain       PT Treatment Interventions DME instruction;Gait training;Stair training;Functional mobility training;Therapeutic activities;Therapeutic exercise;Balance training;Patient/family education  PT Goals (Current goals can be found in the Care Plan section)  Acute Rehab PT Goals Patient Stated Goal: to go home today PT Goal Formulation: With patient/family Time For Goal Achievement: 11/30/22 Potential to Achieve Goals: Good    Frequency BID     Co-evaluation               AM-PAC PT "6 Clicks" Mobility  Outcome Measure Help needed turning  from your back to your side while in a flat bed without using bedrails?: None Help needed moving from lying on your back to sitting on the side of a flat bed without using bedrails?: None Help needed moving to and from a bed to a chair (including a wheelchair)?: A Little Help needed standing up from a chair using your arms (e.g., wheelchair or bedside chair)?: A Little Help needed to walk in hospital room?: A Little Help needed climbing 3-5 steps with a railing? : A Little 6 Click Score: 20    End of Session Equipment Utilized During Treatment: Gait belt Activity Tolerance: Patient tolerated treatment well Patient left: in bed;with call bell/phone within reach;with bed alarm set;with family/visitor present;with SCD's reapplied;Other (comment) (B heels floating via towel roll; polar care in place) Nurse Communication: Mobility status;Precautions;Weight bearing status;Other (comment) (pt good to go home today from PT standpoint) PT Visit Diagnosis: Other abnormalities of gait and mobility (R26.89);Muscle weakness (generalized) (M62.81);Pain Pain - Right/Left: Right Pain - part of body: Knee    Time: 5638-7564 PT Time Calculation (min) (ACUTE ONLY): 37 min   Charges:   PT Evaluation $PT Eval Low Complexity: 1 Low PT Treatments $Gait Training: 8-22 mins $Therapeutic Exercise: 8-22 mins       Hendricks Limes, PT 11/16/22, 10:04 AM

## 2022-11-16 NOTE — Anesthesia Postprocedure Evaluation (Signed)
Anesthesia Post Note  Patient: Carrie [name]  Procedure(s) Performed: COMPUTER ASSISTED TOTAL KNEE ARTHROPLASTY (Right: Knee)  Patient location during evaluation: PACU Anesthesia Type: MAC Level of consciousness: awake and alert Pain management: pain level controlled Vital Signs Assessment: post-procedure vital signs reviewed and stable Respiratory status: spontaneous breathing, nonlabored ventilation, respiratory function stable and patient connected to nasal cannula oxygen Cardiovascular status: blood pressure returned to baseline and stable Postop Assessment: no apparent nausea or vomiting Anesthetic complications: no   No notable events documented.   Last Vitals:  Vitals:   11/15/22 2029 11/16/22 0000  BP: (!) 125/57 (!) 124/56  Pulse: 60 62  Resp: 16 14  Temp: 36.5 C 36.6 C  SpO2: 98% 97%    Last Pain:  Vitals:   11/16/22 0351  TempSrc:   PainSc: [facility] Lucyann Romano

## 2022-11-16 NOTE — Progress Notes (Deleted)
Patient is not able to walk the distance required to go the bathroom, or he/she is unable to safely negotiate stairs required to access the bathroom.  A 3in1 BSC will alleviate this problem  

## 2022-11-16 NOTE — Discharge Summary (Signed)
Physician Discharge Summary  Subjective: 1 Day Post-Op Procedure(s) (LRB): COMPUTER ASSISTED TOTAL KNEE ARTHROPLASTY (Right) Patient reports pain as mild.   Patient seen in rounds with Dr. Ernest Pine. Patient is well, and has had no acute complaints or problems Patient is ready to go home with home health physical therapy.  Physician Discharge Summary  Patient ID: Carrie [name] MRN: [id number] DOB/AGE: 04-Apr-1946 77 y.o.  Admit date: 11/15/2022 Discharge date: 11/16/2022  Admission Diagnoses:  Discharge Diagnoses:  Principal Problem:   Total knee replacement status   Discharged Condition: fair  Hospital Course: The patient is postop day 1 from a right total knee arthroplasty.  She is doing well since surgery.  Her vitals have remained stable.  Her pain is manageable.  Her Hemovac was removed.  She is ready to do physical therapy this morning before discharging home.  Treatments: surgery:   Right total knee arthroplasty using computer-assisted navigation   SURGEON:  Jena Gauss. M.D.   ANESTHESIA: spinal   ESTIMATED BLOOD LOSS: 50 mL   FLUIDS REPLACED: 1000 mL of crystalloid   TOURNIQUET TIME: 84 minutes   DRAINS: 2 medium Hemovac drains   SOFT TISSUE RELEASES: Anterior cruciate ligament, posterior cruciate ligament, deep medial collateral ligament, patellofemoral ligament   IMPLANTS UTILIZED: DePuy Attune size 6N posterior stabilized femoral component (cemented), size 5 rotating platform tibial component (cemented), 35 mm medialized dome patella (cemented), and a 10 mm stabilized rotating platform polyethylene insert.  Discharge Exam: Blood pressure (!) 124/56, pulse 62, temperature 97.9 F (36.6 C), temperature source Oral, resp. rate 14, height 5' (1.524 m), weight 71 kg, SpO2 97 %.   Disposition: Discharge disposition: 01-Home or Self Care        Allergies as of 11/16/2022       Reactions   Pneumococcal Polysaccharide Vaccine Rash   Severe local  reaction         Medication List     TAKE these medications    amoxicillin 500 MG tablet Commonly known as: AMOXIL Take 500 mg by mouth as needed (for dental work).   aspirin EC 81 MG tablet Take 1 tablet by mouth daily.   atorvastatin 40 MG tablet Commonly known as: LIPITOR Take 40 mg by mouth at bedtime.   celecoxib 200 MG capsule Commonly known as: CELEBREX Take 1 capsule (200 mg total) by mouth 2 (two) times daily.   donepezil 5 MG tablet Commonly known as: ARICEPT Take 5 mg by mouth at bedtime.   enoxaparin 40 MG/0.4ML injection Commonly known as: LOVENOX Inject 0.4 mLs (40 mg total) into the skin daily for 14 days.   gabapentin 100 MG capsule Commonly known as: NEURONTIN Take 200 mg by mouth 3 (three) times daily.   losartan 50 MG tablet Commonly known as: COZAAR Take 50 mg by mouth every morning.   magnesium oxide 400 MG tablet Commonly known as: MAG-OX Take 400 mg by mouth daily.   Melatonin 5 MG Caps Take 5 mg by mouth at bedtime as needed (sleep).   multivitamin with minerals tablet Take 1 tablet by mouth daily. 50 +   oxyCODONE 5 MG immediate release tablet Commonly known as: Oxy IR/ROXICODONE Take 1 tablet (5 mg total) by mouth every 4 (four) hours as needed for moderate pain (pain score 4-6).   polyethylene glycol 17 g packet Commonly known as: MIRALAX / GLYCOLAX Take 17 g by mouth daily.   senna 8.6 MG Tabs tablet Commonly known as: SENOKOT Take 1 tablet (8.6  mg total) by mouth 2 (two) times daily.   traMADol 50 MG tablet Commonly known as: ULTRAM Take 1-2 tablets (50-100 mg total) by mouth every 4 (four) hours as needed for moderate pain.   VITAMIN B-12 PO Take 2,500 mcg by mouth daily.   zolmitriptan 5 MG disintegrating tablet Commonly known as: ZOMIG-ZMT Take 5 mg by mouth as needed for migraine.   ZzzQuil 50 MG/30ML Liqd Generic drug: diphenhydrAMINE HCl (Sleep) Take 30 mLs by mouth at bedtime as needed (sleep).                Durable Medical Equipment  (From admission, onward)           Start     Ordered   11/15/22 1633  DME Walker rolling  Once       Question:  Patient needs a walker to treat with the following condition  Answer:  Total knee replacement status   11/15/22 1632   11/15/22 1633  DME Bedside commode  Once       Comments: Patient is not able to walk the distance required to go the bathroom, or he/she is unable to safely negotiate stairs required to access the bathroom.  A 3in1 BSC will alleviate this problem  Question:  Patient needs a bedside commode to treat with the following condition  Answer:  Total knee replacement status   11/15/22 1632            Follow-up Information     Watt Climes, PA Follow up on 11/29/2022.   Specialty: Physician Assistant Why: at 9:45am Contact information: [place] [address] [phone]         Dereck Leep, MD Follow up on 12/28/2022.   Specialty: Orthopedic Surgery Why: at 9:45am Contact information: [place] [address] [phone]                 Signed: Prescott Parma, Jewelz Ricklefs 11/16/2022, 7:23 AM   Objective: Vital signs in last 24 hours: Temp:  [97.5 F (36.4 C)-98.6 F (37 C)] 97.9 F (36.6 C) (01/16 0000) Pulse Rate:  [60-82] 62 (01/16 0000) Resp:  [13-17] 14 (01/16 0000) BP: (108-141)/(40-66) 124/56 (01/16 0000) SpO2:  [95 %-100 %] 97 % (01/16 0000) Weight:  [71 kg] 71 kg (01/15 1557)  Intake/Output from previous day:  Intake/Output Summary (Last 24 hours) at 11/16/2022 0723 Last data filed at 11/16/2022 0700 Gross per 24 hour  Intake 3708.88 ml  Output 1680 ml  Net 2028.88 ml    Intake/Output this shift: No intake/output data recorded.  Labs: No results for input(s): "HGB" in the last 72 hours. No results for input(s): "WBC", "RBC", "HCT", "PLT" in the last 72 hours. No results for input(s): "NA", "K", "CL", "CO2", "BUN",  "CREATININE", "GLUCOSE", "CALCIUM" in the last 72 hours. No results for input(s): "LABPT", "INR" in the last 72 hours.  EXAM: General - Patient is Alert and Oriented Extremity - Neurovascular intact Sensation intact distally Dorsiflexion/Plantar flexion intact Compartment soft Incision - clean, dry, with the Hemovac tubing removed with no complication.  The tubing was intact on removal. Motor Function -plantarflexion and dorsiflexion intact.  Able to straight leg raise independently.  Assessment/Plan: 1 Day Post-Op Procedure(s) (LRB): COMPUTER ASSISTED TOTAL KNEE ARTHROPLASTY (Right) Procedure(s) (LRB): COMPUTER ASSISTED TOTAL KNEE ARTHROPLASTY (Right) Past Medical History:  Diagnosis Date   Anxiety    Arthritis    Cataract cortical, senile    Cellulitis due to  MRSA    right leg (hospitalized [facility])   Chronic pain in right foot    Colovaginal fistula    Diverticulosis    Fibrocystic breast    Fibromuscular dysplasia (HCC)    GERD (gastroesophageal reflux disease)    Heart murmur    mild   History of blood transfusion 1998   slight rash with   History of hypertension    off all meds for last 4 years   History of methicillin resistant staphylococcus aureus (MRSA)    History of stomach ulcers    Hypertension    Lichen sclerosus of female genitalia    Migraines    Mild cognitive impairment    PONV (postoperative nausea and vomiting)    just once -takes alot to sedate patient   Stroke [facility]) 2022   Thyroid nodule    TIA (transient ischemic attack) 2022   Dr Manuella Ghazi   Principal Problem:   Total knee replacement status  Estimated body mass index is 30.57 kg/m as calculated from the following:   Height as of this encounter: 5' (1.524 m).   Weight as of this encounter: 71 kg. Advance diet Up with therapy D/C IV fluids Discharge home with home health Diet - Regular diet Follow up - in 2 weeks Activity - WBAT Disposition - Home Condition Upon Discharge - Stable DVT  Prophylaxis - Lovenox and support stockings  Reche Dixon, PA-C Orthopaedic Surgery 11/16/2022, 7:23 AM

## 2022-11-16 NOTE — Evaluation (Signed)
Occupational Therapy Evaluation Patient Details Name: Carrie [name] MRN: [id number] DOB: 04-Aug-1946 Today's Date: 11/16/2022   History of Present Illness Pt is a 77 y.o. 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  PMH includes sleep apnea, htn, anxiety, CVA, R LE cellulitis, chronic R foot pain, fibromuscular dysplasia, colon sx, L TKA 2021, and posterior cervical laminectomy 09/21/2021.   Clinical Impression     Upon entering the room, pt supine in bed with daughter present in room. OT educating pt and family on polar care and providing paper handout. Pt performing bed mobility without assistance. Pt seated on EOB with education on LB dressing techniques. Pt able to thread pants and underwear onto B LEs and pull over B hips with supervision in standing. Pt needing assistance to don B shoes. All education completed with pt and family having no further concerns at this time. Pt does not need skilled acute OT intervention. OT to complete order.    Recommendations for follow up therapy are one component of a multi-disciplinary discharge planning process, led by the attending physician.  Recommendations may be updated based on patient status, additional functional criteria and insurance authorization.   Follow Up Recommendations  No OT follow up     Assistance Recommended at Discharge PRN  Patient can return home with the following A little help with bathing/dressing/bathroom    Functional Status Assessment  Patient has not had a recent decline in their functional status  Equipment Recommendations  BSC/3in1       Precautions / Restrictions Precautions Precautions: Fall;Knee Precaution Booklet Issued: Yes (comment) Restrictions Weight Bearing Restrictions: Yes RLE Weight Bearing: Weight bearing as tolerated      Mobility Bed Mobility Overal bed mobility: Modified Independent                  Transfers Overall transfer level: Needs assistance Equipment  used: Rolling walker (2 wheels) Transfers: Sit to/from Stand Sit to Stand: Supervision                  Balance Overall balance assessment: Needs assistance Sitting-balance support: No upper extremity supported, Feet supported Sitting balance-Leahy Scale: Normal Sitting balance - Comments: steady sitting reaching outside BOS   Standing balance support: Bilateral upper extremity supported, During functional activity, Reliant on assistive device for balance Standing balance-Leahy Scale: Good Standing balance comment: steady ambulating with walker                           ADL either performed or assessed with clinical judgement   ADL Overall ADL's : Needs assistance/impaired                     Lower Body Dressing: Minimal assistance;Sit to/from stand Lower Body Dressing Details (indicate cue type and reason): Min A to don B shoes and socks. Sit <>stand with supervision and pt threads underwear and pants without assistance                     Vision Patient Visual Report: No change from baseline              Pertinent Vitals/Pain Pain Assessment Pain Assessment: 0-10 Pain Score: 3  Pain Location: R knee Pain Descriptors / Indicators: Aching, Tender, Sore Pain Intervention(s): Monitored during session, Premedicated before session     Hand Dominance Right   Extremity/Trunk Assessment Upper Extremity Assessment Upper Extremity Assessment: Overall WFL for tasks assessed  Lower Extremity Assessment Lower Extremity Assessment: Defer to PT evaluation RLE Deficits / Details: able to perform R LE SLR independently; at least 3/5 AROM hip flexion and ankle DF/PF RLE: Unable to fully assess due to pain   Cervical / Trunk Assessment Cervical / Trunk Assessment: Normal   Communication Communication Communication: No difficulties   Cognition Arousal/Alertness: Awake/alert Behavior During Therapy: WFL for tasks assessed/performed Overall  Cognitive Status: Within Functional Limits for tasks assessed                                                  Home Living Family/patient expects to be discharged to:: Private residence Living Arrangements: Children Available Help at Discharge: Family;Available 24 hours/day Type of Home: House Home Access: Ramped entrance     Home Layout: One level     Bathroom Shower/Tub: Tub/shower unit;Walk-in shower   Bathroom Toilet: Handicapped height     Home Equipment: Grab bars - tub/shower;Shower seat - built in;Grab bars - Academic librarian (2 wheels);Rollator (4 wheels);Cane - single point;Wheelchair - manual          Prior Functioning/Environment Prior Level of Function : Independent/Modified Independent             Mobility Comments: Occasional use of SPC; no recent falls. ADLs Comments: Mod I for self care and IADLs                 OT Goals(Current goals can be found in the care plan section) Acute Rehab OT Goals Patient Stated Goal: to go home OT Goal Formulation: With patient Time For Goal Achievement: 11/16/22 Potential to Achieve Goals: Good  OT Frequency:         AM-PAC OT "6 Clicks" Daily Activity     Outcome Measure Help from another person eating meals?: None Help from another person taking care of personal grooming?: None Help from another person toileting, which includes using toliet, bedpan, or urinal?: None Help from another person bathing (including washing, rinsing, drying)?: None Help from another person to put on and taking off regular upper body clothing?: None Help from another person to put on and taking off regular lower body clothing?: A Little 6 Click Score: 23   End of Session Nurse Communication: Mobility status  Activity Tolerance: Patient tolerated treatment well Patient left: in bed;with call bell/phone within reach;with family/visitor present                   Time: 3244-0102 OT Time Calculation  (min): 18 min Charges:  OT General Charges $OT Visit: 1 Visit OT Evaluation $OT Eval Low Complexity: 1 Low $OT Eval Moderate Complexity: 1 Mod OT Treatments $Self Care/Home Management : 8-22 mins  Darleen Crocker, MS, OTR/L , CBIS ascom [phone]  11/16/22, 10:36 AM

## 2022-11-16 NOTE — Progress Notes (Signed)
  Subjective: 1 Day Post-Op Procedure(s) (LRB): COMPUTER ASSISTED TOTAL KNEE ARTHROPLASTY (Right) Patient reports pain as mild.   Patient is well, and has had no acute complaints or problems Plan is to go Home after hospital stay. Negative for chest pain and shortness of breath Fever: no Gastrointestinal: Negative for nausea and vomiting  Objective: Vital signs in last 24 hours: Temp:  [97.5 F (36.4 C)-98.6 F (37 C)] 97.9 F (36.6 C) (01/16 0000) Pulse Rate:  [60-82] 62 (01/16 0000) Resp:  [13-17] 14 (01/16 0000) BP: (108-141)/(40-66) 124/56 (01/16 0000) SpO2:  [95 %-100 %] 97 % (01/16 0000) Weight:  [71 kg] 71 kg (01/15 1557)  Intake/Output from previous day:  Intake/Output Summary (Last 24 hours) at 11/16/2022 0721 Last data filed at 11/16/2022 0700 Gross per 24 hour  Intake 3708.88 ml  Output 1680 ml  Net 2028.88 ml    Intake/Output this shift: No intake/output data recorded.  Labs: No results for input(s): "HGB" in the last 72 hours. No results for input(s): "WBC", "RBC", "HCT", "PLT" in the last 72 hours. No results for input(s): "NA", "K", "CL", "CO2", "BUN", "CREATININE", "GLUCOSE", "CALCIUM" in the last 72 hours. No results for input(s): "LABPT", "INR" in the last 72 hours.   EXAM General - Patient is Alert and Oriented Extremity - Neurovascular intact Sensation intact distally Dorsiflexion/Plantar flexion intact Compartment soft Dressing/Incision - clean, dry, with the Hemovac tubing removed with no complication.  The Hemovac tubing was intact on removal. Motor Function - intact, moving foot and toes well on exam.  Able to straight leg raise independently.  Past Medical History:  Diagnosis Date   Anxiety    Arthritis    Cataract cortical, senile    Cellulitis due to MRSA    right leg (hospitalized [facility])   Chronic pain in right foot    Colovaginal fistula    Diverticulosis    Fibrocystic breast    Fibromuscular dysplasia (HCC)    GERD  (gastroesophageal reflux disease)    Heart murmur    mild   History of blood transfusion 1998   slight rash with   History of hypertension    off all meds for last 4 years   History of methicillin resistant staphylococcus aureus (MRSA)    History of stomach ulcers    Hypertension    Lichen sclerosus of female genitalia    Migraines    Mild cognitive impairment    PONV (postoperative nausea and vomiting)    just once -takes alot to sedate patient   Stroke [facility]) 2022   Thyroid nodule    TIA (transient ischemic attack) 2022   Dr Manuella Ghazi    Assessment/Plan: 1 Day Post-Op Procedure(s) (LRB): COMPUTER ASSISTED TOTAL KNEE ARTHROPLASTY (Right) Principal Problem:   Total knee replacement status  Estimated body mass index is 30.57 kg/m as calculated from the following:   Height as of this encounter: 5' (1.524 m).   Weight as of this encounter: 71 kg. Advance diet Up with therapy D/C IV fluids Discharge home with home health  DVT Prophylaxis - Lovenox, Foot Pumps, and TED hose Weight-Bearing as tolerated to right leg  Reche Dixon, PA-C Orthopaedic Surgery 11/16/2022, 7:21 AM

## 2022-11-17 ENCOUNTER — Encounter: Payer: Self-pay | Admitting: Orthopedic Surgery

## 2022-12-28 ENCOUNTER — Other Ambulatory Visit: Payer: Self-pay | Admitting: Orthopedic Surgery

## 2022-12-28 ENCOUNTER — Ambulatory Visit
Admission: RE | Admit: 2022-12-28 | Discharge: 2022-12-28 | Disposition: A | Discharge status: Home / Self Care | Payer: Medicare HMO | Source: Ambulatory Visit | Attending: Orthopedic Surgery | Admitting: Orthopedic Surgery

## 2022-12-28 DIAGNOSIS — M79661 Pain in right lower leg: Secondary | ICD-10-CM | POA: Diagnosis present

## 2022-12-28 DIAGNOSIS — Z96651 Presence of right artificial knee joint: Secondary | ICD-10-CM

## 2023-01-22 ENCOUNTER — Ambulatory Visit
Admission: RE | Admit: 2023-01-22 | Discharge: 2023-01-22 | Disposition: A | Discharge status: Home / Self Care | Payer: Medicare HMO | Source: Ambulatory Visit | Attending: Emergency Medicine | Admitting: Emergency Medicine

## 2023-01-22 ENCOUNTER — Ambulatory Visit (INDEPENDENT_AMBULATORY_CARE_PROVIDER_SITE_OTHER): Payer: Medicare HMO

## 2023-01-22 VITALS — BP 129/76 | HR 62 | Temp 97.9°F | Resp 16 | Ht 60.0 in | Wt 145.0 lb

## 2023-01-22 DIAGNOSIS — M1811 Unilateral primary osteoarthritis of first carpometacarpal joint, right hand: Secondary | ICD-10-CM

## 2023-01-22 MED ORDER — MELOXICAM 7.5 MG PO TABS: 7.5000 mg | ORAL_TABLET | Freq: Every day | ORAL | 0 refills | Status: DC

## 2023-01-22 NOTE — ED Provider Notes (Signed)
[facility]    CSN: [id number] Arrival date & time: 01/22/23  1021      History   Chief Complaint Chief Complaint  Patient presents with   Thumb Pain    Appt    HPI Carrie [name] is a 77 y.o. female.   HPI  77 year old female with a past medical history significant for hypertension, anxiety, heart murmur, GERD, and arthritis presents for evaluation of pain in her right thumb x 2 weeks.  No known injury.  The patient reports that her pain is worse when she bends her finger.  Past Medical History:  Diagnosis Date   Anxiety    Arthritis    Cataract cortical, senile    Cellulitis due to MRSA    right leg (hospitalized [facility])   Chronic pain in right foot    Colovaginal fistula    Diverticulosis    Fibrocystic breast    Fibromuscular dysplasia (HCC)    GERD (gastroesophageal reflux disease)    Heart murmur    mild   History of blood transfusion 1998   slight rash with   History of hypertension    off all meds for last 4 years   History of methicillin resistant staphylococcus aureus (MRSA)    History of stomach ulcers    Hypertension    Lichen sclerosus of female genitalia    Migraines    Mild cognitive impairment    PONV (postoperative nausea and vomiting)    just once -takes alot to sedate patient   Stroke ([place]) 2022   Thyroid nodule    TIA (transient ischemic attack) 2022   Dr Manuella Ghazi    Patient Active Problem List   Diagnosis Date Noted   Total knee replacement status 11/15/2022   OSA (obstructive sleep apnea) 05/24/2022   Fibromuscular dysplasia of carotid artery ([place]) 12/01/2021   Cervical disc disease 10/27/2021   Cervical myelopathy ([place]) 09/21/2021   Primary osteoarthritis of right knee 06/01/2020   Status post total left knee replacement 03/02/2020   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   Migraine 11/25/2019   Thyroid disease 11/25/2019   B12 deficiency 05/03/2018   Essential hypertension 09/17/2014   Pure hypercholesterolemia 09/17/2014    Nonspecific (abnormal) findings on radiological and other examination of gastrointestinal tract 02/22/2013    Past Surgical History:  Procedure Laterality Date   ABDOMINAL HYSTERECTOMY  1998   benign breast tumor removed Left 1967   BREAST EXCISIONAL BIOPSY Bilateral 1967 for left, 2010 for right   benign   cataract surgery  8 yrs ago   both eyes   COLON RESECTION  2015   anterior sigmoid   COLON SURGERY     8"colon removed-fistula   COLONOSCOPY WITH PROPOFOL N/A 05/30/2015   Procedure: COLONOSCOPY WITH PROPOFOL;  Surgeon: Hulen Luster, MD;  Location: [facility] ENDOSCOPY;  Service: Gastroenterology;  Laterality: N/A;   EUS N/A 02/22/2013   Procedure: UPPER ENDOSCOPIC ULTRASOUND (EUS) LINEAR;  Surgeon: Milus Banister, MD;  Location: [facility] ENDOSCOPY;  Service: Endoscopy;  Laterality: N/A;   excision cyst index finger Left    extensor tendon and extensor retinacular release   EYE SURGERY Bilateral    cataract   KNEE ARTHROPLASTY Left 11/26/2019   Procedure: COMPUTER ASSISTED TOTAL KNEE ARTHROPLASTY;  Surgeon: Dereck Leep, MD;  Location: [facility] ORS;  Service: Orthopedics;  Laterality: Left;   KNEE ARTHROPLASTY Right 11/15/2022   Procedure: COMPUTER ASSISTED TOTAL KNEE ARTHROPLASTY;  Surgeon: Dereck Leep, MD;  Location: [facility] ORS;  Service:  Orthopedics;  Laterality: Right;   POSTERIOR CERVICAL LAMINECTOMY N/A 09/21/2021   Procedure: C4-6 LAMINECTOMY;  Surgeon: Deetta Perla, MD;  Location: [facility] ORS;  Service: Neurosurgery;  Laterality: N/A;   right benign breast lump removed  2009   tummy tuck     WRIST GANGLION EXCISION Left     OB History   No obstetric history on file.      Home Medications    Prior to Admission medications   Medication Sig Start Date End Date Taking? Authorizing Provider  amoxicillin (AMOXIL) 500 MG tablet Take 500 mg by mouth as needed (for dental work).   Yes [provider]  aspirin EC 81 MG tablet Take 1 tablet by mouth daily. 02/05/21  Yes [provider]  atorvastatin (LIPITOR) 40 MG tablet Take 40 mg by mouth at bedtime. 04/30/21  Yes [provider]  Cyanocobalamin (VITAMIN B-12 PO) Take 2,500 mcg by mouth daily.   Yes [provider]  diphenhydrAMINE HCl (ZZZQUIL) 50 MG/30ML LIQD Take 30 mLs by mouth at bedtime as needed (sleep).   Yes [provider]  donepezil (ARICEPT) 5 MG tablet Take 5 mg by mouth at bedtime.   Yes [provider]  gabapentin (NEURONTIN) 100 MG capsule Take 200 mg by mouth 3 (three) times daily. 07/01/21  Yes [provider]  losartan (COZAAR) 50 MG tablet Take 50 mg by mouth every morning. 07/14/21  Yes [provider]  magnesium oxide (MAG-OX) 400 MG tablet Take 400 mg by mouth daily.   Yes [provider]  Melatonin 5 MG CAPS Take 5 mg by mouth at bedtime as needed (sleep).   Yes [provider]  meloxicam (MOBIC) 7.5 MG tablet Take 1 tablet (7.5 mg total) by mouth daily. 01/22/23  Yes Margarette Canada, NP  Multiple Vitamins-Minerals (MULTIVITAMIN WITH MINERALS) tablet Take 1 tablet by mouth daily. 50 +   Yes [provider]  oxyCODONE (OXY IR/ROXICODONE) 5 MG immediate release tablet Take 1 tablet (5 mg total) by mouth every 4 (four) hours as needed for moderate pain (pain score 4-6). 11/16/22  Yes Reche Dixon, PA-C  polyethylene glycol (MIRALAX / GLYCOLAX) 17 g packet Take 17 g by mouth daily.   Yes [provider]  senna (SENOKOT) 8.6 MG TABS tablet Take 1 tablet (8.6 mg total) by mouth 2 (two) times daily. 09/22/21  Yes Loleta Dicker, PA  traMADol (ULTRAM) 50 MG tablet Take 1-2 tablets (50-100 mg total) by mouth every 4 (four) hours as needed for moderate pain. 11/16/22  Yes Reche Dixon, PA-C  zolmitriptan (ZOMIG-ZMT) 5 MG disintegrating tablet Take 5 mg by mouth as needed for migraine. 05/19/20  Yes [provider]  enoxaparin (LOVENOX) 40 MG/0.4ML injection Inject 0.4 mLs (40 mg total) into the skin daily for  14 days. 11/16/22 11/30/22  Reche Dixon, PA-C    Family History Family History  Problem Relation Age of Onset   Breast cancer Maternal Aunt 51    Social History Social History   Tobacco Use   Smoking status: Never   Smokeless tobacco: Never  Vaping Use   Vaping Use: Never used  Substance Use Topics   Alcohol use: No   Drug use: No     Allergies   Pneumococcal polysaccharide vaccine   Review of Systems Review of Systems  Musculoskeletal:  Positive for arthralgias. Negative for joint swelling.  Skin:  Negative for color change.     Physical Exam Triage Vital Signs ED Triage  Vitals  Enc Vitals Group     BP 01/22/23 1034 129/76     Pulse Rate 01/22/23 1034 62     Resp 01/22/23 1034 16     Temp 01/22/23 1034 97.9 F (36.6 C)     Temp Source 01/22/23 1034 Oral     SpO2 01/22/23 1034 95 %     Weight 01/22/23 1033 145 lb (65.8 kg)     Height 01/22/23 1033 5' (1.524 m)     Head Circumference --      Peak Flow --      Pain Score 01/22/23 1032 6     Pain Loc --      Pain Edu? --      Excl. in [place]? --    No data found.  Updated Vital Signs BP 129/76 (BP Location: Right Arm)   Pulse 62   Temp 97.9 F (36.6 C) (Oral)   Resp 16   Ht 5' (1.524 m)   Wt 145 lb (65.8 kg)   SpO2 95%   BMI 28.32 kg/m   Visual Acuity Right Eye Distance:   Left Eye Distance:   Bilateral Distance:    Right Eye Near:   Left Eye Near:    Bilateral Near:     Physical Exam Vitals and nursing note reviewed.  Constitutional:      Appearance: Normal appearance. She is not ill-appearing.  Musculoskeletal:        General: Tenderness present. No swelling, deformity or signs of injury. Normal range of motion.  Skin:    General: Skin is warm and dry.     Capillary Refill: Capillary refill takes less than 2 seconds.  Neurological:     Mental Status: She is alert.      UC Treatments / Results  Labs (all labs ordered are listed, but only abnormal results are displayed) Labs  Reviewed - No data to display  EKG   Radiology DG Finger Thumb Right  Result Date: 01/22/2023 CLINICAL DATA:  Right thumb pain for 2 weeks without known injury. EXAM: RIGHT THUMB 2+V COMPARISON:  None Available. FINDINGS: There is no evidence of fracture or dislocation. Moderate degenerative changes seen involving the first carpometacarpal joint. Mild osteophyte formation is seen involving the first interphalangeal joint. Soft tissues are unremarkable. IMPRESSION: Degenerative changes as described above.  No acute abnormality seen. Electronically Signed   By: Marijo Conception M.D.   On: 01/22/2023 11:00    Procedures Procedures (including critical care time)  Medications Ordered in UC Medications - No data to display  Initial Impression / Assessment and Plan / UC Course  I have reviewed the triage vital signs and the nursing notes.  Pertinent labs & imaging results that were available during my care of the patient were reviewed by me and considered in my medical decision making (see chart for details).   Patient is a pleasant, nontoxic-appearing 77 year old female presenting for evaluation of pain in her right thumb x 2 weeks.  She states that when she flexes her thumb she will feel a pop and click in her joint.  This is when she mostly has the pain.  She does not member any discrete injury or precipitating event.  She does have a significant history of arthritis.  Radiograph of the right thumb was collected at triage and shows significant spurring and degenerative changes to the distal phalanx at the IP joint.  No other evidence of fracture or dislocation.  Radiology impression states that there is  no evidence of fracture or dislocation but there are mild degenerative changes seen in the first carpometacarpal joint.  Mild osteophyte formation is seen involving the first interphalangeal joint.  Soft tissues are unremarkable.  I will discharge patient home on meloxicam 7.5 mg daily to help  with pain.  She has been a patient of [facility], and they are her primary care provider, I have advised her to make an appointment with clinical clinic [facility] for further evaluation and treatment options.   Final Clinical Impressions(s) / UC Diagnoses   Final diagnoses:  Degenerative arthritis of thumb, right     Discharge Instructions      As we discussed you have degenerative arthritis in your right thumb as well as a bone spur at the first joint your thumb where you are having majority of your pain.  Start taking the meloxicam 7.5 mg once daily to help with pain and inflammation.  Since you have been a patient of [facility] I would call the Ortho clinic and request an appointment to see one of the providers to discuss other treatment options for the pain in your thumb.     ED Prescriptions     Medication Sig Dispense Auth. Provider   meloxicam (MOBIC) 7.5 MG tablet Take 1 tablet (7.5 mg total) by mouth daily. 30 tablet Margarette Canada, NP      PDMP not reviewed this encounter.   Margarette Canada, NP 01/22/23 [phone]

## 2023-01-22 NOTE — Discharge Instructions (Addendum)
As we discussed you have degenerative arthritis in your right thumb as well as a bone spur at the first joint your thumb where you are having majority of your pain.  Start taking the meloxicam 7.5 mg once daily to help with pain and inflammation.  Since you have been a patient of [facility] I would call the Ortho clinic and request an appointment to see one of the providers to discuss other treatment options for the pain in your thumb.

## 2023-01-22 NOTE — ED Triage Notes (Signed)
Pt c/o R thumb pain x14 days. Denies any injury, pain started randomly.  Denies any hx of arthritis. Pain worse when bending finger.

## 2023-06-13 ENCOUNTER — Other Ambulatory Visit: Payer: Self-pay | Admitting: Student

## 2023-06-13 DIAGNOSIS — G959 Disease of spinal cord, unspecified: Secondary | ICD-10-CM

## 2023-06-27 ENCOUNTER — Encounter: Payer: Self-pay | Admitting: Student

## 2023-06-29 ENCOUNTER — Encounter: Payer: Self-pay | Admitting: Student

## 2023-07-06 ENCOUNTER — Inpatient Hospital Stay: Admission: RE | Admit: 2023-07-06 | Payer: Medicare HMO | Source: Ambulatory Visit

## 2023-07-06 ENCOUNTER — Ambulatory Visit
Admission: RE | Admit: 2023-07-06 | Discharge: 2023-07-06 | Disposition: A | Discharge status: Home / Self Care | Payer: Medicare HMO | Source: Ambulatory Visit | Attending: Student | Admitting: Student

## 2023-07-06 DIAGNOSIS — G959 Disease of spinal cord, unspecified: Secondary | ICD-10-CM

## 2023-07-14 ENCOUNTER — Other Ambulatory Visit (INDEPENDENT_AMBULATORY_CARE_PROVIDER_SITE_OTHER): Payer: Self-pay | Admitting: Nurse Practitioner

## 2023-07-14 DIAGNOSIS — I773 Arterial fibromuscular dysplasia: Secondary | ICD-10-CM

## 2023-07-21 ENCOUNTER — Ambulatory Visit (INDEPENDENT_AMBULATORY_CARE_PROVIDER_SITE_OTHER): Payer: Medicare HMO

## 2023-07-21 ENCOUNTER — Ambulatory Visit (INDEPENDENT_AMBULATORY_CARE_PROVIDER_SITE_OTHER): Payer: Medicare HMO | Admitting: Vascular Surgery

## 2023-07-21 DIAGNOSIS — I773 Arterial fibromuscular dysplasia: Secondary | ICD-10-CM | POA: Diagnosis not present

## 2023-07-27 ENCOUNTER — Other Ambulatory Visit: Payer: Self-pay | Admitting: Internal Medicine

## 2023-07-27 DIAGNOSIS — Z1231 Encounter for screening mammogram for malignant neoplasm of breast: Secondary | ICD-10-CM

## 2023-09-14 ENCOUNTER — Ambulatory Visit
Admission: RE | Admit: 2023-09-14 | Discharge: 2023-09-14 | Disposition: A | Discharge status: Home / Self Care | Payer: Medicare HMO | Source: Ambulatory Visit | Attending: Internal Medicine | Admitting: Internal Medicine

## 2023-09-14 DIAGNOSIS — Z1231 Encounter for screening mammogram for malignant neoplasm of breast: Secondary | ICD-10-CM | POA: Insufficient documentation

## 2023-09-20 ENCOUNTER — Other Ambulatory Visit: Payer: Self-pay | Admitting: Internal Medicine

## 2023-09-20 DIAGNOSIS — R928 Other abnormal and inconclusive findings on diagnostic imaging of breast: Secondary | ICD-10-CM

## 2023-09-23 ENCOUNTER — Ambulatory Visit
Admission: RE | Admit: 2023-09-23 | Discharge: 2023-09-23 | Disposition: A | Discharge status: Home / Self Care | Payer: Medicare HMO | Source: Ambulatory Visit | Attending: Internal Medicine | Admitting: Internal Medicine

## 2023-09-23 DIAGNOSIS — R928 Other abnormal and inconclusive findings on diagnostic imaging of breast: Secondary | ICD-10-CM | POA: Diagnosis present

## 2023-12-19 NOTE — Progress Notes (Unsigned)
Referring Physician:  No referring provider defined for this encounter.  Primary Physician:  Lynnea Ferrier, MD  History of Present Illness: 12/20/2023 [name] is a 78 year old with a history of hypertension, hyperlipidemia, knee replacement, mild cognitive impairment, cervical myelopathy, who is here today with a chief complaint of ongoing neck pain, trouble with her balance, and decreased grip strength in her left hand.  Symptoms are largely present before and after her recent surgery but have worsened over the last several months.  She also describes circumferential numbness in her feet bilaterally.  She denies any low back pain or radiating arm or leg pain.  She does note some pain at her left wrist.  Her symptoms are largely constant but they do wax and wane in severity.  She feels as though they are worse when laying down to go to bed at night. She also notes tension type headaches intermittently.   Conservative measures: EMG on 06/20/23  Physical therapy: no recent therapy for her neck Injections: no epidural steroid injections  Past Surgery:  09/21/21: C4-6 Laminectomy by Dr. Rosine Door has symptoms of cervical myelopathy.  The symptoms are causing a significant impact on the patient's life.   Review of Systems:  A 10 point review of systems is negative, except for the pertinent positives and negatives detailed in the HPI.  Past Medical History: Past Medical History:  Diagnosis Date   Anxiety    Arthritis    Cataract cortical, senile    Cellulitis due to MRSA    right leg (hospitalized [facility])   Chronic pain in right foot    Colovaginal fistula    Diverticulosis    Fibrocystic breast    Fibromuscular dysplasia (HCC)    GERD (gastroesophageal reflux disease)    Heart murmur    mild   History of blood transfusion 1998   slight rash with   History of hypertension    off all meds for last 4 years   History of methicillin resistant staphylococcus  aureus (MRSA)    History of stomach ulcers    Hypertension    Lichen sclerosus of female genitalia    Migraines    Mild cognitive impairment    PONV (postoperative nausea and vomiting)    just once -takes alot to sedate patient   Stroke [facility]) 2022   Thyroid nodule    TIA (transient ischemic attack) 2022   Dr Sherryll Burger    Past Surgical History: Past Surgical History:  Procedure Laterality Date   ABDOMINAL HYSTERECTOMY  1998   benign breast tumor removed Left 1967   BREAST EXCISIONAL BIOPSY Bilateral 1967 for left, 2010 for right   benign   cataract surgery  8 yrs ago   both eyes   COLON RESECTION  2015   anterior sigmoid   COLON SURGERY     8"colon removed-fistula   COLONOSCOPY WITH PROPOFOL N/A 05/30/2015   Procedure: COLONOSCOPY WITH PROPOFOL;  Surgeon: Wallace Cullens, MD;  Location: [facility] ENDOSCOPY;  Service: Gastroenterology;  Laterality: N/A;   EUS N/A 02/22/2013   Procedure: UPPER ENDOSCOPIC ULTRASOUND (EUS) LINEAR;  Surgeon: Rachael Fee, MD;  Location: [facility] ENDOSCOPY;  Service: Endoscopy;  Laterality: N/A;   excision cyst index finger Left    extensor tendon and extensor retinacular release   EYE SURGERY Bilateral    cataract   KNEE ARTHROPLASTY Left 11/26/2019   Procedure: COMPUTER ASSISTED TOTAL KNEE ARTHROPLASTY;  Surgeon: Donato Heinz, MD;  Location:  [facility] ORS;  Service: Orthopedics;  Laterality: Left;   KNEE ARTHROPLASTY Right 11/15/2022   Procedure: COMPUTER ASSISTED TOTAL KNEE ARTHROPLASTY;  Surgeon: Donato Heinz, MD;  Location: [facility] ORS;  Service: Orthopedics;  Laterality: Right;   POSTERIOR CERVICAL LAMINECTOMY N/A 09/21/2021   Procedure: C4-6 LAMINECTOMY;  Surgeon: Lucy Chris, MD;  Location: [facility] ORS;  Service: Neurosurgery;  Laterality: N/A;   right benign breast lump removed  2009   tummy tuck     WRIST GANGLION EXCISION Left     Allergies: Allergies as of 12/20/2023 - Review Complete 01/22/2023  Allergen Reaction Noted   Pneumococcal polysaccharide  vaccine Rash 09/29/2015    Medications: Outpatient Encounter Medications as of 12/20/2023  Medication Sig   amoxicillin (AMOXIL) 500 MG tablet Take 500 mg by mouth as needed (for dental work).   aspirin EC 81 MG tablet Take 1 tablet by mouth daily.   atorvastatin (LIPITOR) 40 MG tablet Take 40 mg by mouth at bedtime.   Cyanocobalamin (VITAMIN B-12 PO) Take 2,500 mcg by mouth daily.   diphenhydrAMINE HCl (ZZZQUIL) 50 MG/30ML LIQD Take 30 mLs by mouth at bedtime as needed (sleep).   donepezil (ARICEPT) 5 MG tablet Take 5 mg by mouth at bedtime.   enoxaparin (LOVENOX) 40 MG/0.4ML injection Inject 0.4 mLs (40 mg total) into the skin daily for 14 days.   gabapentin (NEURONTIN) 100 MG capsule Take 200 mg by mouth 3 (three) times daily.   losartan (COZAAR) 50 MG tablet Take 50 mg by mouth every morning.   magnesium oxide (MAG-OX) 400 MG tablet Take 400 mg by mouth daily.   Melatonin 5 MG CAPS Take 5 mg by mouth at bedtime as needed (sleep).   meloxicam (MOBIC) 7.5 MG tablet Take 1 tablet (7.5 mg total) by mouth daily.   Multiple Vitamins-Minerals (MULTIVITAMIN WITH MINERALS) tablet Take 1 tablet by mouth daily. 50 +   oxyCODONE (OXY IR/ROXICODONE) 5 MG immediate release tablet Take 1 tablet (5 mg total) by mouth every 4 (four) hours as needed for moderate pain (pain score 4-6).   polyethylene glycol (MIRALAX / GLYCOLAX) 17 g packet Take 17 g by mouth daily.   senna (SENOKOT) 8.6 MG TABS tablet Take 1 tablet (8.6 mg total) by mouth 2 (two) times daily.   traMADol (ULTRAM) 50 MG tablet Take 1-2 tablets (50-100 mg total) by mouth every 4 (four) hours as needed for moderate pain.   zolmitriptan (ZOMIG-ZMT) 5 MG disintegrating tablet Take 5 mg by mouth as needed for migraine.   No facility-administered encounter medications on file as of 12/20/2023.    Social History: Social History   Tobacco Use   Smoking status: Never   Smokeless tobacco: Never  Vaping Use   Vaping status: Never Used   Substance Use Topics   Alcohol use: No   Drug use: No    Family Medical History: Family History  Problem Relation Age of Onset   Breast cancer Maternal Aunt 50    Physical Examination: Today's Vitals   12/20/23 1035  BP: 130/84  Weight: 71.4 kg  Height: 5' (1.524 m)  PainSc: 5   PainLoc: Neck   Body mass index is 30.74 kg/m.  General: Patient is well developed, well nourished, calm, collected, and in no apparent distress. Attention to examination is appropriate.  Psychiatric: Patient is non-anxious.  Head:  Pupils equal, round, and reactive to light.  ENT:  Oral mucosa appears well hydrated.  Neck:   Supple.   Respiratory: Patient is breathing  without any difficulty.  Extremities: No edema.  Vascular: Palpable dorsal pedal pulses.  Skin:   On exposed skin, there are no abnormal skin lesions.  NEUROLOGICAL:     Awake, alert, oriented to person, place, and time.  Speech is clear and fluent. Fund of knowledge is appropriate.   Cranial Nerves: Pupils equal round and reactive to light.  Facial tone is symmetric.  Facial sensation is symmetric.  ROM of spine: limited ROM  Palpation of spine: non tender.    Strength: Side Biceps Triceps Deltoid Interossei Grip Wrist Ext. Wrist Flex.  R 5 5 5  4- 5 5 5   L 5 5 5  4- 4- 5 5   Side Iliopsoas Quads Hamstring PF DF EHL  R 5 5 5 5 5 5   L 5 5 5 5 5 5    Reflexes are 3+ and symmetric at the biceps, triceps, brachioradialis, patella and achilles.   Hoffman's is absent.  Clonus is not present.  Toes are down-going.  Decreased sensation to light touch in bilateral hands and feet. + Hoffman's bilaterally 1 beat of clonus bilaterally.   Medical Decision Making  Imaging: MRI C spine 07/06/23 IMPRESSION: 1. Interval laminectomies at C4, C5, and C6 with decompression of the central canal. 2. Stable residual moderate left and mild right foraminal stenosis at C6-7. 3. Stable moderate foraminal stenosis bilaterally at C5-6,  left greater than right. 4. Stable severe left and moderate right foraminal stenosis at C4-5. 5. Stable moderate left foraminal stenosis at C3-4. 6. Stable mild left foraminal stenosis at C2-3. 7. 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 The visualized spinal cord is otherwise normal.     Electronically Signed   By: Marin Roberts M.D.   On: 07/20/2023 15:52  Upper extremity emg/ncs 06/20/2023 - This is an abnormal electrodiagnostic exam consistent with 1)chronic C7 and C8 (C8 > C7) radiculopathy on the right and left. 2) Right minimal (grade I) carpal tunnel syndrome (median nerve entrapment at wrist)   I have personally reviewed the images and agree with the above interpretation.  Assessment and Plan: Ms. Sliva is a pleasant 78 y.o. female with Struve cervical myelopathy s/p cervical decompression by Dr. Adriana Simas in 2022.  While a lot of her symptoms are unchanged since her surgery, she has had worsening balance over the last several months, decreased grip strength in her left hand, and worsening numbness and tingling in her hands and feet bilaterally.  She does continue to have hyperreflexia on exam which I explained could be due to her history of cervical myelopathy as reflexes do not always return to normal versus severe compression in her thoracic spine however this would not explain the ongoing hyperreflexia in her upper extremities.  We will do thoracic and lumbar MRIs to rule out any pathology that explains her symptoms.  We reviewed her cervical MRI which does show diffuse foraminal stenosis however this is also bilaterally and does not seem to correlate with her notes and tingling in her hand as well as her grip strength weakness.  We briefly discussed attempting cervical injections but she would like to obtain her MRIs first.  I will also reach out to neurology as I do not think her current cervical MRI findings explain the majority of her symptoms.  We also briefly discussed  attempting physical therapy focusing on balance training but she would like to have her MRIs first.  She did mention that she has claustrophobia and will need medication for anxiety.  She will  call us once her MRIs are scheduled and we will place order for this.  Thank you for involving me in the care of this patient.   I spent a total of 53 minutes in both face-to-face and non-face-to-face activities for this visit on the date of this encounter including review of outside records, review of imaging, lengthy discussion regarding symptoms, discussion regarding differential diagnosis, image review, documentation, and order placement.   [facility]. of Neurosurgery

## 2023-12-20 ENCOUNTER — Ambulatory Visit: Payer: Medicare HMO | Admitting: Neurosurgery

## 2023-12-20 VITALS — BP 130/84 | Ht 60.0 in | Wt 157.4 lb

## 2023-12-20 DIAGNOSIS — R200 Anesthesia of skin: Secondary | ICD-10-CM | POA: Diagnosis not present

## 2023-12-20 DIAGNOSIS — R202 Paresthesia of skin: Secondary | ICD-10-CM | POA: Diagnosis not present

## 2023-12-20 DIAGNOSIS — R2689 Other abnormalities of gait and mobility: Secondary | ICD-10-CM | POA: Diagnosis not present

## 2024-01-04 ENCOUNTER — Other Ambulatory Visit: Payer: Self-pay | Admitting: Neurosurgery

## 2024-01-04 MED ORDER — LORAZEPAM 0.5 MG PO TABS: 0.5000 mg | ORAL_TABLET | Freq: Once | ORAL | 0 refills | Status: AC | PRN

## 2024-01-04 NOTE — Telephone Encounter (Signed)
 Patient left a message with the after hours answering service at 5:21pm 01/03/2024, asking for 2 Ativan.

## 2024-01-06 ENCOUNTER — Ambulatory Visit
Admission: RE | Admit: 2024-01-06 | Discharge: 2024-01-06 | Disposition: A | Discharge status: Home / Self Care | Payer: Medicare HMO | Source: Ambulatory Visit | Attending: Neurosurgery | Admitting: Neurosurgery

## 2024-01-06 DIAGNOSIS — R202 Paresthesia of skin: Secondary | ICD-10-CM | POA: Diagnosis present

## 2024-01-06 DIAGNOSIS — R200 Anesthesia of skin: Secondary | ICD-10-CM | POA: Diagnosis present

## 2024-02-16 ENCOUNTER — Ambulatory Visit: Admitting: Neurosurgery

## 2024-02-16 VITALS — BP 112/72 | Ht 60.0 in | Wt 157.0 lb

## 2024-02-16 DIAGNOSIS — R200 Anesthesia of skin: Secondary | ICD-10-CM | POA: Diagnosis not present

## 2024-02-16 DIAGNOSIS — R519 Headache, unspecified: Secondary | ICD-10-CM

## 2024-02-16 DIAGNOSIS — R202 Paresthesia of skin: Secondary | ICD-10-CM | POA: Diagnosis not present

## 2024-02-16 DIAGNOSIS — M48061 Spinal stenosis, lumbar region without neurogenic claudication: Secondary | ICD-10-CM

## 2024-02-16 DIAGNOSIS — M542 Cervicalgia: Secondary | ICD-10-CM

## 2024-02-16 NOTE — Progress Notes (Signed)
 Progress Note: Referring Physician:  No referring provider defined for this encounter.  Primary Physician:  Lynnea Ferrier, MD  Chief Complaint:  f/u to review MRI results   History of Present Illness: Carrie [name] is a 78 y.o. female who presents today for follow up to review her thoracic and lumbar MRIs. She denies any changes to her symptoms   12/20/2023 [name] is a 78 year old with a history of hypertension, hyperlipidemia, knee replacement, mild cognitive impairment, cervical myelopathy, who is here today with a chief complaint of ongoing neck pain, trouble with her balance, and decreased grip strength in her left hand.  Symptoms are largely present before and after her recent surgery but have worsened over the last several months.  She also describes circumferential numbness in her feet bilaterally.  She denies any low back pain or radiating arm or leg pain.  She does note some pain at her left wrist.  Her symptoms are largely constant but they do wax and wane in severity.  She feels as though they are worse when laying down to go to bed at night. She also notes tension type headaches intermittently.    Conservative measures: EMG on 06/20/23  Physical therapy: no recent therapy for her neck Injections: no epidural steroid injections   Past Surgery:  09/21/21: C4-6 Laminectomy by Dr. Rosine Door has symptoms of cervical myelopathy.   The symptoms are causing a significant impact on the patient's life.  Exam: There were no vitals filed for this visit. There is no height or weight on file to calculate BMI.    NEUROLOGICAL:  General: In no acute distress.   Awake, alert, oriented to person, place, and time.  Pupils equal round and reactive to light.  Facial tone is symmetric.   Strength: Side Biceps Triceps Deltoid Interossei Grip Wrist Ext. Wrist Flex.  R 5 5 5 5 5 5 5   L 5 5 5 5 5 5 5     3+ reflexes throughout  Imaging: MRI T spine 01/06/24    IMPRESSION: 1. No acute or traumatic finding. No cord compression or focal cord lesion. 2. Shallow disc protrusions at T5-6, T6-7 and T7-8 indenting the ventral subarachnoid space but not causing visible neural compression. 3. Mild facet osteoarthritis at T1-2 and T2-3.     Electronically Signed   By: Paulina Fusi M.D.   On: 02/04/2024 13:07  MRI L spine 01/06/24 IMPRESSION: 1. L1-2: Bulging of the disc more prominent towards the right. Facet and ligamentous hypertrophy more prominent on the right. Mild stenosis of the right lateral recess and intervertebral foramen on the right, but no definite neural compression. 2. L2-3: Endplate osteophytes and bulging of the disc. Facet and ligamentous hypertrophy. Moderate stenosis at this level with potential for neural compression in either or both lateral recesses. Bilateral foraminal narrowing but without definite compression of the exiting L2 nerves. 3. L3-4: Endplate osteophytes and bulging of the disc. Facet degeneration and hypertrophy worse on the left. Mild stenosis of the left lateral recess and intervertebral foramen on the left. Some potential for left-sided neural compression. 4. L4-5: Bilateral facet arthropathy with degenerative anterolisthesis of 2 mm. Mild bulging of the disc. Severe multifactorial spinal stenosis that could cause neural compression on either or both sides. 5. L5-S1: Mild bulging of the disc. Mild foraminal narrowing on the left with some potential to affect the exiting L5 nerve.     Electronically Signed   By: Loraine Leriche  Shogry M.D.   On: 02/04/2024 13:04  I have personally reviewed the images and agree with the above interpretation.  Assessment and Plan: Carrie [name] is a pleasant 78 y.o. female with a history of cervical myelopathy and ongoing headaches with associated neck and arm pain as well as continued numbness in her legs.  We discussed her MRI results which ruled out any thoracic spinal cord  compression.  She does have significant spinal stenosis particularly at L4-5 which could explain some of her lower extremity numbness however she does not have corresponding back or leg pain and is not interested in addressing this time.  We briefly discussed the utility of cervical epidural injections versus dry needling or trigger point injections however she does not feel her symptoms are significant enough to warrant this at this time.  We discussed return precautions.  I will see her going forward on an as-needed basis.  She expressed understanding and was in agreement with this plan.  I spent a total of 57 minutes in both face-to-face and non-face-to-face activities for this visit on the date of this encounter including review of records, review of imaging, discussion of symptoms, physical exam, and documentation  Carrie Karvonen PA-C Neurosurgery

## 2024-03-20 ENCOUNTER — Encounter (INDEPENDENT_AMBULATORY_CARE_PROVIDER_SITE_OTHER): Payer: Self-pay

## 2024-08-23 ENCOUNTER — Other Ambulatory Visit: Payer: Self-pay | Admitting: Internal Medicine

## 2024-08-23 DIAGNOSIS — Z1231 Encounter for screening mammogram for malignant neoplasm of breast: Secondary | ICD-10-CM

## 2024-09-25 ENCOUNTER — Ambulatory Visit
Admission: RE | Admit: 2024-09-25 | Discharge: 2024-09-25 | Disposition: A | Discharge status: Home / Self Care | Source: Ambulatory Visit | Attending: Internal Medicine | Admitting: Internal Medicine

## 2024-09-25 DIAGNOSIS — Z1231 Encounter for screening mammogram for malignant neoplasm of breast: Secondary | ICD-10-CM | POA: Insufficient documentation

## 2024-10-08 ENCOUNTER — Ambulatory Visit

## 2024-10-08 DIAGNOSIS — R41841 Cognitive communication deficit: Secondary | ICD-10-CM | POA: Insufficient documentation

## 2024-10-08 NOTE — Therapy (Signed)
 OUTPATIENT SPEECH LANGUAGE PATHOLOGY  EVALUATION   Patient Name: Carrie [name] MRN: [id number] DOB:11-Sep-1946, 78 y.o., female Today's Date: 10/08/2024  PCP: Ophelia Fernande MOULD, MD  REFERRING PROVIDER: Jannett Fairly, MD   End of Session - 10/08/24 1101     Visit Number 1    Number of Visits 1    SLP Start Time 1015    SLP Stop Time  1100    SLP Time Calculation (min) 45 min    Activity Tolerance Patient tolerated treatment well           Patient Active Problem List   Diagnosis Date Noted   Total knee replacement status 11/15/2022   OSA (obstructive sleep apnea) 05/24/2022   Fibromuscular dysplasia of carotid artery 12/01/2021   Cervical disc disease 10/27/2021   Cervical myelopathy (HCC) 09/21/2021   Primary osteoarthritis of right knee 06/01/2020   Status post total left knee replacement 03/02/2020   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   Migraine 11/25/2019   Thyroid  disease 11/25/2019   B12 deficiency 05/03/2018   Essential hypertension 09/17/2014   Pure hypercholesterolemia 09/17/2014   Nonspecific (abnormal) findings on radiological and other examination of gastrointestinal tract 02/22/2013    ONSET DATE: referral date 09/26/24   REFERRING DIAG: mild cognitive impairment  THERAPY DIAG:  Cognitive communication deficit  Rationale for Evaluation and Treatment Rehabilitation  SUBJECTIVE:   SUBJECTIVE STATEMENT: Pt alert, pleasant, and cooperative. Pt accompanied by: self  PERTINENT HISTORY: Pt is a 78 y.o female who presents for cognitive-communication evaluation in setting of mild cognitive impairment. 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 She performs ADLs and IADLs independently. Gradual decline in short-term memory and  word-finding. Negative ATN panel. Family history of dementia. On donepezil  without side effects. Interested in preventing progression to dementia.  PAIN:  Are you having pain? No   FALLS: Has patient fallen in last 6 months?  No  LIVING ENVIRONMENT: Lives with: daughter-in-law Lives in: House/apartment  PLOF:  Level of assistance: Independent with ADLs Employment: Agricultural Consultant work   PATIENT GOALS   did not state  OBJECTIVE:   COGNITIVE COMMUNICATION: Overall cognitive status: Within functional limits for tasks assessed  AUDITORY COMPREHENSION: Overall auditory comprehension: Appears intact   READING COMPREHENSION: Intact  EXPRESSION: verbal  VERBAL EXPRESSION: Level of generative/spontaneous verbalization: conversation Automatic speech: name: intact  Repetition: Appears intact Naming: Confrontation: WFL and Divergent: WFL Pragmatics: Appears intact  WRITTEN EXPRESSION: Dominant hand: right   Written expression: Appears intact  MOTOR SPEECH: Overall motor speech: Appears intact   ORAL MOTOR EXAMINATION: No functional deficits  STANDARDIZED ASSESSMENT:  Addenbrooke's Cognitive Examination - ACE III The Addenbrooke's Cognitive Examination-III (ACE-III) is a brief cognitive test that assesses five cognitive domains. The total score is 100 with higher scores indicating better cognitive functioning. Cut off scores of 88 and 82 are recommended for suspicion of dementia (88 has sensitivity of 1.00 and specificity of 0.96, 82 has sensitivity of 0.93 and specificity of 1.00). American Version A  Attention 17/18  Memory 25/26  Fluency 13/14  Language 25/26  Visuospatial 15/16  TOTAL ACE- III Score 95/100      TODAY'S TREATMENT:  Pt educated re: role of SLP, results of assessment, domains of cognition, cognitive changes with again (vs red flags for dementia), and  cognitive stimulating activities. Handouts provided.   PATIENT EDUCATION: Education details: as  above Person educated: Patient Education method: Chief Technology Officer Education comprehension: verbalized understanding  HOME EXERCISE PROGRAM:        N/A    GOALS: n/a ASSESSMENT:  CLINICAL IMPRESSION:  Patient is a 78 y.o. female who was seen today for cognitive-communication. Assessment completed via informal means and objective assessment (ACE-III). Pt scored 95/100 on ACE-III which is indicative of cognitive-communication WFL. Pt participated in discussion/education re: typical cognitive changes in the aging brain, red flags for dementia, and cognitive stimulating activities. No further ST services indicated at this time.     REHAB POTENTIAL: Good  PLAN: D/C ST    Delon Bangs, M.S., CCC-SLP Speech-Language Pathologist [place] [facility] [phone] [place])  [facility] [address] [place], [place], [address] Phone: [phone]   Fax:  [phone]

## 2024-10-11 ENCOUNTER — Ambulatory Visit

## 2024-10-15 ENCOUNTER — Ambulatory Visit

## 2024-10-17 ENCOUNTER — Ambulatory Visit

## 2024-10-23 ENCOUNTER — Ambulatory Visit

## 2024-10-30 ENCOUNTER — Ambulatory Visit

## 2024-11-05 ENCOUNTER — Ambulatory Visit

## 2024-11-08 ENCOUNTER — Ambulatory Visit

## 2024-11-12 ENCOUNTER — Ambulatory Visit

## 2024-11-15 ENCOUNTER — Ambulatory Visit
